# Patient Record
Sex: Female | Born: 1960 | Race: Black or African American | Hispanic: No | Marital: Married | Smoking: Current some day smoker
Health system: Southern US, Community
[De-identification: ages and names within clinical notes are randomized; demographics above are authoritative.]

## PROBLEM LIST (undated history)

## (undated) DIAGNOSIS — K921 Melena: Secondary | ICD-10-CM

## (undated) DIAGNOSIS — N259 Disorder resulting from impaired renal tubular function, unspecified: Secondary | ICD-10-CM

## (undated) DIAGNOSIS — E785 Hyperlipidemia, unspecified: Secondary | ICD-10-CM

## (undated) DIAGNOSIS — M5030 Other cervical disc degeneration, unspecified cervical region: Secondary | ICD-10-CM

## (undated) DIAGNOSIS — I10 Essential (primary) hypertension: Secondary | ICD-10-CM

## (undated) DIAGNOSIS — G4730 Sleep apnea, unspecified: Secondary | ICD-10-CM

## (undated) DIAGNOSIS — J019 Acute sinusitis, unspecified: Secondary | ICD-10-CM

## (undated) DIAGNOSIS — R740 Nonspecific elevation of levels of transaminase and lactic acid dehydrogenase [LDH]: Secondary | ICD-10-CM

## (undated) DIAGNOSIS — R609 Edema, unspecified: Secondary | ICD-10-CM

## (undated) DIAGNOSIS — N926 Irregular menstruation, unspecified: Secondary | ICD-10-CM

## (undated) DIAGNOSIS — M542 Cervicalgia: Secondary | ICD-10-CM

## (undated) DIAGNOSIS — N318 Other neuromuscular dysfunction of bladder: Secondary | ICD-10-CM

## (undated) DIAGNOSIS — F419 Anxiety disorder, unspecified: Secondary | ICD-10-CM

## (undated) HISTORY — DX: Nonspecific elevation of levels of transaminase and lactic acid dehydrogenase (ldh): R74.0

## (undated) HISTORY — DX: Acute sinusitis, unspecified: J01.90

## (undated) HISTORY — DX: Edema, unspecified: R60.9

## (undated) HISTORY — DX: Anxiety disorder, unspecified: F41.9

## (undated) HISTORY — DX: Hyperlipidemia, unspecified: E78.5

## (undated) HISTORY — DX: Melena: K92.1

## (undated) HISTORY — DX: Other neuromuscular dysfunction of bladder: N31.8

## (undated) HISTORY — DX: Irregular menstruation, unspecified: N92.6

## (undated) HISTORY — DX: Essential (primary) hypertension: I10

## (undated) HISTORY — DX: Disorder resulting from impaired renal tubular function, unspecified: N25.9

## (undated) HISTORY — DX: Other cervical disc degeneration, unspecified cervical region: M50.30

## (undated) HISTORY — DX: Cervicalgia: M54.2

---
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
## 1988-06-12 HISTORY — PX: TUBAL LIGATION: SHX77

## 2002-12-25 ENCOUNTER — Encounter: Payer: Self-pay | Admitting: Obstetrics

## 2002-12-25 ENCOUNTER — Ambulatory Visit (HOSPITAL_COMMUNITY): Admission: RE | Admit: 2002-12-25 | Discharge: 2002-12-25 | Payer: Self-pay | Admitting: Obstetrics

## 2005-08-24 ENCOUNTER — Ambulatory Visit: Payer: Self-pay | Admitting: Internal Medicine

## 2009-07-12 ENCOUNTER — Ambulatory Visit: Payer: Self-pay | Admitting: Internal Medicine

## 2009-07-12 ENCOUNTER — Ambulatory Visit (HOSPITAL_COMMUNITY): Admission: RE | Admit: 2009-07-12 | Discharge: 2009-07-12 | Payer: Self-pay | Admitting: Internal Medicine

## 2009-07-12 ENCOUNTER — Telehealth: Payer: Self-pay | Admitting: Internal Medicine

## 2009-07-12 ENCOUNTER — Encounter (INDEPENDENT_AMBULATORY_CARE_PROVIDER_SITE_OTHER): Payer: Self-pay | Admitting: *Deleted

## 2009-07-12 DIAGNOSIS — R740 Nonspecific elevation of levels of transaminase and lactic acid dehydrogenase [LDH]: Secondary | ICD-10-CM

## 2009-07-12 DIAGNOSIS — J019 Acute sinusitis, unspecified: Secondary | ICD-10-CM

## 2009-07-12 DIAGNOSIS — R7402 Elevation of levels of lactic acid dehydrogenase (LDH): Secondary | ICD-10-CM

## 2009-07-12 DIAGNOSIS — M542 Cervicalgia: Secondary | ICD-10-CM

## 2009-07-12 DIAGNOSIS — R7401 Elevation of levels of liver transaminase levels: Secondary | ICD-10-CM | POA: Insufficient documentation

## 2009-07-12 DIAGNOSIS — N318 Other neuromuscular dysfunction of bladder: Secondary | ICD-10-CM

## 2009-07-12 DIAGNOSIS — N259 Disorder resulting from impaired renal tubular function, unspecified: Secondary | ICD-10-CM

## 2009-07-12 DIAGNOSIS — I10 Essential (primary) hypertension: Secondary | ICD-10-CM | POA: Insufficient documentation

## 2009-07-12 HISTORY — DX: Elevation of levels of lactic acid dehydrogenase (LDH): R74.02

## 2009-07-12 HISTORY — DX: Other neuromuscular dysfunction of bladder: N31.8

## 2009-07-12 HISTORY — DX: Cervicalgia: M54.2

## 2009-07-12 HISTORY — DX: Essential (primary) hypertension: I10

## 2009-07-12 HISTORY — DX: Disorder resulting from impaired renal tubular function, unspecified: N25.9

## 2009-07-12 HISTORY — DX: Elevation of levels of liver transaminase levels: R74.01

## 2009-07-12 HISTORY — DX: Acute sinusitis, unspecified: J01.90

## 2009-07-13 ENCOUNTER — Encounter: Payer: Self-pay | Admitting: Internal Medicine

## 2009-07-13 DIAGNOSIS — M5030 Other cervical disc degeneration, unspecified cervical region: Secondary | ICD-10-CM

## 2009-07-13 HISTORY — DX: Other cervical disc degeneration, unspecified cervical region: M50.30

## 2009-07-14 ENCOUNTER — Encounter: Admission: RE | Admit: 2009-07-14 | Discharge: 2009-07-14 | Payer: Self-pay | Admitting: Internal Medicine

## 2009-07-14 ENCOUNTER — Telehealth: Payer: Self-pay | Admitting: Internal Medicine

## 2009-07-14 LAB — CONVERTED CEMR LAB
HCV Ab: NEGATIVE
Hep A IgM: NEGATIVE
Hep B C IgM: NEGATIVE
Hepatitis B Surface Ag: NEGATIVE

## 2009-07-15 ENCOUNTER — Ambulatory Visit: Payer: Self-pay | Admitting: Internal Medicine

## 2009-07-18 DIAGNOSIS — E785 Hyperlipidemia, unspecified: Secondary | ICD-10-CM

## 2009-07-18 HISTORY — DX: Hyperlipidemia, unspecified: E78.5

## 2009-08-24 ENCOUNTER — Ambulatory Visit: Payer: Self-pay | Admitting: Internal Medicine

## 2009-10-14 ENCOUNTER — Telehealth: Payer: Self-pay | Admitting: Internal Medicine

## 2009-10-26 ENCOUNTER — Telehealth (INDEPENDENT_AMBULATORY_CARE_PROVIDER_SITE_OTHER): Payer: Self-pay | Admitting: *Deleted

## 2009-10-29 ENCOUNTER — Telehealth (INDEPENDENT_AMBULATORY_CARE_PROVIDER_SITE_OTHER): Payer: Self-pay | Admitting: *Deleted

## 2009-11-04 ENCOUNTER — Telehealth (INDEPENDENT_AMBULATORY_CARE_PROVIDER_SITE_OTHER): Payer: Self-pay | Admitting: *Deleted

## 2009-11-30 ENCOUNTER — Ambulatory Visit: Payer: Self-pay | Admitting: Internal Medicine

## 2009-11-30 DIAGNOSIS — K921 Melena: Secondary | ICD-10-CM | POA: Insufficient documentation

## 2009-11-30 HISTORY — DX: Melena: K92.1

## 2010-01-28 ENCOUNTER — Encounter: Payer: Self-pay | Admitting: Internal Medicine

## 2010-07-10 LAB — CONVERTED CEMR LAB
ALT: 37 units/L — ABNORMAL HIGH (ref 0–35)
Albumin: 4.4 g/dL (ref 3.5–5.2)
Alkaline Phosphatase: 66 units/L (ref 39–117)
BUN: 6 mg/dL (ref 6–23)
Basophils Absolute: 0 10*3/uL (ref 0.0–0.1)
Bilirubin Urine: NEGATIVE
CO2: 29 meq/L (ref 19–32)
Calcium: 9.4 mg/dL (ref 8.4–10.5)
Eosinophils Absolute: 0.3 10*3/uL (ref 0.0–0.7)
Folate: 5.2 ng/mL
GFR calc non Af Amer: 34.38 mL/min (ref >60)
Glucose, Bld: 75 mg/dL (ref 70–99)
HCT: 32.4 % — ABNORMAL LOW (ref 36.0–46.0)
Iron: 52 ug/dL (ref 42–145)
Ketones, ur: NEGATIVE mg/dL
MCHC: 34.8 g/dL (ref 30.0–36.0)
Monocytes Absolute: 0.4 10*3/uL (ref 0.1–1.0)
Monocytes Relative: 9.7 % (ref 3.0–12.0)
Neutrophils Relative %: 49.2 % (ref 43.0–77.0)
RBC: 3.42 M/uL — ABNORMAL LOW (ref 3.87–5.11)
RDW: 13.6 % (ref 11.5–14.6)
Saturation Ratios: 10.6 % — ABNORMAL LOW (ref 20.0–50.0)
Total Bilirubin: 0.8 mg/dL (ref 0.3–1.2)
Total Protein, Urine: NEGATIVE mg/dL
Transferrin: 349.9 mg/dL (ref 212.0–360.0)
Urobilinogen, UA: 0.2 (ref 0.0–1.0)
WBC: 4.6 10*3/uL (ref 4.5–10.5)
pH: 5 (ref 5.0–8.0)

## 2010-07-14 NOTE — Assessment & Plan Note (Signed)
Summary: blood in stool/cd   Vital Signs:  Patient profile:   50 year old female Height:      64 inches Weight:      183 pounds BMI:     31.53 O2 Sat:      98 % on Room air Temp:     100.5 degrees F oral Pulse rate:   110 / minute BP sitting:   192 / 104  (left arm) Cuff size:   regular  Vitals Entered By: Margaret Pyle, CMA (November 30, 2009 3:01 PM)  O2 Flow:  Room air CC: Blood in stool x 9 days, no abd pain. Pain Assessment Patient in pain? no        CC:  Blood in stool x 9 days and no abd pain.Marland Kitchen  History of Present Illness: here with intermittent  BRBR , started with small spots to start, then somewhat larger amounts with BM's ; one BM "kind of loose" with more blood than stool;  no pain except last wk before onset BRB she had constipatoin relieved with laxativel did have to strain with BM at that time;  has been outside today with heat index of 105 ;  BP has been well controlled until this episode; Pt denies CP, sob, doe, wheezing, orthopnea, pnd, worsening LE edema, palps, dizziness or syncope   Pt denies new neuro symptoms such as headache, facial or extremity weakness Overall good compliance with meds, good med tolerance.    Problems Prior to Update: 1)  Hematochezia  (ICD-578.1) 2)  Hyperlipidemia  (ICD-272.4) 3)  Disc Disease, Cervical  (ICD-722.4) 4)  Transaminases, Serum, Elevated  (ICD-790.4) 5)  Renal Insufficiency  (ICD-588.9) 6)  Neck Pain  (ICD-723.1) 7)  Sinusitis- Acute-nos  (ICD-461.9) 8)  Preventive Health Care  (ICD-V70.0) 9)  Overactive Bladder  (ICD-596.51) 10)  Hypertension  (ICD-401.9)  Medications Prior to Update: 1)  Lisinopril 40 Mg Tabs (Lisinopril) .Marland Kitchen.. 1po Once Daily 2)  Amlodipine Besylate 5 Mg Tabs (Amlodipine Besylate) .Marland Kitchen.. 1 By Mouth Once Daily 3)  Tramadol Hcl 50 Mg Tabs (Tramadol Hcl) .Marland Kitchen.. 1 By Mouth Four Times Per Day As Needed 4)  Aspir-Low 81 Mg Tbec (Aspirin) .Marland Kitchen.. 1po Once Daily  Current Medications (verified): 1)   Lisinopril 40 Mg Tabs (Lisinopril) .Marland Kitchen.. 1po Once Daily 2)  Amlodipine Besylate 5 Mg Tabs (Amlodipine Besylate) .Marland Kitchen.. 1 By Mouth Once Daily 3)  Tramadol Hcl 50 Mg Tabs (Tramadol Hcl) .Marland Kitchen.. 1 By Mouth Four Times Per Day As Needed 4)  Aspir-Low 81 Mg Tbec (Aspirin) .Marland Kitchen.. 1po Once Daily 5)  Anusol-Hc 25 Mg Supp (Hydrocortisone Acetate) .Marland Kitchen.. 1 Pr Two Times A Day For 10 Days  Allergies (verified): No Known Drug Allergies  Past History:  Past Medical History: Last updated: 07/15/2009 Hypertension OAB Hyperlipidemia cervical disc disease Renal insufficiency  Past Surgical History: Last updated: 07/12/2009 s/p ganglion cyst mid 20's Tubal ligation  Social History: Last updated: 07/12/2009 work - CNA Former Smoker - none since 2002 Alcohol use-yes - social  Drug use-no Married 2 children  Risk Factors: Smoking Status: quit (07/12/2009)  Review of Systems       all otherwise negative per pt -    Physical Exam  General:  alert and overweight-appearing.   Head:  normocephalic and atraumatic.   Eyes:  vision grossly intact, pupils equal, and pupils round.   Ears:  R ear normal and L ear normal.   Nose:  no external deformity and no nasal discharge.   Mouth:  no gingival abnormalities and pharynx pink and moist.   Neck:  supple and no masses.   Lungs:  normal respiratory effort and normal breath sounds.   Heart:  normal rate and regular rhythm.   Rectal:  several hemorrhoidal tags, small fissure noted, no active bleeding, heme neg Extremities:  no edema, no erythema    Impression & Recommendations:  Problem # 1:  HEMATOCHEZIA (ICD-578.1)  painless but likely related to hemorroid vs fissure;  treat as above, f/u any worsening signs or symptoms , also for colonoscopy as malignancy cannot be ruled out  Orders: Gastroenterology Referral (GI)  Problem # 2:  HYPERTENSION (ICD-401.9)  Her updated medication list for this problem includes:    Lisinopril 40 Mg Tabs  (Lisinopril) .Marland Kitchen... 1po once daily    Amlodipine Besylate 5 Mg Tabs (Amlodipine besylate) .Marland Kitchen... 1 by mouth once daily overall  stable overall by hx and exam, ok to continue meds/tx as is   BP today: 192/104 Prior BP: 180/126 (08/24/2009)  Labs Reviewed: K+: 3.8 (07/12/2009) Creat: : 2.0 (07/12/2009)   Chol: 330 (07/12/2009)   HDL: 91.30 (07/12/2009)   TG: 205.0 (07/12/2009)  Complete Medication List: 1)  Lisinopril 40 Mg Tabs (Lisinopril) .Marland Kitchen.. 1po once daily 2)  Amlodipine Besylate 5 Mg Tabs (Amlodipine besylate) .Marland Kitchen.. 1 by mouth once daily 3)  Tramadol Hcl 50 Mg Tabs (Tramadol hcl) .Marland Kitchen.. 1 by mouth four times per day as needed 4)  Aspir-low 81 Mg Tbec (Aspirin) .Marland Kitchen.. 1po once daily 5)  Anusol-hc 25 Mg Supp (Hydrocortisone acetate) .Marland Kitchen.. 1 pr two times a day for 10 days  Patient Instructions: 1)  Please take all new medications as prescribed  2)  Continue all previous medications as before this visit  3)  You will be contacted about the referral(s) to: colonoscopy 4)  Please schedule a follow-up appointment in Jan 2012 with CPX labs 5)  Check your Blood Pressure regularly. If it is above 140/90: you should make an appointment. Prescriptions: ANUSOL-HC 25 MG SUPP (HYDROCORTISONE ACETATE) 1 pr two times a day for 10 days  #20 x 0   Entered and Authorized by:   Corwin Levins MD   Signed by:   Corwin Levins MD on 11/30/2009   Method used:   Print then Give to Patient   RxID:   [id number]

## 2010-07-14 NOTE — Assessment & Plan Note (Signed)
Summary: NECK IS SWOLLEN/ RE-EST/ [employer]/ LOV 2007 /NWS   Vital Signs:  Patient profile:   50 year old female Height:      66 inches Weight:      186 pounds BMI:     30.13 O2 Sat:      98 % on Room air Temp:     97.7 degrees F oral Pulse rate:   113 / minute BP sitting:   172 / 98  (left arm) Cuff size:   regular  Vitals Entered ByZella Ball Ewing (July 12, 2009 2:32 PM)  O2 Flow:  Room air  Preventive Care Screening     OK for flou shot  CC: new pt, swollen neck/RE   CC:  new pt and swollen neck/RE.  History of Present Illness: here with ongoing am dizziness and lightheaded for a 2 wks, c/o  sinus pain and has some discomfort and headache - has been ongoing for several months low grade and OTC helps only a day or two, now no longer working ; has fever, but no St, cough, Pt denies CP, sob, doe, wheezing, orthopnea, pnd, worsening LE edema, palps, dizziness or syncope  Pt denies new neuro symptoms such as facial or extremity weakness but has had RUE intermittent paresthesias  without weakness or pain.  May have some polyuria and notruai about 2 to 3 or more times at night.  No dysuria, and occasional slight wetting when cant get to the BR quickly enough.  Used to take med for OAB.  BP check last friday aproox 150 sbp.  Was on diovan 160 mg which did not control pressure in 2007.  Lost to f/u since then.     Preventive Screening-Counseling & Management  Alcohol-Tobacco     Smoking Status: quit      Drug Use:  no.    Problems Prior to Update: 1)  Transaminases, Serum, Elevated  (ICD-790.4) 2)  Renal Insufficiency  (ICD-588.9) 3)  Neck Pain  (ICD-723.1) 4)  Sinusitis- Acute-nos  (ICD-461.9) 5)  Preventive Health Care  (ICD-V70.0) 6)  Overactive Bladder  (ICD-596.51) 7)  Hypertension  (ICD-401.9)  Medications Prior to Update: 1)  None  Current Medications (verified): 1)  Doxycycline Hyclate 100 Mg Caps (Doxycycline Hyclate) .Marland Kitchen.. 1 Po Two Times A Day 2)  Benazepril Hcl 40  Mg Tabs (Benazepril Hcl) .Marland Kitchen.. 1 By Mouth Once Daily 3)  Amlodipine Besylate 5 Mg Tabs (Amlodipine Besylate) .Marland Kitchen.. 1 By Mouth Once Daily 4)  Vesicare 5 Mg Tabs (Solifenacin Succinate) .Marland Kitchen.. 1po Once Daily  Allergies (verified): No Known Drug Allergies  Past History:  Family History: Last updated: 07/12/2009 mother with HTN aunt with DM grandmother died with MI at 56yo  Social History: Last updated: 07/12/2009 work - CNA Former Smoker - none since 2002 Alcohol use-yes - social  Drug use-no Married 2 children  Risk Factors: Smoking Status: quit (07/12/2009)  Past Medical History: Hypertension OAB  Past Surgical History: s/p ganglion cyst mid 20's Tubal ligation  Family History: Reviewed history and no changes required. mother with HTN aunt with DM grandmother died with MI at 85yo  Social History: Reviewed history and no changes required. work - Lawyer Former Smoker - none since 2002 Alcohol use-yes - social  Drug use-no Married 2 children Smoking Status:  quit Drug Use:  no  Review of Systems  The patient denies anorexia, weight loss, weight gain, vision loss, decreased hearing, hoarseness, chest pain, syncope, dyspnea on exertion, peripheral edema, prolonged cough, hemoptysis, abdominal pain,  melena, hematochezia, severe indigestion/heartburn, hematuria, incontinence, muscle weakness, suspicious skin lesions, transient blindness, difficulty walking, unusual weight change, abnormal bleeding, enlarged lymph nodes, and angioedema.         all otherwise negative per pt -  Physical Exam  General:  alert and overweight-appearing.  , mild ill  Head:  normocephalic and atraumatic.   Eyes:  vision grossly intact, pupils equal, and pupils round.   Ears:  bilat tm's red, sinus tender bilat Nose:  nasal dischargemucosal pallor and mucosal erythema.   Mouth:  pharyngeal erythema and fair dentition.   Neck:  supple and cervical lymphadenopathy.   Lungs:  normal  respiratory effort and normal breath sounds.   Heart:  normal rate and regular rhythm.   Abdomen:  soft, non-tender, and normal bowel sounds.   Msk:  no joint tenderness and no joint swelling.   Extremities:  no edema, no erythema  Neurologic:  cranial nerves II-XII intact, strength normal in all extremities, and sensation intact to light touch.     Impression & Recommendations:  Problem # 1:  Preventive Health Care (ICD-V70.0)  Overall doing well, age appropriate education and counseling updated and referral for appropriate preventive services done unless declined, immunizations up to date or declined, diet counseling done if overweight, urged to quit smoking if smokes , most recent labs reviewed and current ordered if appropriate, ecg reviewed or declined (interpretation per ECG scanned in the EMR if done); information regarding Medicare Prevention requirements given if appropriate   Orders: TLB-BMP (Basic Metabolic Panel-BMET) (80048-METABOL) TLB-CBC Platelet - w/Differential (85025-CBCD) TLB-Hepatic/Liver Function Pnl (80076-HEPATIC) TLB-Lipid Panel (80061-LIPID) TLB-TSH (Thyroid Stimulating Hormone) (84443-TSH)  Problem # 2:  HYPERTENSION (ICD-401.9)  Her updated medication list for this problem includes:    Benazepril Hcl 40 Mg Tabs (Benazepril hcl) .Marland Kitchen... 1 by mouth once daily    Amlodipine Besylate 5 Mg Tabs (Amlodipine besylate) .Marland Kitchen... 1 by mouth once daily treat as above, f/u any worsening signs or symptoms , f/u 3 wks  Problem # 3:  OVERACTIVE BLADDER (ICD-596.51)  to  check urine studies, but to try vesicare 5 mg per day  Orders: T-Culture, Urine (33295-18841) TLB-Udip w/ Micro (81001-URINE)  Problem # 4:  SINUSITIS- ACUTE-NOS (ICD-461.9)  Orders: T-Sinuses Complete (70220TC)  Her updated medication list for this problem includes:    Doxycycline Hyclate 100 Mg Caps (Doxycycline hyclate) .Marland Kitchen... 1 po two times a day treat as above, f/u any worsening signs or  symptoms  Problem # 5:  NECK PAIN (ICD-723.1)  Has  rather large acute onset low post cervical spine swollen tender area about 4 cm, nondiscrete and no drainage, and few but significant symptoms of RUE paresthesias (? related to work); I suspect by exam more likelya  benign type of rather large cystic subq mass or lymph node swelling related to the acute infection above  but with tenderness, fever , obvious  site of obvious confirmed infection (sinus) on exam  as well as the intermittent RUE parethesias (although exam benign) she could potentially have early abscess paravertebral;  will ask for c-spine MRI asap to further assess;  pt to return immediately with any worsening s/s, or go to ER  Orders: Radiology Referral (Radiology)  Complete Medication List: 1)  Doxycycline Hyclate 100 Mg Caps (Doxycycline hyclate) .Marland Kitchen.. 1 po two times a day 2)  Benazepril Hcl 40 Mg Tabs (Benazepril hcl) .Marland Kitchen.. 1 by mouth once daily 3)  Amlodipine Besylate 5 Mg Tabs (Amlodipine besylate) .Marland Kitchen.. 1 by mouth once daily  4)  Vesicare 5 Mg Tabs (Solifenacin succinate) .Marland Kitchen.. 1po once daily  Other Orders: Admin 1st Vaccine (11914) Flu Vaccine 28yrs + [phone]) Tdap => 36yrs IM (62130) Admin of Any Addtl Vaccine (86578)  Patient Instructions: 1)  you had the flu shot today, and tetanus shots 2)  please call for your yearly pap smear and mammogram 3)  your EKG was good today 4)  Please go to the Lab in the basement for your blood and/or urine tests today 5)  Please go to Radiology in the basement level for your X-Ray today  6)  You will be contacted about the referral(s) to: MRI for the c-spine  - to see PCC's today 7)  Please take all new medications as prescribed 8)  Continue all previous medications as before this visit  9)  Followup immediately with any worsening signs such as increased pain, swelling, fever, chills, or extremity numb, weak, or pain, change in bowel or bladder or trouble walking 10)  otherwise - f/u in  3 wks for BP check Prescriptions: VESICARE 5 MG TABS (SOLIFENACIN SUCCINATE) 1po once daily  #90 x 3   Entered and Authorized by:   Corwin Levins MD   Signed by:   Corwin Levins MD on 07/12/2009   Method used:   Print then Give to Patient   RxID:   [id number] VESICARE 5 MG TABS (SOLIFENACIN SUCCINATE) 1po once daily  #30 x 11   Entered and Authorized by:   Corwin Levins MD   Signed by:   Corwin Levins MD on 07/12/2009   Method used:   Print then Give to Patient   RxID:   [id number] AMLODIPINE BESYLATE 5 MG TABS (AMLODIPINE BESYLATE) 1 by mouth once daily  #90 x 3   Entered and Authorized by:   Corwin Levins MD   Signed by:   Corwin Levins MD on 07/12/2009   Method used:   Print then Give to Patient   RxID:   [id number] BENAZEPRIL HCL 40 MG TABS (BENAZEPRIL HCL) 1 by mouth once daily  #90 x 3   Entered and Authorized by:   Corwin Levins MD   Signed by:   Corwin Levins MD on 07/12/2009   Method used:   Print then Give to Patient   RxID:   [id number] DOXYCYCLINE HYCLATE 100 MG CAPS (DOXYCYCLINE HYCLATE) 1 po two times a day  #20 x 0   Entered and Authorized by:   Corwin Levins MD   Signed by:   Corwin Levins MD on 07/12/2009   Method used:   Print then Give to Patient   RxID:   [id number]     Flu Vaccine Consent Questions     Do you have a history of severe allergic reactions to this vaccine? no    Any prior history of allergic reactions to egg and/or gelatin? no    Do you have a sensitivity to the preservative Thimersol? no    Do you have a past history of Guillan-Barre Syndrome? no    Do you currently have an acute febrile illness? no    Have you ever had a severe reaction to latex? no    Vaccine information given and explained to patient? yes    Are you currently pregnant? no    Lot Number:[id number]   Exp Date:12/09/2009   Site Given  Left Deltoid IMflu  Immunizations Administered:  Tetanus Vaccine:    Vaccine Type: Tdap  Site: left deltoid     Mfr: GlaxoSmithKline    Dose: 0.5 ml    Route: IM    Given by: Robin Ewing    Exp. Date: 08/07/2011    Lot #: [id number]    VIS given: 04/30/07 version given July 12, 2009.

## 2010-07-14 NOTE — Progress Notes (Signed)
  Phone Note Other Incoming   Request: Send information Summary of Call: Request for records received from [facility] [employer]. Request forwarded to [employer].

## 2010-07-14 NOTE — Progress Notes (Signed)
  Phone Note Other Incoming   Caller: Marcell Anger, with [facility] Request: Send information Action Taken: Information Sent Summary of Call: The requested and received records on the patient; however they felt they were  missing office notes.  The hearing is set for 11/03/09 and info. is needed right away for review.    Shawnice verified each copy they had received as I checked it against the chart. I  determined that they were missing the 07/12/09 office note.  I  got her fax & phone number and asked her to call me back to verify when she received the faxed copies. I called the number on the letter since the phone & fax number [facility] gave me didn't match. The receptionist verified that Ma Hillock worked there and gave me the same direct numbers that [facility] had given me.  I printed the letter & authorization form and the office note from 07/12/09 which ended up being 8 pages total with the fax cover and faxed these.   Shawnice called at 4:05 and confirmed receipt.

## 2010-07-14 NOTE — Progress Notes (Signed)
----   Converted from flag ---- ---- 10/14/2009 11:48 AM, Corwin Levins MD wrote: noted  ---- 10/14/2009 11:48 AM, Shelbie Proctor wrote:  [facility]  states pt canceled appt twice  for new pt consult- march 14,2011 pt canceld appt . rescheduled her again for may 4,2011 which she canceled this appt .[phone] crystal just want to infom dr [name] of this it was for renal insufficiency back in 07-12-2009 Pt states she was going threw hard times ------------------------------

## 2010-07-14 NOTE — Progress Notes (Signed)
  Phone Note Other Incoming   Request: Send information Summary of Call: Request received from [facility] of [facility] Services forwarded to [employer].

## 2010-07-14 NOTE — Letter (Signed)
Summary: Generic Letter  [facility]  [address] [place], [place] [address]   Phone: [phone]  Fax: [phone]    07/15/2009  KIENNA MONCADA [address] [place], [place]  [address]  Dear Ms. Vandenbosch,       Please allow no pushing, pulling or lifting/carrying  more than 20 lbs, for at least 3 weeks.         Sincerely,   Oliver Barre MD

## 2010-07-14 NOTE — Progress Notes (Signed)
Summary: LABS  Phone Note From Other Clinic   Summary of Call: STAT LAB FOR MRI w/Contrast: BUN 6 Creat 2.0 (high) Dr [name] informed. Radiology informed. Radiology will not do w/contrast b/c GFR is 33. Scan w/o contrast will show edema and fluid collection per radiologist and abcess will be able to be r/o. Dr [name] aware.  Initial call taken by: Lamar Sprinkles, CMA,  July 12, 2009 5:31 PM  Follow-up for Phone Call        ok for non contrast MRI Follow-up by: Corwin Levins MD,  July 12, 2009 5:44 PM

## 2010-07-14 NOTE — Progress Notes (Signed)
  Phone Note Call from Patient   Caller: Patient Summary of Call: Patient called and requested copy of her labs, in which I did send her a copy. Also she would like to get started on her medications and said they could be sent to [facility]. Initial call taken by: Scharlene Gloss,  July 14, 2009 3:25 PM  Follow-up for Phone Call        she can use the rx given at her OV as all refills were just given Follow-up by: Corwin Levins MD,  July 14, 2009 4:12 PM      called pt to inform of above information
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
## 2010-07-14 NOTE — Letter (Signed)
Summary: Out of Work  [facility]  [address] [place], [place] [address]   Phone: [phone]  Fax: [phone]    July 12, 2009   Employee:  DREW HERMAN    To Whom It May Concern:   For Medical reasons, please excuse the above named employee from work for the following dates:  Start:   07/12/2009  End:   07/14/2009  If you need additional information, please feel free to contact our office.         Sincerely,    Dr Oliver Barre

## 2010-07-14 NOTE — Letter (Signed)
Summary: Referral - not able to see patient  [facility]  [address] [place], [place] [address]   Phone: [phone]  Fax: [phone]    January 28, 2010    Corwin Levins, M.D. [address] [place], [place] [address]   Re:   Lori Oconnell DOB:  [birth year] MRN:   [id number]    Dear Dr. [name]:  Thank you for your kind referral of the above patient.  We have attempted to schedule the recommended procedure Screening Colonoscopy but have not been able to schedule because:   X  The patient was not available by phone and/or has not returned our calls.  ___ The patient declined to schedule the procedure at this time.  We appreciate the referral and hope that we will have the opportunity to treat this patient in the future.    Sincerely,    [facility] [phone]

## 2010-07-14 NOTE — Progress Notes (Signed)
Summary: Work note  Phone Note Call from Patient Call back at [employer] [phone]   Caller: Patient Summary of Call: pt is requesting a work note for January 31 thru February 2nd. pt would also like to know what she should do as far as returning to work with her neck injury. I advised pt that she was referred to NS for neck and she should discuss with them but I will ask MD since she does not have an appt yet. please advise Initial call taken by: Margaret Pyle, CMA,  July 14, 2009 2:06 PM  Follow-up for Phone Call        I printed the work note already given at the OV again;  there are no new recommendations;  and activity at work should be as tolerated, Follow-up by: Corwin Levins MD,  July 14, 2009 4:03 PM  Additional Follow-up for Phone Call Additional follow up Details #1::        pt informed of work note. told to call back for the rest of MD's recommendations Additional Follow-up by: Margaret Pyle, CMA,  July 14, 2009 4:13 PM    Additional Follow-up for Phone Call Additional follow up Details #2::    pt informed by Robin Follow-up by: Margaret Pyle, CMA,  July 14, 2009 4:22 PM

## 2010-07-14 NOTE — Miscellaneous (Signed)
Summary: Orders Update   Clinical Lists Changes  Problems: Added new problem of DISC DISEASE, CERVICAL (ICD-722.4) Orders: Added new Referral order of Neurosurgeon Referral (Neurosurgeon) - Signed

## 2010-07-14 NOTE — Assessment & Plan Note (Signed)
Summary: DISCUSS GOING TO WORK/NWS   Vital Signs:  Patient profile:   50 year old female Height:      64 inches Weight:      185 pounds BMI:     31.87 O2 Sat:      98 % on Room air Temp:     98.3 degrees F oral Pulse rate:   110 / minute BP sitting:   150 / 90  (left arm) Cuff size:   regular  Vitals Entered ByZella Ball Ewing (July 15, 2009 1:49 PM)  O2 Flow:  Room air CC: Discuss going back to work/RE   CC:  Discuss going back to work/RE.  History of Present Illness: has appt with dr Dutch Quint thur feb 10;  confused about meds and thought with her liver test elev she was not to take any meds (when I had mentioned only not taking as statin due to liver prob); so has held on taking the BP meds as well.  Pt denies CP, sob, doe, wheezing, orthopnea, pnd, worsening LE edema, palps, dizziness or syncope  Pt denies new neuro symptoms such as headache, facial or extremity weakness   No GI symptoms such as abd pain, n/v, or diarrhea.  Neck pain persists and is very afraid to work in that she feels she cannot lift or carry much wt as might aggrevate the pain.  Overall pain level no change, no new fever, wt loss, night sweats, bowel or bladder change, or extremity weakn, numb, or pain, or gait problem.    Problems Prior to Update: 1)  Disc Disease, Cervical  (ICD-722.4) 2)  Transaminases, Serum, Elevated  (ICD-790.4) 3)  Renal Insufficiency  (ICD-588.9) 4)  Neck Pain  (ICD-723.1) 5)  Sinusitis- Acute-nos  (ICD-461.9) 6)  Preventive Health Care  (ICD-V70.0) 7)  Overactive Bladder  (ICD-596.51) 8)  Hypertension  (ICD-401.9)  Medications Prior to Update: 1)  Doxycycline Hyclate 100 Mg Caps (Doxycycline Hyclate) .Marland Kitchen.. 1 Po Two Times A Day 2)  Benazepril Hcl 40 Mg Tabs (Benazepril Hcl) .Marland Kitchen.. 1 By Mouth Once Daily 3)  Amlodipine Besylate 5 Mg Tabs (Amlodipine Besylate) .Marland Kitchen.. 1 By Mouth Once Daily 4)  Vesicare 5 Mg Tabs (Solifenacin Succinate) .Marland Kitchen.. 1po Once Daily  Current Medications  (verified): 1)  Doxycycline Hyclate 100 Mg Caps (Doxycycline Hyclate) .Marland Kitchen.. 1 Po Two Times A Day 2)  Labetalol Hcl 200 Mg Tabs (Labetalol Hcl) .Marland Kitchen.. 1 By Mouth Two Times A Day 3)  Amlodipine Besylate 5 Mg Tabs (Amlodipine Besylate) .Marland Kitchen.. 1 By Mouth Once Daily 4)  Vesicare 5 Mg Tabs (Solifenacin Succinate) .Marland Kitchen.. 1po Once Daily 5)  Tramadol Hcl 50 Mg Tabs (Tramadol Hcl) .Marland Kitchen.. 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 Smoker - none since 2002 Alcohol use-yes - social  Drug use-no Married 2 children  Risk Factors: Smoking Status: quit (07/12/2009)  Past Medical History: Hypertension OAB Hyperlipidemia cervical disc disease Renal insufficiency  Review of Systems  The patient denies anorexia, fever, vision loss, decreased hearing, hoarseness, chest pain, syncope, dyspnea on exertion, peripheral edema, prolonged cough, headaches, hemoptysis, abdominal pain, melena, hematochezia, severe indigestion/heartburn, hematuria, incontinence, muscle weakness, suspicious skin lesions, difficulty walking, unusual weight change, abnormal bleeding,  enlarged lymph nodes, and angioedema.         all otherwise negative per pt -   Physical Exam  General:  alert and overweight-appearing.   Head:  normocephalic and atraumatic.   Eyes:  vision grossly intact, pupils equal, and pupils round.   Ears:  R ear normal and L ear normal.   Nose:  no external deformity and no nasal discharge.   Mouth:  no gingival abnormalities and pharynx pink and moist.   Neck:  supple and no masses.   Lungs:  normal respiratory effort and normal breath sounds.   Heart:  normal rate and regular rhythm.    Abdomen:  soft, non-tender, and normal bowel sounds.   Msk:  no joint tenderness and no joint swelling.  , no spine tender Extremities:  no edema, no erythema  Neurologic:  cranial nerves II-XII intact, strength normal in all extremities, sensation intact to light touch, and gait normal.     Impression & Recommendations:  Problem # 1:  NECK PAIN (ICD-723.1)  Her updated medication list for this problem includes:    Tramadol Hcl 50 Mg Tabs (Tramadol hcl) .Marland Kitchen... 1 by mouth four times per day as needed treat as above, f/u any worsening signs or symptoms , no change, f/u with NS as planned, gave note for light duty  it should be noted that > 45 min spent on this exam with hx, PE, reivew of EMR results with pt, and discussion/education regarding options for furhter eval and treatment; work note also given  Problem # 2:  TRANSAMINASES, SERUM, ELEVATED (ICD-790.4) recent u/s neg,   consider GI eval , currently asympt, has fatty liver by u/s  Problem # 3:  HYPERLIPIDEMIA (ICD-272.4)  Labs Reviewed: SGOT: 84 (07/12/2009)   SGPT: 37 (07/12/2009)   HDL:91.30 (07/12/2009)  Chol:330 (07/12/2009)  Trig:205.0 (07/12/2009) severe, she hesitated to tx so to hold on statin for now given other new meds and LFT's as above; Pt to continue diet efforts, consider statin next visit - goal LDL less than 70 due to renal insufficiency  Problem # 4:  RENAL INSUFFICIENCY (ICD-588.9) suspecct may be related to HTN - refer renal  Problem # 5:  HYPERTENSION (ICD-401.9)  Her updated medication list for this problem includes:    Labetalol Hcl 200 Mg Tabs (Labetalol hcl) .Marland Kitchen... 1 by mouth two times a day    Amlodipine Besylate 5 Mg Tabs (Amlodipine besylate) .Marland Kitchen... 1 by mouth once daily treat as above, f/u any worsening signs or symptoms , change the ace to labetolol to not worsen the Renal insuff and better BP  Complete Medication List: 1)  Doxycycline Hyclate 100 Mg Caps (Doxycycline hyclate) .Marland Kitchen.. 1 po two times  a day 2)  Labetalol Hcl 200 Mg Tabs (Labetalol hcl) .Marland Kitchen.. 1 by mouth two times a day 3)  Amlodipine Besylate 5 Mg Tabs (Amlodipine besylate) .Marland Kitchen.. 1 by mouth once daily 4)  Vesicare 5 Mg Tabs (Solifenacin succinate) .Marland Kitchen.. 1po once daily 5)  Tramadol Hcl 50 Mg Tabs (Tramadol hcl) .Marland Kitchen.. 1 by mouth four times per day as needed  Patient Instructions: 1)  please take all meds as prescribed (including the pain medication), except the benazepril as this can make your kidneys more slowed down 2)  instead of the benazepril, take the labetolol twice per day for blood pressure 3)  we will need to hold off on the Cholesterol medication ONLY at this point due to the liver numbers mild elevated 4)  please follow  a strict low cholesterol diet 5)  we can refer you to dietary clinic if you like;  please let us know if you would like this 6)  Please keep your appt with Dr Dutch Quint as planned 7)  You are given the note for light duty 8)  You will be contacted about the referral(s) to: Kidney doctor 9)  Please schedule a follow-up appointment in 3 weeks with: 10)  BMP prior to visit, ICD-9: 585.2 11)  Hepatic Panel prior to visit, ICD-9: 58.69 12)  CBC w/ Diff prior to visit, ICD-9: 285.9 Prescriptions: LABETALOL HCL 200 MG TABS (LABETALOL HCL) 1 by mouth two times a day  #60 x 11   Entered and Authorized by:   Corwin Levins MD   Signed by:   Corwin Levins MD on 07/15/2009   Method used:   Print then Give to Patient   RxID:   [id number] TRAMADOL HCL 50 MG TABS (TRAMADOL HCL) 1 by mouth four times per day as needed  #120 x 2   Entered and Authorized by:   Corwin Levins MD   Signed by:   Corwin Levins MD on 07/15/2009   Method used:   Print then Give to Patient   RxID:   [id number]

## 2011-03-10 ENCOUNTER — Ambulatory Visit: Payer: Self-pay | Admitting: Internal Medicine

## 2011-03-10 ENCOUNTER — Telehealth: Payer: Self-pay

## 2011-03-10 DIAGNOSIS — Z0289 Encounter for other administrative examinations: Secondary | ICD-10-CM

## 2011-03-10 NOTE — Telephone Encounter (Signed)
Patient left form to be completed by MD. Lori Oconnell informed patient would need OV to complete form. I called the numbers in the patients chart to try and schedule OV, both numbers are not working.

## 2011-03-19 ENCOUNTER — Encounter: Payer: Self-pay | Admitting: Internal Medicine

## 2011-03-19 DIAGNOSIS — Z0000 Encounter for general adult medical examination without abnormal findings: Secondary | ICD-10-CM | POA: Insufficient documentation

## 2011-03-22 ENCOUNTER — Encounter: Payer: Self-pay | Admitting: Internal Medicine

## 2011-03-22 ENCOUNTER — Ambulatory Visit (INDEPENDENT_AMBULATORY_CARE_PROVIDER_SITE_OTHER): Payer: Self-pay | Admitting: Internal Medicine

## 2011-03-22 VITALS — BP 152/90 | HR 117 | Temp 98.2°F | Ht 65.0 in | Wt 173.0 lb

## 2011-03-22 DIAGNOSIS — Z23 Encounter for immunization: Secondary | ICD-10-CM

## 2011-03-22 DIAGNOSIS — N926 Irregular menstruation, unspecified: Secondary | ICD-10-CM

## 2011-03-22 DIAGNOSIS — M542 Cervicalgia: Secondary | ICD-10-CM

## 2011-03-22 DIAGNOSIS — R609 Edema, unspecified: Secondary | ICD-10-CM

## 2011-03-22 DIAGNOSIS — I10 Essential (primary) hypertension: Secondary | ICD-10-CM

## 2011-03-22 HISTORY — DX: Irregular menstruation, unspecified: N92.6

## 2011-03-22 HISTORY — DX: Edema, unspecified: R60.9

## 2011-03-22 MED ORDER — PREDNISONE 10 MG PO TABS: ORAL_TABLET | ORAL | Status: DC

## 2011-03-22 MED ORDER — TRAMADOL HCL 50 MG PO TABS: 50.0000 mg | ORAL_TABLET | Freq: Four times a day (QID) | ORAL | Status: DC | PRN

## 2011-03-22 MED ORDER — AMLODIPINE BESYLATE 5 MG PO TABS: 5.0000 mg | ORAL_TABLET | Freq: Every day | ORAL | Status: DC

## 2011-03-22 NOTE — Assessment & Plan Note (Addendum)
uncontrollked - to re-start the amlodipine 5 qd, hold on ACE or other at this time

## 2011-03-22 NOTE — Progress Notes (Signed)
Subjective:    Patient ID: Lori Oconnell, female    DOB: 12/24/1960, 50 y.o.   MRN: [id number]  HPI  Here to f/u;  No insurance today and does not want signficant evaluation involving more cost;  C/o pain to the post neck with some radiation to the right upper back/trapezoid area, but without change in severity for 4 wks it has been present, no bowel or bladder change, fever, wt loss,  worsening LE pain/numbness/weakness, gait change or falls.  Pain actually stable to improved and mild at best in the past wk, but also with rather dramatic diffuse nontender nonerythematous swelling to the facies, neck, and supraclavic areas, without tongue swelling, rash and Pt denies chest pain, increased sob or doe, wheezing, orthopnea, PND, increased LE swelling, palpitations, dizziness or syncope, except for mild DOE more than usual with 1 flight of stairs.  Pt denies new neurological symptoms such as new headache, or facial or extremity weakness or numbness   Pt denies polydipsia, polyuria.   Pt denies fever, wt loss, night sweats, loss of appetite, or other constitutional symptoms Has not been taking the ACE recently and no known food allergy or OTC meds recently.  Also with several months irreg menses, and needs recommendation for GYN Past Medical History  Diagnosis Date  . DISC DISEASE, CERVICAL 07/13/2009  . HEMATOCHEZIA 11/30/2009  . HYPERLIPIDEMIA 07/18/2009  . HYPERTENSION 07/12/2009  . NECK PAIN 07/12/2009  . OVERACTIVE BLADDER 07/12/2009  . RENAL INSUFFICIENCY 07/12/2009  . SINUSITIS- ACUTE-NOS 07/12/2009  . TRANSAMINASES, SERUM, ELEVATED 07/12/2009  . Edema 03/22/2011  . Irregular menses 03/22/2011   Past Surgical History  Procedure Date  . S/p ganglion cyst   . Tubal ligation     reports that she has quit smoking. She does not have any smokeless tobacco history on file. She reports that she drinks alcohol. She reports that she does not use illicit drugs. family history includes Diabetes in her maternal  aunt and Hypertension in her mother. No Known Allergies Current Outpatient Prescriptions on File Prior to Visit  Medication Sig Dispense Refill  . aspirin 81 MG tablet Take 81 mg by mouth daily.         Review of Systems Review of Systems  Constitutional: Negative for diaphoresis and unexpected weight change.  HENT: Negative for drooling and tinnitus.   Eyes: Negative for photophobia and visual disturbance.  Respiratory: Negative for choking and stridor.   Gastrointestinal: Negative for vomiting and blood in stool.  Genitourinary: Negative for hematuria and decreased urine volume.  Musculoskeletal: Negative for gait problem.  Skin: Negative for color change and wound.  Neurological: Negative for tremors and numbness.  Psychiatric/Behavioral: Negative for decreased concentration. The patient is not hyperactive.       Objective:   Physical Exam BP 152/90  Pulse 117  Temp(Src) 98.2 F (36.8 C) (Oral)  Ht 5\' 5"  (1.651 m)  Wt 173 lb (78.472 kg)  BMI 28.79 kg/m2  SpO2 99%  LMP 03/19/2011 Physical Exam  VS noted Constitutional: Pt appears well-developed and well-nourished.  HENT: Head: Normocephalic.  Right Ear: External ear normal.  Left Ear: External ear normal.  Eyes: Conjunctivae and EOM are normal. Pupils are equal, round, and reactive to light.  Neck: Normal range of motion. Neck supple.  Cardiovascular: Normal rate and regular rhythm.   Pulmonary/Chest: Effort normal and breath sounds normal.  Abd:  Soft, NT, non-distended, + BS Neurological: Pt is alert. No cranial nerve deficit.  Skin: Skin is warm. No erythema.  Diffuse nontender nonerythem swelling to the facies, neck and upper supraclavic areas Psychiatric: Pt behavior is normal. Thought content normal.     Assessment & Plan:

## 2011-03-22 NOTE — Patient Instructions (Addendum)
Take all new medications as prescribed Please call or return Monday Oct 15 if the swelling not improved, as you will need CXR and blood work You had the flu shot today If the prednisone and lab evaluation is not helpful, the next step would be MRI and to see neurosurgury You will be contacted regarding the referral for: GYN

## 2011-03-23 ENCOUNTER — Encounter: Payer: Self-pay | Admitting: Internal Medicine

## 2011-03-23 NOTE — Assessment & Plan Note (Addendum)
For predpack asd but I am not confident the edema is related to allergy type reaction;  I strongly encouraged pt to return or call by at least Mon oct 15 for further evaluation , and even consider MRI cspine/ortho or NS eval

## 2011-03-23 NOTE — Assessment & Plan Note (Signed)
Has known underlying c-spine disc dz, likely the cuase of recent increased pain, but exam o/w stable except for the edema, will try predpack trial, pt adaamant she does not want other eval today though I would like to pursue CXR to eval for chest mass/? SVC syndrome (pt nonsmoker), as well as routine labs

## 2011-03-23 NOTE — Assessment & Plan Note (Signed)
Declines cbc, to refer GYN

## 2011-03-24 ENCOUNTER — Encounter: Payer: Self-pay | Admitting: Obstetrics & Gynecology

## 2011-04-04 ENCOUNTER — Telehealth: Payer: Self-pay

## 2011-04-04 MED ORDER — MEDROXYPROGESTERONE ACETATE 10 MG PO TABS: 10.0000 mg | ORAL_TABLET | Freq: Every day | ORAL | Status: DC

## 2011-04-04 NOTE — Telephone Encounter (Signed)
I am not sure what happened with form, I believe I sent everything I could to [facility], though it may have been the wk she was not here  -   ? In loose papers with med records?  Ok for progestin for 10 days, but this is temporary at best, still needs to see GYN

## 2011-04-04 NOTE — Telephone Encounter (Signed)
Patient called left message that at last OV left minor disability form to be completed and has not yet received. Also requested something sent in to help her menstural cycle from coming so often.Marland Kitchen Please advise.

## 2011-04-04 NOTE — Telephone Encounter (Signed)
Called the patient left message to call back 

## 2011-04-05 NOTE — Telephone Encounter (Signed)
Patient informed of prescription sent in. Also informed of information regarding her forms, patient will bring new set of forms to the office to be completed.

## 2011-05-01 ENCOUNTER — Encounter: Payer: Self-pay | Admitting: Obstetrics and Gynecology

## 2011-09-29 ENCOUNTER — Ambulatory Visit: Payer: Self-pay | Admitting: Internal Medicine

## 2011-09-29 DIAGNOSIS — Z0289 Encounter for other administrative examinations: Secondary | ICD-10-CM

## 2011-10-06 ENCOUNTER — Ambulatory Visit (INDEPENDENT_AMBULATORY_CARE_PROVIDER_SITE_OTHER): Payer: Self-pay | Admitting: Internal Medicine

## 2011-10-06 ENCOUNTER — Encounter: Payer: Self-pay | Admitting: Internal Medicine

## 2011-10-06 VITALS — BP 142/100 | HR 103 | Temp 97.8°F | Ht 67.0 in | Wt 174.4 lb

## 2011-10-06 DIAGNOSIS — M5030 Other cervical disc degeneration, unspecified cervical region: Secondary | ICD-10-CM

## 2011-10-06 DIAGNOSIS — I10 Essential (primary) hypertension: Secondary | ICD-10-CM

## 2011-10-06 DIAGNOSIS — M5416 Radiculopathy, lumbar region: Secondary | ICD-10-CM

## 2011-10-06 DIAGNOSIS — D179 Benign lipomatous neoplasm, unspecified: Secondary | ICD-10-CM

## 2011-10-06 DIAGNOSIS — M5000 Cervical disc disorder with myelopathy, unspecified cervical region: Secondary | ICD-10-CM

## 2011-10-06 DIAGNOSIS — IMO0002 Reserved for concepts with insufficient information to code with codable children: Secondary | ICD-10-CM

## 2011-10-06 MED ORDER — LISINOPRIL 20 MG PO TABS: 20.0000 mg | ORAL_TABLET | Freq: Every day | ORAL | Status: DC

## 2011-10-06 MED ORDER — AMLODIPINE BESYLATE 5 MG PO TABS: 5.0000 mg | ORAL_TABLET | Freq: Every day | ORAL | Status: DC

## 2011-10-06 MED ORDER — TRAMADOL HCL 50 MG PO TABS: 50.0000 mg | ORAL_TABLET | Freq: Four times a day (QID) | ORAL | Status: DC | PRN

## 2011-10-06 NOTE — Patient Instructions (Addendum)
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

## 2011-10-08 ENCOUNTER — Encounter: Payer: Self-pay | Admitting: Internal Medicine

## 2011-10-08 DIAGNOSIS — M5416 Radiculopathy, lumbar region: Secondary | ICD-10-CM | POA: Insufficient documentation

## 2011-10-08 DIAGNOSIS — D170 Benign lipomatous neoplasm of skin and subcutaneous tissue of head, face and neck: Secondary | ICD-10-CM | POA: Insufficient documentation

## 2011-10-08 NOTE — Assessment & Plan Note (Signed)
Uncontrolled, to add lisinopril 20 qd, .follwoj  BP Readings from Last 3 Encounters:  10/06/11 142/100  03/22/11 152/90  11/30/09 192/104

## 2011-10-08 NOTE — Assessment & Plan Note (Signed)
With worsening s/s, d/w pt- could represent worsening myelopathy; needs MRI c-spine asap, and due to insurance situation will refer to [facility] NS

## 2011-10-08 NOTE — Assessment & Plan Note (Signed)
Incidental, pt educated and reassured, could have surgury for this sometime in the future but not urgent,  to f/u any worsening symptoms or concerns

## 2011-10-08 NOTE — Assessment & Plan Note (Signed)
A new problem, also now an issue but less so than cervical, ideally for MRI LS spine but declines at this time, for pain control,  to f/u any worsening symptoms or concerns

## 2011-10-08 NOTE — Progress Notes (Signed)
Subjective:    Patient ID: Lori Oconnell, female    DOB: [birth year], 51 y.o.   MRN: [id number]  HPI  Here to f/u after asked to return with respect to a form for disabilty; was last seen oct 2012 essentially stable neurologically, and has known large left paracentral disc herniation at c4-5 with a disc fragment and cord flattening centrally and to the left, as well as smaller disc herniations at c5-6, and c6-7 per MRI Jan 2011.  Pt did not f/u with surgical eval or tx due to lack of insurance.  Since last visit she unfortunately has developed Bilat UE distal weakness, tends to drop even small things, as well as bilat feet burning, balance and walking more difficult (though no falls or weakness of LE) and incidental new right LBP mild to mod recurrent with radiation of pain usually to the knee but sometimes to the foot, without bowel or bladder change, fever, wt loss, or falls.  She also mentions BP has been elev at drug store recently similar to today, and also a mass to the lower neck/upper mid back is slightly increased in size, though does not hurt.  Overall good compliance with treatment, and good medicine tolerability.  Today also with severe financial contraints, does not have health insurance and is extremely reluctant for any specific change in eval or tx today. Past Medical History  Diagnosis Date  . DISC DISEASE, CERVICAL 07/13/2009  . HEMATOCHEZIA 11/30/2009  . HYPERLIPIDEMIA 07/18/2009  . HYPERTENSION 07/12/2009  . NECK PAIN 07/12/2009  . OVERACTIVE BLADDER 07/12/2009  . RENAL INSUFFICIENCY 07/12/2009  . SINUSITIS- ACUTE-NOS 07/12/2009  . TRANSAMINASES, SERUM, ELEVATED 07/12/2009  . Edema 03/22/2011  . Irregular menses 03/22/2011   Past Surgical History  Procedure Date  . S/p ganglion cyst   . Tubal ligation     reports that she has quit smoking. She does not have any smokeless tobacco history on file. She reports that she drinks alcohol. She reports that she does not use illicit  drugs. family history includes Diabetes in her maternal aunt and Hypertension in her mother. No Known Allergies Current Outpatient Prescriptions on File Prior to Visit  Medication Sig Dispense Refill  . amLODipine (NORVASC) 5 MG tablet Take 1 tablet (5 mg total) by mouth daily.  90 tablet  3  . aspirin 81 MG tablet Take 81 mg by mouth daily.        . medroxyPROGESTERone (PROVERA) 10 MG tablet Take 1 tablet (10 mg total) by mouth daily.  10 tablet  0  . lisinopril (PRINIVIL,ZESTRIL) 20 MG tablet Take 1 tablet (20 mg total) by mouth daily.  90 tablet  3   Review of Systems Review of Systems  Constitutional: Negative for diaphoresis and unexpected weight change.  HENT: Negative for drooling and tinnitus.   Eyes: Negative for photophobia and visual disturbance.  Respiratory: Negative for choking and stridor.   Gastrointestinal: Negative for vomiting and blood in stool.  Genitourinary: Negative for hematuria and decreased urine volume.  Skin: Negative for color change and wound.  Neurological: Negative for tremors and numbness.    Objective:   Physical Exam BP 142/100  Pulse 103  Temp(Src) 97.8 F (36.6 C) (Oral)  Ht 5\' 7"  (1.702 m)  Wt 174 lb 6 oz (79.096 kg)  BMI 27.31 kg/m2  SpO2 96% Physical Exam  VS noted, not acutely ill appearing Constitutional: Pt appears well-developed and well-nourished.  HENT: Head: Normocephalic.  Right Ear: External ear normal.  Left Ear: External  ear normal.  Eyes: Conjunctivae and EOM are normal. Pupils are equal, round, and reactive to light.  Neck: Normal range of motion. Neck supple.  Cardiovascular: Normal rate and regular rhythm.   Pulmonary/Chest: Effort normal and breath sounds normal.  Abd:  Soft, NT, non-distended, + BS Neurological: Pt is alert. No cranial nerve deficit. motor 4+/5 bilat UE's o/w motor intact, gait somewhat unsteady Skin: Skin is warm. No erythema. Large approx 4 cm lipoma like mass to upper thoracic/low cervical  noted Psychiatric: Pt behavior is normal. Thought content normal. 1+ nervous, not depressed affect    Assessment & Plan:

## 2011-10-09 ENCOUNTER — Inpatient Hospital Stay: Admission: RE | Admit: 2011-10-09 | Payer: Self-pay | Source: Ambulatory Visit

## 2011-10-11 ENCOUNTER — Ambulatory Visit
Admission: RE | Admit: 2011-10-11 | Discharge: 2011-10-11 | Disposition: A | Discharge status: Home / Self Care | Payer: No Typology Code available for payment source | Source: Ambulatory Visit | Attending: Internal Medicine | Admitting: Internal Medicine

## 2011-10-11 DIAGNOSIS — M5000 Cervical disc disorder with myelopathy, unspecified cervical region: Secondary | ICD-10-CM

## 2011-10-12 ENCOUNTER — Encounter: Payer: Self-pay | Admitting: Internal Medicine

## 2011-10-18 ENCOUNTER — Telehealth: Payer: Self-pay | Admitting: Internal Medicine

## 2011-10-18 DIAGNOSIS — M542 Cervicalgia: Secondary | ICD-10-CM

## 2011-10-18 DIAGNOSIS — M5090 Cervical disc disorder, unspecified, unspecified cervical region: Secondary | ICD-10-CM

## 2011-10-18 NOTE — Telephone Encounter (Signed)
Dr [name] received fax back from [facility] that they are not accepting new patients at this time. Please advise

## 2011-10-18 NOTE — Telephone Encounter (Signed)
Mary, Do you know of any other option for pt with no insurance who has had an MRI to see NS?  If not, robin to ask pt if she would see orthopedic locally, but will need approx $200 "up front" which I think is very important and she should do this, if not able to see NS

## 2011-10-19 NOTE — Telephone Encounter (Signed)
No Dr [name] I do not know of any other options for this patient.

## 2011-10-19 NOTE — Telephone Encounter (Signed)
Called the patient and she does want a referral to ortho.

## 2011-10-19 NOTE — Telephone Encounter (Signed)
The best I can do is refer to orthopedic - will do  Robin to notify pt

## 2011-10-25 ENCOUNTER — Telehealth: Payer: Self-pay

## 2011-10-25 NOTE — Telephone Encounter (Signed)
To forward to [facility] for help with this

## 2011-10-25 NOTE — Telephone Encounter (Signed)
The neurologist referral that was made for this patient they would not see the patient.  The patient would like a referral to another neurologist as still having the same symptoms

## 2012-06-06 ENCOUNTER — Ambulatory Visit (INDEPENDENT_AMBULATORY_CARE_PROVIDER_SITE_OTHER): Payer: Self-pay | Admitting: Sports Medicine

## 2012-06-06 VITALS — BP 160/80 | Ht 66.0 in | Wt 160.0 lb

## 2012-06-06 DIAGNOSIS — M4800 Spinal stenosis, site unspecified: Secondary | ICD-10-CM

## 2012-06-06 NOTE — Progress Notes (Signed)
  Subjective:    Patient ID: Lori Oconnell, female    DOB: 02/15/1961, 51 y.o.   MRN: [id number]  HPI chief complaint: Neck pain  51 year old female comes in today complaining of 2 years of neck pain. Symptoms began after a work-related accident in which she was picking up an obese patient. She was working at the time as a Lawyer. Workup including an MRI scan at that time showed a large left-sided disc protrusion at C4-C5. She ultimately lost her job and subsequently her insurance. She has been dealing with the pain best that she can. She underwent a repeat MRI scan in May of 2013. The distal protrusion at C4-C5 had progressed resulting in spinal stenosis. There is mass effect on the spinal cord but no definitive cord signal abnormality. She has not really had any treatment to date in regards to this neck injury. No prior neck surgery. No physical therapy. Her finances are limited and she is without insurance. Majority of her pain is in her neck where she has also noticed increased swelling. She denies radiating pain into her arms. Occasional numbness into the left arm but nothing long-standing. No weakness.  Medications include lisinopril and tramadol for pain control Medical history includes hypertension and hyperlipidemia No known drug allergies Socially she does not smoke, drinks 2 alcoholic drinks per day, and is unemployed    Review of Systems     Objective:   Physical Exam Well-developed, well-nourished. No acute distress. Awake alert and oriented x3  Gen. appearance shows her to have a buffalo hump over the upper thoracic spine. There is also some hypertrophy of the right [place] joint. Neck: Cervical range of motion shows full flexion and extension. Cervical rotation is limited by 20 both the left and right. Negative Spurling's bilaterally. Neurological exam: Upper extremities appear to be equal in appearance. Strength is 5/5 both upper extremities. Reflexes are 2/4 at the biceps, triceps, and  brachial radialis tendons. Sensation is intact to light touch grossly. Negative [place] bilaterally.  MRI scans are as above       Assessment & Plan:  1. Neck pain with MRI evidence of spinal stenosis 2. 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 Her main complaint is neck pain and not radiculopathy. She is also concerned about the swelling in her neck which is either a lipoma or possibly a buffalo hump from hypercortisolism. I have provided her with the name of Rudell Cobb in hopes that we are able to get her some limited insurance in the form of the orange card. I've asked the patient to schedule a followup appointment once this is done. I would start with checking a cortisol level and getting x-rays of her right [place] joint. She may also benefit from physical therapy. I briefly discussed the possibility of cervical ESIs, but her lack of radiculopathy makes me suspect that she will not benefit much from this.

## 2012-06-24 ENCOUNTER — Ambulatory Visit (INDEPENDENT_AMBULATORY_CARE_PROVIDER_SITE_OTHER): Payer: No Typology Code available for payment source | Admitting: Sports Medicine

## 2012-06-24 ENCOUNTER — Other Ambulatory Visit: Payer: Self-pay | Admitting: Sports Medicine

## 2012-06-24 ENCOUNTER — Ambulatory Visit (HOSPITAL_COMMUNITY)
Admission: RE | Admit: 2012-06-24 | Discharge: 2012-06-24 | Disposition: A | Discharge status: Home / Self Care | Payer: No Typology Code available for payment source | Source: Ambulatory Visit | Attending: Sports Medicine | Admitting: Sports Medicine

## 2012-06-24 VITALS — BP 131/88 | Ht 66.0 in | Wt 150.0 lb

## 2012-06-24 DIAGNOSIS — M5030 Other cervical disc degeneration, unspecified cervical region: Secondary | ICD-10-CM

## 2012-06-24 DIAGNOSIS — M5416 Radiculopathy, lumbar region: Secondary | ICD-10-CM

## 2012-06-24 DIAGNOSIS — R609 Edema, unspecified: Secondary | ICD-10-CM

## 2012-06-24 DIAGNOSIS — M542 Cervicalgia: Secondary | ICD-10-CM | POA: Insufficient documentation

## 2012-06-24 DIAGNOSIS — IMO0002 Reserved for concepts with insufficient information to code with codable children: Secondary | ICD-10-CM | POA: Insufficient documentation

## 2012-06-25 ENCOUNTER — Telehealth: Payer: Self-pay | Admitting: Sports Medicine

## 2012-06-25 ENCOUNTER — Ambulatory Visit
Admission: RE | Admit: 2012-06-25 | Discharge: 2012-06-25 | Disposition: A | Discharge status: Home / Self Care | Payer: No Typology Code available for payment source | Source: Ambulatory Visit | Attending: Sports Medicine | Admitting: Sports Medicine

## 2012-06-25 DIAGNOSIS — M542 Cervicalgia: Secondary | ICD-10-CM

## 2012-06-25 MED ORDER — IOHEXOL 300 MG/ML  SOLN
1.0000 mL | Freq: Once | INTRAMUSCULAR | Status: AC | PRN
  Administered 2012-06-25: 1 mL via EPIDURAL

## 2012-06-25 MED ORDER — TRIAMCINOLONE ACETONIDE 40 MG/ML IJ SUSP (RADIOLOGY)
60.0000 mg | Freq: Once | INTRAMUSCULAR | Status: AC
  Administered 2012-06-25: 60 mg via EPIDURAL

## 2012-06-25 NOTE — Progress Notes (Signed)
  Subjective:    Patient ID: Lori Oconnell, female    DOB: 1961-04-25, 52 y.o.   MRN: [id number]  HPI Patient returns for followup. Since her last office visit she has acquired the [employer]. Still struggling with neck pain. No radiculopathy.    Review of Systems     Objective:   Physical Exam Limited cervical motion in all planes secondary to pain. Swelling in the posterior neck consistent with either a lipoma or a possible buffalo hump. No focal neurological deficits of either upper extremity.  Right sternoclavicular joint shows moderate swelling and mild tenderness to palpation when compared to the left sternoclavicular joint. Tenderness along the clavicle. Full shoulder range of motion.       Assessment & Plan:  1. Neck pain secondary to spinal stenosis 2. 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 Right sternoclavicular joint swelling likely due to DJD  Now that the patient has Rudell Cobb financial assistance I will go ahead and proceed with an x-ray of her right [place] joint. I will also check a serum cortisol level to rule out hypercortisolism. I will call her with those results once available. Although she is not experiencing any radiculopathy I do think it is reasonable to try a single cervical ESI to see if it will alleviate her pain. I explained to her that if she continues to struggle despite conservative treatment the neurosurgical referral may be necessary. She will followup after her ESI for check on her progress.

## 2012-06-25 NOTE — Telephone Encounter (Signed)
I spoke with Lori Oconnell today on the phone. Fortunately, she was able to get her cervical ESI earlier this morning. She is feeling much better with the numbing medicine in place. I reassured her that the x-ray of her right sternoclavicular joint showed no obvious abnormality. I think the swelling in his joint is degenerative. At this point in time I do not think it necessary to pursue further diagnostic imaging of this. Her cortisol level is also normal. Therefore, she likely has a large lipoma. She would like to have this surgically excised. She just recently got [employer] financial assistance and is awaiting an appointment with her new PCP. I'll defer referral to surgery to her PCP. In regards to her spinal stenosis, she will followup with me in 2-3 weeks. Hopefully she will get long-lasting results from the injection.

## 2012-06-26 ENCOUNTER — Other Ambulatory Visit: Payer: No Typology Code available for payment source

## 2012-07-15 ENCOUNTER — Other Ambulatory Visit: Payer: Self-pay | Admitting: Sports Medicine

## 2012-07-15 ENCOUNTER — Encounter: Payer: Self-pay | Admitting: Sports Medicine

## 2012-07-15 ENCOUNTER — Ambulatory Visit (INDEPENDENT_AMBULATORY_CARE_PROVIDER_SITE_OTHER): Payer: No Typology Code available for payment source | Admitting: Sports Medicine

## 2012-07-15 VITALS — BP 163/93 | HR 125 | Ht 66.0 in

## 2012-07-15 DIAGNOSIS — I871 Compression of vein: Secondary | ICD-10-CM

## 2012-07-15 DIAGNOSIS — N289 Disorder of kidney and ureter, unspecified: Secondary | ICD-10-CM

## 2012-07-15 LAB — BASIC METABOLIC PANEL
CO2: 26 mEq/L (ref 19–32)
Chloride: 107 mEq/L (ref 96–112)
Sodium: 139 mEq/L (ref 135–145)

## 2012-07-15 NOTE — Patient Instructions (Addendum)
You have been scheduled for CT scan of the neck and chest tomorrow (07/16/12) at [facility] please arrive at 12:45pm.   1st floor radiology  [phone]  Please go to the [facility] this afternoon to have some lab work needed before you CT scans  Please do not have any solid food 4 hours before test- liquids are fine   Follow up with Dr. Margaretha Sheffield Wednesday  Thank you for seeing [place] today!

## 2012-07-16 ENCOUNTER — Other Ambulatory Visit: Payer: Self-pay | Admitting: *Deleted

## 2012-07-16 ENCOUNTER — Ambulatory Visit (HOSPITAL_COMMUNITY)
Admission: RE | Admit: 2012-07-16 | Discharge: 2012-07-16 | Disposition: A | Discharge status: Home / Self Care | Payer: No Typology Code available for payment source | Source: Ambulatory Visit | Attending: Sports Medicine | Admitting: Sports Medicine

## 2012-07-16 ENCOUNTER — Encounter (HOSPITAL_COMMUNITY): Payer: Self-pay

## 2012-07-16 DIAGNOSIS — I871 Compression of vein: Secondary | ICD-10-CM

## 2012-07-16 DIAGNOSIS — R079 Chest pain, unspecified: Secondary | ICD-10-CM | POA: Insufficient documentation

## 2012-07-16 DIAGNOSIS — R220 Localized swelling, mass and lump, head: Secondary | ICD-10-CM | POA: Insufficient documentation

## 2012-07-16 DIAGNOSIS — R609 Edema, unspecified: Secondary | ICD-10-CM

## 2012-07-16 DIAGNOSIS — M542 Cervicalgia: Secondary | ICD-10-CM

## 2012-07-16 DIAGNOSIS — E049 Nontoxic goiter, unspecified: Secondary | ICD-10-CM | POA: Insufficient documentation

## 2012-07-16 MED ORDER — IOHEXOL 300 MG/ML  SOLN
125.0000 mL | Freq: Once | INTRAMUSCULAR | Status: AC | PRN
  Administered 2012-07-16: 100 mL via INTRAVENOUS

## 2012-07-16 NOTE — Progress Notes (Signed)
  Subjective:    Patient ID: Lori Oconnell, female    DOB: [birth year], 52 y.o.   MRN: [id number]  HPI Patient comes in today for followup. Neck pain is much improved after a single cervical ESI but she is concerned about worsening swelling around her neck. It is gotten worse over the past 2 weeks. He feels like the swelling is now beginning to go up into her neck as well. She denies shortness of breath. She has not noticed swelling in her arms. He does note that the left leg wants to give way on her on occasion.    Review of Systems     Objective:   Physical Exam Well-developed, no acute distress  Patient has significant soft tissue swelling around her neck and her shoulders. The previous buffalo hump has increased in size and there is significant swelling along the distal neck and proximal shoulder region. Swelling extends down into the axilla as well. It is mildly tender to palpation. Non-erythematous. No crepitus. Lungs are clear to auscultation bilaterally. There is no swelling noted in either upper or lower extremities      Assessment & Plan:  1. 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 Improved neck pain secondary to cervical degenerative disc disease  CT scan of the neck and chest with and without contrast. The studies will be done tomorrow and the patient will followup with me the following day. My main concern is for a possible superior vena cava syndrome. A previous serum cortisol level was within normal limits. We will delineate further workup and treatment on the CT findings.

## 2012-07-17 ENCOUNTER — Telehealth: Payer: Self-pay | Admitting: *Deleted

## 2012-07-17 ENCOUNTER — Ambulatory Visit: Payer: No Typology Code available for payment source | Admitting: Sports Medicine

## 2012-07-17 ENCOUNTER — Telehealth: Payer: Self-pay | Admitting: Sports Medicine

## 2012-07-17 ENCOUNTER — Ambulatory Visit (HOSPITAL_COMMUNITY): Admission: RE | Admit: 2012-07-17 | Payer: No Typology Code available for payment source | Source: Ambulatory Visit

## 2012-07-17 ENCOUNTER — Other Ambulatory Visit: Payer: Self-pay | Admitting: *Deleted

## 2012-07-17 DIAGNOSIS — R221 Localized swelling, mass and lump, neck: Secondary | ICD-10-CM

## 2012-07-17 DIAGNOSIS — M542 Cervicalgia: Secondary | ICD-10-CM

## 2012-07-17 LAB — TSH: TSH: 0.848 u[IU]/mL (ref 0.350–4.500)

## 2012-07-17 MED ORDER — DIAZEPAM 5 MG PO TABS: ORAL_TABLET | ORAL | Status: DC

## 2012-07-17 NOTE — Progress Notes (Signed)
Sent in to be taken as needed for MRI per Dr. Margaretha Sheffield.

## 2012-07-17 NOTE — Telephone Encounter (Signed)
Per Dr. Margaretha Sheffield- advised pt that CT scan was not abnormal.  Scheduled her for MRI of cervical spine for this afternoon at 1:45pm and TSH and Free T4.  She is coming tomorrow morning to f/u with Dr. Margaretha Sheffield.

## 2012-07-17 NOTE — Telephone Encounter (Signed)
I spoke with Lori Oconnell today on the phone regarding CT findings of both her neck and her chest. CT does not explain the soft tissue swelling in her neck or her chest. Therefore, I want to order an MRI scan with and without contrast of her neck. I will also order a TSH and free T4. Phone followup after reviewing that study to delineate further treatment.

## 2012-07-18 ENCOUNTER — Encounter (HOSPITAL_COMMUNITY): Payer: Self-pay

## 2012-07-18 ENCOUNTER — Telehealth: Payer: Self-pay | Admitting: *Deleted

## 2012-07-18 ENCOUNTER — Ambulatory Visit (HOSPITAL_COMMUNITY)
Admission: RE | Admit: 2012-07-18 | Discharge: 2012-07-18 | Disposition: A | Discharge status: Home / Self Care | Payer: No Typology Code available for payment source | Source: Ambulatory Visit | Attending: Sports Medicine | Admitting: Sports Medicine

## 2012-07-18 ENCOUNTER — Ambulatory Visit: Payer: No Typology Code available for payment source | Admitting: Sports Medicine

## 2012-07-18 DIAGNOSIS — M5020 Other cervical disc displacement, unspecified cervical region: Secondary | ICD-10-CM | POA: Insufficient documentation

## 2012-07-18 DIAGNOSIS — M47812 Spondylosis without myelopathy or radiculopathy, cervical region: Secondary | ICD-10-CM | POA: Insufficient documentation

## 2012-07-18 DIAGNOSIS — R220 Localized swelling, mass and lump, head: Secondary | ICD-10-CM | POA: Insufficient documentation

## 2012-07-18 DIAGNOSIS — R221 Localized swelling, mass and lump, neck: Secondary | ICD-10-CM

## 2012-07-18 DIAGNOSIS — M542 Cervicalgia: Secondary | ICD-10-CM

## 2012-07-18 DIAGNOSIS — M5030 Other cervical disc degeneration, unspecified cervical region: Secondary | ICD-10-CM | POA: Insufficient documentation

## 2012-07-18 MED ORDER — GADOBENATE DIMEGLUMINE 529 MG/ML IV SOLN
14.0000 mL | Freq: Once | INTRAVENOUS | Status: AC
  Administered 2012-07-18: 14 mL via INTRAVENOUS

## 2012-07-19 ENCOUNTER — Encounter: Payer: Self-pay | Admitting: *Deleted

## 2012-07-19 ENCOUNTER — Telehealth: Payer: Self-pay | Admitting: Sports Medicine

## 2012-07-19 NOTE — Telephone Encounter (Signed)
I spoke with Lori Oconnell today on the phone after reviewing the MRI scan of her cervical spine. I personally reviewed this with one of the musculoskeletal radiologists. We compared the findings to previous MRI scans. There is a significant amount of fat deposition in the posterior neck. There may be a small underlying lipoma but the majority of the fat is not well encapsulated. No evidence for liposarcoma. No obvious lymphadenopathy. 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 This was also confirmed on CT scan. At this point I am at a loss to explain this patient's symptoms. I do think her swelling represents some sort of abnormal fat deposition possibly from Cushing's syndrome. I would like for the patient to be evaluated at the family practice center. Although a recent serum cortisol level was normal I think she needs a more thorough medical evaluation. Even though she has evidence for cervical degenerative disc disease, her symptoms do not appear to be musculoskeletal in origin. We will try to get this patient seen sometime next week.

## 2012-07-19 NOTE — Telephone Encounter (Signed)
Per Dr. Margaretha Sheffield, pt will be scheduled at [facility] for further evaluation of neck mass.

## 2012-07-19 NOTE — Progress Notes (Signed)
Per request of Dr. Jennette Kettle have reassigned patient to [facility].  PCP changed to [employer].

## 2012-08-09 ENCOUNTER — Encounter: Payer: Self-pay | Admitting: Family Medicine

## 2012-08-09 ENCOUNTER — Ambulatory Visit (INDEPENDENT_AMBULATORY_CARE_PROVIDER_SITE_OTHER): Payer: No Typology Code available for payment source | Admitting: Family Medicine

## 2012-08-09 VITALS — BP 117/82 | HR 84 | Ht 64.5 in | Wt 182.0 lb

## 2012-08-09 DIAGNOSIS — D170 Benign lipomatous neoplasm of skin and subcutaneous tissue of head, face and neck: Secondary | ICD-10-CM

## 2012-08-09 DIAGNOSIS — D1739 Benign lipomatous neoplasm of skin and subcutaneous tissue of other sites: Secondary | ICD-10-CM

## 2012-08-09 NOTE — Progress Notes (Signed)
Patient ID: Lamont Glasscock, female   DOB: [birth year], 52 y.o.   MRN: [id number]  [facility] Amber M. Hairford, MD Phone: [phone]   Subjective: HPI: Patient is a 52 y.o. female presenting to clinic today as a referral from [facility] for neck mass. Neck pain started in 2010 with some swelling. The size of the area has been getting larger since then, and really more noticeable in the last 2-2.5 years. 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 She has had some ESI which helped with the neck pain, but not with the size of the mass or the pain related to it. MRI done a few weeks ago which showed arthritis as well as subq tissue with possible lipoma. Pain with lying down, can't get comfortable. Pain radiates down neck. Swelling in chest, under arms and now in face as well. She has had cortisol level, TSH and CBC/BMP which were normal. No other family members with this.   History Reviewed: F[place] smoker. Health Maintenance: Needs pap  ROS: Please see HPI above.  Objective: Office vital signs reviewed. BP 117/82  Pulse 84  Ht 5' 4.5" (1.638 m)  Wt 182 lb (82.555 kg)  BMI 30.77 kg/m2  LMP 03/19/2011  Physical Examination:  General: Awake, alert. NAD. Husband at bedside HEENT: Atraumatic, normocephalic. Slight swelling of her cheeks Neck: Large 10x10 cm fatty mass on cervical spine with swelling noted on shoulders and down to front of chest and under arms. Pulm: CTAB, no wheezes Cardio: RRR, no murmurs appreciated Abdomen:+BS, soft, nontender, nondistended Extremities: No edema Neuro: Grossly intact  Assessment: 52 y.o. female with lipoma  Plan: See Problem List and After Visit Summary

## 2012-08-09 NOTE — Assessment & Plan Note (Signed)
Patient evaluated with Dr. Leveda Anna. This is an extensively large fatty deposit. Given the size and the fact that it is growing so large, would be concerned about leiomyosarcoma or a type of cancer even though lipomas are typically benign. Patient is ready to have something done about this, and I agree. Will refer to General Surgery for evaluation including a biopsy and plan for resection of the tumor, if possible. Thew swelling of her chest may be an extension of the lipoma, or could be edema from the pressure the tumor is putting on the area. Pt agrees with surgical evaluation. She should continue all current medications. I will see her back after her surgical consult in the next 1-2 months.

## 2012-08-09 NOTE — Patient Instructions (Signed)
It was nice to meet you today!  We have placed a referral to general surgery for biopsy (if they feel it is appropriate) and resection of the tumor.   I will see you back after your surgery visit, or if you need anything else before then.  Take care! Nima Kemppainen M. Arline Ketter, M.D.

## 2012-08-13 ENCOUNTER — Encounter (INDEPENDENT_AMBULATORY_CARE_PROVIDER_SITE_OTHER): Payer: Self-pay | Admitting: Surgery

## 2012-08-13 ENCOUNTER — Ambulatory Visit (INDEPENDENT_AMBULATORY_CARE_PROVIDER_SITE_OTHER): Payer: PRIVATE HEALTH INSURANCE | Admitting: Surgery

## 2012-08-13 VITALS — BP 160/92 | HR 84 | Temp 97.2°F | Resp 16 | Ht 64.0 in | Wt 184.6 lb

## 2012-08-13 DIAGNOSIS — R222 Localized swelling, mass and lump, trunk: Secondary | ICD-10-CM | POA: Insufficient documentation

## 2012-08-13 DIAGNOSIS — R229 Localized swelling, mass and lump, unspecified: Secondary | ICD-10-CM

## 2012-08-13 MED ORDER — HYDROCODONE-ACETAMINOPHEN 5-325 MG PO TABS: 1.0000 | ORAL_TABLET | Freq: Four times a day (QID) | ORAL | Status: DC | PRN

## 2012-08-13 NOTE — Progress Notes (Signed)
Patient ID: Lori Oconnell, female   DOB: [birth year], 52 y.o.   MRN: [id number]  No chief complaint on file.   HPI Lori Oconnell is a 52 y.o. female.  Patient sent at the request of Dr. Tana Coast for a large mass in the upper midline of her back just below her neck. Isn't present for over 2-1/2 years. He has been increasing in size and has increased considerably over the last 4 months. She also has some swelling noted involving both shoulder regions. She has no arm swelling.  She's having more pain due to compression and having a hard time extending her neck due to mass effect. HPI  Past Medical History  Diagnosis Date  . DISC DISEASE, CERVICAL 07/13/2009  . HEMATOCHEZIA 11/30/2009  . HYPERLIPIDEMIA 07/18/2009  . HYPERTENSION 07/12/2009  . NECK PAIN 07/12/2009  . OVERACTIVE BLADDER 07/12/2009  . RENAL INSUFFICIENCY 07/12/2009  . SINUSITIS- ACUTE-NOS 07/12/2009  . TRANSAMINASES, SERUM, ELEVATED 07/12/2009  . Edema 03/22/2011  . Irregular menses 03/22/2011    Past Surgical History  Procedure Laterality Date  . S/p ganglion cyst    . Tubal ligation      Family History  Problem Relation Age of Onset  . Hypertension Mother   . Diabetes Maternal Aunt     Social History History  Substance Use Topics  . Smoking status: Former Games developer  . Smokeless tobacco: Not on file     Comment: none since 2002  . Alcohol Use: Yes     Comment: social    No Known Allergies  Current Outpatient Prescriptions  Medication Sig Dispense Refill  . amLODipine (NORVASC) 5 MG tablet Take 1 tablet (5 mg total) by mouth daily.  90 tablet  3  . aspirin 81 MG tablet Take 81 mg by mouth daily.        . diazepam (VALIUM) 5 MG tablet Take 1 tablet by mouth 1 hour before procedure, may repeat x1 if needed.  2 tablet  0  . lisinopril (PRINIVIL,ZESTRIL) 20 MG tablet Take 1 tablet (20 mg total) by mouth daily.  90 tablet  3  . traMADol (ULTRAM) 50 MG tablet Take 1 tablet (50 mg total) by mouth 4 (four) times daily as needed.  60  tablet  2  . HYDROcodone-acetaminophen (NORCO) 5-325 MG per tablet Take 1 tablet by mouth every 6 (six) hours as needed for pain.  30 tablet  0  . medroxyPROGESTERone (PROVERA) 10 MG tablet Take 1 tablet (10 mg total) by mouth daily.  10 tablet  0   No current facility-administered medications for this visit.    Review of Systems Review of Systems  Constitutional: Negative.   HENT: Positive for neck pain.   Eyes: Negative.   Respiratory: Negative.   Cardiovascular: Negative.   Gastrointestinal: Negative.   Endocrine: Negative.   Genitourinary: Negative.   Allergic/Immunologic: Negative.   Neurological: Negative.   Hematological: Negative.   Psychiatric/Behavioral: Negative.     Blood pressure 160/92, pulse 84, temperature 97.2 F (36.2 C), resp. rate 16, height 5\' 4"  (1.626 m), weight 184 lb 9.6 oz (83.734 kg), last menstrual period 03/19/2011.  Physical Exam Physical Exam  Constitutional: She is oriented to person, place, and time. She appears well-developed and well-nourished.  HENT:  Head: Normocephalic and atraumatic.  Eyes: EOM are normal. Pupils are equal, round, and reactive to light.  Neck: Neck supple. No tracheal deviation present.  Musculoskeletal: Normal range of motion.  Lymphadenopathy:    She has no cervical adenopathy.  Neurological: She is alert and oriented to person, place, and time.  Skin:     Psychiatric: She has a normal mood and affect. Her behavior is normal. Thought content normal.    Data Reviewed Clinical Data: Swelling in the neck, shoulders, face, chest, and  under the arms. Neck mass.  MRI CERVICAL SPINE WITHOUT AND WITH CONTRAST  Technique: Multiplanar and multiecho pulse sequences of the  cervical spine, to include the craniocervical junction and  cervicothoracic junction, were obtained according to standard  protocol without and with intravenous contrast.  Contrast: 14mL MULTIHANCE GADOBENATE DIMEGLUMINE 529 MG/ML IV SOLN    Comparison: 07/16/2012; 10/11/2011  Findings: Please note that today's exam was requested or performed  as a cervical spine assessment, and accordingly we used the  cervical spine protocol rather than the neck protocol.  Posteriorly along the neck, there is some prominence of the  subcutaneous adipose tissues, which measure about 5 cm in  thickness. The possibility of an underlying lipoma in this  vicinity cannot be excluded - no nodularity or significant abnormal  enhancement in the subcutaneous tissues is identified, nor is there  mass in the posterior paraspinal musculature  The craniocervical junction appears unremarkable.  No significant vertebral subluxation.  Inversion recovery weighted images demonstrate no significant  abnormal vertebral or periligamentous edema.  No significant abnormal cord signal is identified.  Additional findings at individual levels are as follows:  C2-3: Unremarkable.  C3-4: Reduced conspicuity of the small central disc protrusion.  Borderline central stenosis.  C4-5: Moderate to prominent central stenosis due to left  paracentral disc protrusion, AP diameter of the thecal sac 6 mm.  Mild right and borderline left foraminal stenosis due to uncinate  spurring.  C5-6: Moderate central stenosis due to central disc protrusion,  slightly worsened in the interim. Borderline left foraminal  stenosis due to uncinate spurring.  C6-7: Moderate left eccentric central stenosis due to left  eccentric disc protrusion.  C7-T1: Unremarkable.  IMPRESSION:  1. Cervical spondylosis and degenerative disc disease causing  moderate to prominent impingement at C4-5 and moderate impingement  at C5-6 and C6-7 as detailed above.  2. Although no discrete posterior neck mass is identified, there  is prominence of the subcutaneous tissues starting just below the  C2-3 level, and the possibility of underlying subcutaneous lipoma  obscured by surrounding subcutaneous adipose  tissue is not  excluded.  Original Report Authenticated By: Gaylyn Rong, M.D.   Assessment    15 cm mass upper back  Favor  Lipoma causing pain in lower neck and upper back    Plan    Recommend excision for pain relief.  The procedure has been discussed with the patient.  Alternative therapies have been discussed with the patient.  Operative risks include bleeding,  Infection,  Organ injury,  Nerve injury,  Blood vessel injury,  DVT,  Pulmonary embolism,  Death,  And possible reoperation.  Medical management risks include worsening of present situation.  The success of the procedure is 50 -90 % at treating patients symptoms.  The patient understands and agrees to proceed.       Courtny Bennison A. 08/13/2012, 2:54 PM

## 2012-08-13 NOTE — Patient Instructions (Signed)
Lipoma  A lipoma is a noncancerous (benign) tumor composed of fat cells. They are usually found under the skin (subcutaneous). A lipoma may occur in any tissue of the body that contains fat. Common areas for lipomas to appear include the back, shoulders, buttocks, and thighs. Lipomas are a very common soft tissue growth. They are soft and grow slowly. Most problems caused by a lipoma depend on where it is growing.  DIAGNOSIS   A lipoma can be diagnosed with a physical exam. These tumors rarely become cancerous, but radiographic studies can help determine this for certain. Studies used may include:   Computerized X-ray scans (CT or CAT scan).   Computerized magnetic scans (MRI).  TREATMENT   Small lipomas that are not causing problems may be watched. If a lipoma continues to enlarge or causes problems, removal is often the best treatment. Lipomas can also be removed to improve appearance. Surgery is done to remove the fatty cells and the surrounding capsule. Most often, this is done with medicine that numbs the area (local anesthetic). The removed tissue is examined under a microscope to make sure it is not cancerous. Keep all follow-up appointments with your caregiver.  SEEK MEDICAL CARE IF:    The lipoma becomes larger or hard.   The lipoma becomes painful, red, or increasingly swollen. These could be signs of infection or a more serious condition.  Document Released: 05/19/2002 Document Revised: 08/21/2011 Document Reviewed: 10/29/2009  [employer] Patient Information 2013 [employer].

## 2012-08-16 ENCOUNTER — Encounter (HOSPITAL_COMMUNITY): Payer: Self-pay | Admitting: Pharmacy Technician

## 2012-08-21 ENCOUNTER — Encounter (HOSPITAL_COMMUNITY)
Admission: RE | Admit: 2012-08-21 | Discharge: 2012-08-21 | Disposition: A | Discharge status: Home / Self Care | Payer: No Typology Code available for payment source | Source: Ambulatory Visit | Attending: Anesthesiology | Admitting: Anesthesiology

## 2012-08-21 ENCOUNTER — Encounter (HOSPITAL_COMMUNITY): Payer: Self-pay

## 2012-08-21 ENCOUNTER — Encounter (HOSPITAL_COMMUNITY)
Admission: RE | Admit: 2012-08-21 | Discharge: 2012-08-21 | Disposition: A | Discharge status: Home / Self Care | Payer: No Typology Code available for payment source | Source: Ambulatory Visit | Attending: Surgery | Admitting: Surgery

## 2012-08-21 HISTORY — DX: Sleep apnea, unspecified: G47.30

## 2012-08-21 LAB — CBC WITH DIFFERENTIAL/PLATELET
Basophils Relative: 1 % (ref 0–1)
Eosinophils Absolute: 0.2 10*3/uL (ref 0.0–0.7)
Eosinophils Relative: 2 % (ref 0–5)
MCH: 27.8 pg (ref 26.0–34.0)
MCHC: 32 g/dL (ref 30.0–36.0)
MCV: 86.9 fL (ref 78.0–100.0)
Neutrophils Relative %: 72 % (ref 43–77)
Platelets: 243 10*3/uL (ref 150–400)
RDW: 19 % — ABNORMAL HIGH (ref 11.5–15.5)

## 2012-08-21 LAB — COMPREHENSIVE METABOLIC PANEL
ALT: 54 U/L — ABNORMAL HIGH (ref 0–35)
Albumin: 3.7 g/dL (ref 3.5–5.2)
Alkaline Phosphatase: 107 U/L (ref 39–117)
BUN: 7 mg/dL (ref 6–23)
Calcium: 9.8 mg/dL (ref 8.4–10.5)
GFR calc Af Amer: >90 mL/min (ref >90)
Glucose, Bld: 101 mg/dL — ABNORMAL HIGH (ref 70–99)
Potassium: 3.5 mEq/L (ref 3.5–5.1)
Sodium: 141 mEq/L (ref 135–145)
Total Protein: 8.4 g/dL — ABNORMAL HIGH (ref 6.0–8.3)

## 2012-08-21 LAB — SURGICAL PCR SCREEN: MRSA, PCR: NEGATIVE

## 2012-08-21 NOTE — Progress Notes (Addendum)
Patient explained that while pregnant, she has external hemorrhoids, that bled.  No problem now.. She is not aware of any kidney problems  .DA  11:41  Called [facility] and spoke with [place] regarding results of HFP.  She will let Dr. Luisa Hart know.  DA Hepatic Function Panel     Component Value Date/Time   PROT 8.4* 08/21/2012 0958   ALBUMIN 3.7 08/21/2012 0958   AST 134* 08/21/2012 0958   ALT 54* 08/21/2012 0958   ALKPHOS 107 08/21/2012 0958   BILITOT 0.6 08/21/2012 0958   BILIDIR 0.1 07/12/2009 0000

## 2012-08-21 NOTE — Pre-Procedure Instructions (Signed)
Sammye Staff  08/21/2012   Your procedure is scheduled on: Tuesday, March 18th   Report to [facility] at 5:30 AM.  Call this number if you have problems the morning of surgery: [phone]   Remember:   Do not eat food or drink liquids after midnight Monday.   Take these medicines the morning of surgery with A SIP OF WATER: Norvasc, Norco (if needed)   Do not wear jewelry, make-up or nail polish.  Do not wear lotions, powders, or perfumes. You may NOT wear deodorant.   Do not shave underarms & legs 48 hours prior to surgery.   Do not bring valuables to the hospital.  Contacts, dentures or bridgework may not be worn into surgery.   Leave suitcase in the car. After surgery it may be brought to your room.  For patients admitted to the hospital, checkout time is 11:00 AM the day of discharge.   Patients discharged the day of surgery will not be allowed to drive home.             You will need a responsible adult stay with you for the first 24 yrs after surgery.   Name and phone number of your driver:    Special Instructions: Shower using CHG 2 nights before surgery and the night before surgery.  If you shower the day of surgery use CHG.  Use special wash - you have one bottle of CHG for all showers.  You should use approximately 1/3 of the bottle for each shower.   Please read over the following fact sheets that you were given: Pain Booklet, MRSA Information and Surgical Site Infection Prevention

## 2012-08-26 MED ORDER — DEXTROSE 5 % IV SOLN
3.0000 g | INTRAVENOUS | Status: AC
  Administered 2012-08-27: 3 g via INTRAVENOUS
  Filled 2012-08-26: qty 3000

## 2012-08-26 MED ORDER — CHLORHEXIDINE GLUCONATE 4 % EX LIQD: 1.0000 "application " | Freq: Once | CUTANEOUS | Status: DC

## 2012-08-27 ENCOUNTER — Encounter (HOSPITAL_COMMUNITY): Payer: Self-pay | Admitting: Certified Registered"

## 2012-08-27 ENCOUNTER — Ambulatory Visit (HOSPITAL_COMMUNITY)
Admission: RE | Admit: 2012-08-27 | Discharge: 2012-08-27 | Disposition: A | Discharge status: Home / Self Care | Payer: No Typology Code available for payment source | Source: Ambulatory Visit | Attending: Surgery | Admitting: Surgery

## 2012-08-27 ENCOUNTER — Encounter (HOSPITAL_COMMUNITY): Payer: Self-pay | Admitting: Anesthesiology

## 2012-08-27 ENCOUNTER — Ambulatory Visit (HOSPITAL_COMMUNITY): Payer: No Typology Code available for payment source | Admitting: Certified Registered"

## 2012-08-27 ENCOUNTER — Encounter (HOSPITAL_COMMUNITY)
Admission: RE | Disposition: A | Discharge status: Home / Self Care | Payer: Self-pay | Source: Ambulatory Visit | Attending: Surgery

## 2012-08-27 DIAGNOSIS — D1739 Benign lipomatous neoplasm of skin and subcutaneous tissue of other sites: Secondary | ICD-10-CM

## 2012-08-27 DIAGNOSIS — N259 Disorder resulting from impaired renal tubular function, unspecified: Secondary | ICD-10-CM

## 2012-08-27 DIAGNOSIS — M5030 Other cervical disc degeneration, unspecified cervical region: Secondary | ICD-10-CM | POA: Insufficient documentation

## 2012-08-27 DIAGNOSIS — I10 Essential (primary) hypertension: Secondary | ICD-10-CM | POA: Insufficient documentation

## 2012-08-27 DIAGNOSIS — Z87891 Personal history of nicotine dependence: Secondary | ICD-10-CM | POA: Insufficient documentation

## 2012-08-27 DIAGNOSIS — R229 Localized swelling, mass and lump, unspecified: Secondary | ICD-10-CM | POA: Insufficient documentation

## 2012-08-27 DIAGNOSIS — Z79899 Other long term (current) drug therapy: Secondary | ICD-10-CM | POA: Insufficient documentation

## 2012-08-27 DIAGNOSIS — R609 Edema, unspecified: Secondary | ICD-10-CM

## 2012-08-27 DIAGNOSIS — M542 Cervicalgia: Secondary | ICD-10-CM

## 2012-08-27 DIAGNOSIS — Z0000 Encounter for general adult medical examination without abnormal findings: Secondary | ICD-10-CM

## 2012-08-27 DIAGNOSIS — N289 Disorder of kidney and ureter, unspecified: Secondary | ICD-10-CM | POA: Insufficient documentation

## 2012-08-27 DIAGNOSIS — E785 Hyperlipidemia, unspecified: Secondary | ICD-10-CM | POA: Insufficient documentation

## 2012-08-27 DIAGNOSIS — G4730 Sleep apnea, unspecified: Secondary | ICD-10-CM | POA: Insufficient documentation

## 2012-08-27 DIAGNOSIS — M5416 Radiculopathy, lumbar region: Secondary | ICD-10-CM

## 2012-08-27 DIAGNOSIS — G709 Myoneural disorder, unspecified: Secondary | ICD-10-CM | POA: Insufficient documentation

## 2012-08-27 DIAGNOSIS — D170 Benign lipomatous neoplasm of skin and subcutaneous tissue of head, face and neck: Secondary | ICD-10-CM

## 2012-08-27 DIAGNOSIS — N318 Other neuromuscular dysfunction of bladder: Secondary | ICD-10-CM

## 2012-08-27 DIAGNOSIS — Z7982 Long term (current) use of aspirin: Secondary | ICD-10-CM | POA: Insufficient documentation

## 2012-08-27 DIAGNOSIS — K921 Melena: Secondary | ICD-10-CM

## 2012-08-27 HISTORY — PX: MASS EXCISION: SHX2000

## 2012-08-27 SURGERY — EXCISION MASS
Anesthesia: General | Site: Back | Wound class: Clean

## 2012-08-27 MED ORDER — LACTATED RINGERS IV SOLN
INTRAVENOUS | Status: DC | PRN
  Administered 2012-08-27 (×2): via INTRAVENOUS

## 2012-08-27 MED ORDER — LIDOCAINE HCL (CARDIAC) 20 MG/ML IV SOLN
INTRAVENOUS | Status: DC | PRN
  Administered 2012-08-27: 60 mg via INTRAVENOUS

## 2012-08-27 MED ORDER — ROCURONIUM BROMIDE 100 MG/10ML IV SOLN
INTRAVENOUS | Status: DC | PRN
  Administered 2012-08-27: 30 mg via INTRAVENOUS

## 2012-08-27 MED ORDER — BUPIVACAINE HCL (PF) 0.25 % IJ SOLN
INTRAMUSCULAR | Status: DC | PRN
  Administered 2012-08-27: 30 mL

## 2012-08-27 MED ORDER — BUPIVACAINE HCL (PF) 0.25 % IJ SOLN
INTRAMUSCULAR | Status: AC
  Filled 2012-08-27: qty 30

## 2012-08-27 MED ORDER — PROPRANOLOL HCL 1 MG/ML IV SOLN
INTRAVENOUS | Status: DC | PRN
  Administered 2012-08-27: .5 mg via INTRAVENOUS

## 2012-08-27 MED ORDER — PROPOFOL 10 MG/ML IV BOLUS
INTRAVENOUS | Status: DC | PRN
  Administered 2012-08-27: 200 mg via INTRAVENOUS

## 2012-08-27 MED ORDER — LIDOCAINE HCL 4 % MT SOLN
OROMUCOSAL | Status: DC | PRN
  Administered 2012-08-27: 4 mL via TOPICAL

## 2012-08-27 MED ORDER — NEOSTIGMINE METHYLSULFATE 1 MG/ML IJ SOLN
INTRAMUSCULAR | Status: DC | PRN
  Administered 2012-08-27: 3 mg via INTRAVENOUS

## 2012-08-27 MED ORDER — OXYCODONE-ACETAMINOPHEN 5-325 MG PO TABS: 1.0000 | ORAL_TABLET | ORAL | Status: DC | PRN

## 2012-08-27 MED ORDER — 0.9 % SODIUM CHLORIDE (POUR BTL) OPTIME
TOPICAL | Status: DC | PRN
  Administered 2012-08-27: 1000 mL

## 2012-08-27 MED ORDER — ONDANSETRON HCL 4 MG/2ML IJ SOLN: 4.0000 mg | Freq: Once | INTRAMUSCULAR | Status: DC | PRN

## 2012-08-27 MED ORDER — ONDANSETRON HCL 4 MG/2ML IJ SOLN
INTRAMUSCULAR | Status: DC | PRN
  Administered 2012-08-27: 4 mg via INTRAVENOUS

## 2012-08-27 MED ORDER — HYDROMORPHONE HCL PF 1 MG/ML IJ SOLN
0.2500 mg | INTRAMUSCULAR | Status: DC | PRN
Start: 2012-08-27 — End: 2012-08-27
  Administered 2012-08-27: 0.5 mg via INTRAVENOUS

## 2012-08-27 MED ORDER — MIDAZOLAM HCL 5 MG/5ML IJ SOLN
INTRAMUSCULAR | Status: DC | PRN
  Administered 2012-08-27: 1 mg via INTRAVENOUS

## 2012-08-27 MED ORDER — HYDROMORPHONE HCL PF 1 MG/ML IJ SOLN
INTRAMUSCULAR | Status: AC
  Filled 2012-08-27: qty 1

## 2012-08-27 MED ORDER — FENTANYL CITRATE 0.05 MG/ML IJ SOLN
INTRAMUSCULAR | Status: DC | PRN
  Administered 2012-08-27: 50 ug via INTRAVENOUS
  Administered 2012-08-27: 100 ug via INTRAVENOUS
  Administered 2012-08-27 (×2): 50 ug via INTRAVENOUS

## 2012-08-27 MED ORDER — GLYCOPYRROLATE 0.2 MG/ML IJ SOLN
INTRAMUSCULAR | Status: DC | PRN
  Administered 2012-08-27: 0.4 mg via INTRAVENOUS

## 2012-08-27 SURGICAL SUPPLY — 45 items
BIOPATCH BLUE 3/4IN DISK W/1.5 (GAUZE/BANDAGES/DRESSINGS) ×2 IMPLANT
BIOPATCH RED 1 DISK 7.0 (GAUZE/BANDAGES/DRESSINGS) ×2 IMPLANT
BLADE SURG 10 STRL SS (BLADE) IMPLANT
BLADE SURG 15 STRL LF DISP TIS (BLADE) IMPLANT
BLADE SURG 15 STRL SS (BLADE)
BLADE SURG ROTATE 9660 (MISCELLANEOUS) IMPLANT
CHLORAPREP W/TINT 26ML (MISCELLANEOUS) ×2 IMPLANT
CLOTH BEACON ORANGE TIMEOUT ST (SAFETY) ×2 IMPLANT
COVER SURGICAL LIGHT HANDLE (MISCELLANEOUS) ×2 IMPLANT
DECANTER SPIKE VIAL GLASS SM (MISCELLANEOUS) ×2 IMPLANT
DERMABOND ADVANCED (GAUZE/BANDAGES/DRESSINGS) ×1
DERMABOND ADVANCED .7 DNX12 (GAUZE/BANDAGES/DRESSINGS) ×1 IMPLANT
DRAIN CHANNEL 19F RND (DRAIN) ×2 IMPLANT
DRAPE LAPAROSCOPIC ABDOMINAL (DRAPES) IMPLANT
DRAPE PED LAPAROTOMY (DRAPES) ×2 IMPLANT
DRAPE UTILITY 15X26 W/TAPE STR (DRAPE) ×4 IMPLANT
ELECT CAUTERY BLADE 6.4 (BLADE) ×2 IMPLANT
ELECT REM PT RETURN 9FT ADLT (ELECTROSURGICAL) ×2
ELECTRODE REM PT RTRN 9FT ADLT (ELECTROSURGICAL) ×1 IMPLANT
EVACUATOR SILICONE 100CC (DRAIN) ×2 IMPLANT
GLOVE BIO SURGEON STRL SZ8 (GLOVE) ×2 IMPLANT
GLOVE BIOGEL PI IND STRL 8 (GLOVE) ×1 IMPLANT
GLOVE BIOGEL PI INDICATOR 8 (GLOVE) ×1
GOWN STRL NON-REIN LRG LVL3 (GOWN DISPOSABLE) ×4 IMPLANT
GOWN STRL REIN XL XLG (GOWN DISPOSABLE) ×2 IMPLANT
KIT BASIN OR (CUSTOM PROCEDURE TRAY) ×2 IMPLANT
KIT ROOM TURNOVER OR (KITS) ×2 IMPLANT
NEEDLE HYPO 25GX1X1/2 BEV (NEEDLE) ×2 IMPLANT
NS IRRIG 1000ML POUR BTL (IV SOLUTION) ×2 IMPLANT
PACK GENERAL/GYN (CUSTOM PROCEDURE TRAY) ×2 IMPLANT
PACK SURGICAL SETUP 50X90 (CUSTOM PROCEDURE TRAY) IMPLANT
PAD ARMBOARD 7.5X6 YLW CONV (MISCELLANEOUS) ×2 IMPLANT
PENCIL BUTTON HOLSTER BLD 10FT (ELECTRODE) ×2 IMPLANT
SPECIMEN JAR MEDIUM (MISCELLANEOUS) IMPLANT
SPONGE LAP 18X18 X RAY DECT (DISPOSABLE) ×2 IMPLANT
SUT ETHILON 2 0 FS 18 (SUTURE) ×2 IMPLANT
SUT MNCRL AB 3-0 PS2 18 (SUTURE) ×2 IMPLANT
SUT MNCRL AB 4-0 PS2 18 (SUTURE) ×2 IMPLANT
SUT VIC AB 2-0 SH 27 (SUTURE) ×3
SUT VIC AB 2-0 SH 27X BRD (SUTURE) ×3 IMPLANT
SUT VIC AB 3-0 SH 27 (SUTURE) ×1
SUT VIC AB 3-0 SH 27XBRD (SUTURE) ×1 IMPLANT
SYR CONTROL 10ML LL (SYRINGE) ×2 IMPLANT
TOWEL OR 17X24 6PK STRL BLUE (TOWEL DISPOSABLE) ×2 IMPLANT
TOWEL OR 17X26 10 PK STRL BLUE (TOWEL DISPOSABLE) ×2 IMPLANT

## 2012-08-27 NOTE — Interval H&P Note (Signed)
History and Physical Interval Note:  08/27/2012 6:59 AM  Lori Oconnell  has presented today for surgery, with the diagnosis of mass upper back  The various methods of treatment have been discussed with the patient and family. After consideration of risks, benefits and other options for treatment, the patient has consented to  Procedure(s): EXCISION MASS UPPER BACK (N/A) as a surgical intervention .  The patient's history has been reviewed, patient examined, no change in status, stable for surgery.  I have reviewed the patient's chart and labs.  Questions were answered to the patient's satisfaction.     [name] A.

## 2012-08-27 NOTE — Op Note (Signed)
Lori Oconnell                 ACCOUNT NO.:  [id number]  MEDICAL RECORD NO.:  [id number]  LOCATION:  [facility]                         FACILITY:  [facility]  PHYSICIAN:  Lori Oconnell, M.D.DATE OF BIRTH:  [birth year]  DATE OF PROCEDURE:  08/27/2012 DATE OF DISCHARGE:  08/27/2012                              OPERATIVE REPORT   PREOPERATIVE DIAGNOSIS:  A 12- x 16-cm subcutaneous tissue, upper back.  POSTOPERATIVE DIAGNOSIS:  A 12- x 16-cm subcutaneous tissue, upper back with probable lipoma.  PROCEDURE: 1. Excision of 12 cm x 16 cm mass in subcutaneous tissues upper back. 2. A 12-cm intermediate closure of complex upper back wound with     placement of 19-French drain.  SURGEON:  Lori Schwalbe, MD.  ANESTHESIA:  General endotracheal anesthesia.  ESTIMATED BLOOD LOSS:  Minimal.  SPECIMEN:  Upper subcutaneous mass resembling lipoma to pathology.  DRAINS:  A 19-round Blake drain to wound.  IV FLUIDS:  A liter of crystalloid .  INDICATIONS FOR PROCEDURE:  The patient is a 52 year old female with an enlarging mass on her upper back at the base of her neck.  It has increased in size over the last year or so.  It started out the cause pain, especially with her trying to extend her neck since it gets in the way.  Lying on it is quite painful as well.  She was examined in the office and felt to be a lipoma.  She had a preop MRI which showed minimally significant disk disease in her neck but no explanation for pain.  The areas tender was beginning to resume her range of motion. She also developed some fatty accumulation over both shoulders which I thought was not related to this.  I recommend excision of this what appeared to be a large lipoma.  Risks, benefits, and alternative therapies were discussed.  Risk of bleeding, infection, nerve injury, blood vessel injury, injury to underlying structures, the need for further operations discussed depending on pathology.  She  voiced understanding of all of the above, wished to proceed.  DESCRIPTION OF PROCEDURE:  The patient was met in the holding area. Questions were answered.  She was then taken back to the operating room and initially placed supine were general endotracheal anesthesia was initiated by anesthesia.  She was then placed prone and appropriately padded.  The mass was in the upper back at the base of her neck, it was prepped and draped in sterile fashion and a time-out was done.  She received 3 g of Ancef.  Local anesthesia consisting of 0.25% Sensorcaine with epinephrine injected around the area.  Transverse incision was made.  Dissection carried down the subcutaneous fat.  A very large lobulated mass resembling a lipoma was then excised from the subcutaneous tissue all the way down to the muscle fascia.  This was preserved.  The entire mass measured 12 x 16 cm.  The cavity was found to be hemostatic and then irrigated.  Through a separate stab incision, a 19-round Blake drain was placed and secured with 2-0 nylon.  We then mobilized the skin to create skin flaps.  This was done for a total length of  12 cm along the length of the wound.  Once this was done, we used a deep layer of 3-0 Vicryl to reapproximate the deep tissues down to the muscle and I tried to close down the cavity as best as we can. We used a second layer of 3-0 Vicryl to reapproximate the subcutaneous fat.  We trimmed off some excess skin and then closed the skin with 3-0 Monocryl in a subcuticular fashion.  Dermabond was applied.  Drain was placed to bulb suction.  All final counts of sponge, needle, and instruments found to be correct.  At this point in time.  She was then placed supine extubated taken recovery in satisfactory condition.     Lori Oconnell, M.D.     TAC/MEDQ  D:  08/27/2012  T:  08/27/2012  Job:  [id number]

## 2012-08-27 NOTE — Transfer of Care (Signed)
Immediate Anesthesia Transfer of Care Note  Patient: Lori Oconnell  Procedure(s) Performed: Procedure(s): EXCISION MASS UPPER BACK (N/A)  Patient Location: PACU  Anesthesia Type:General  Level of Consciousness: awake, alert  and oriented  Airway & Oxygen Therapy: Patient connected to face mask  Post-op Assessment: Report given to PACU RN  Post vital signs: stable  Complications: No apparent anesthesia complications

## 2012-08-27 NOTE — H&P (View-Only) (Signed)
Patient ID: Lori Oconnell, female   DOB: 13-May-1961, 52 y.o.   MRN: [id number]  No chief complaint on file.   HPI Lori Oconnell is a 52 y.o. female.  Patient sent at the request of Dr. Tana Coast for a large mass in the upper midline of her back just below her neck. Isn't present for over 2-1/2 years. He has been increasing in size and has increased considerably over the last 4 months. She also has some swelling noted involving both shoulder regions. She has no arm swelling.  She's having more pain due to compression and having a hard time extending her neck due to mass effect. HPI  Past Medical History  Diagnosis Date  . DISC DISEASE, CERVICAL 07/13/2009  . HEMATOCHEZIA 11/30/2009  . HYPERLIPIDEMIA 07/18/2009  . HYPERTENSION 07/12/2009  . NECK PAIN 07/12/2009  . OVERACTIVE BLADDER 07/12/2009  . RENAL INSUFFICIENCY 07/12/2009  . SINUSITIS- ACUTE-NOS 07/12/2009  . TRANSAMINASES, SERUM, ELEVATED 07/12/2009  . Edema 03/22/2011  . Irregular menses 03/22/2011    Past Surgical History  Procedure Laterality Date  . S/p ganglion cyst    . Tubal ligation      Family History  Problem Relation Age of Onset  . Hypertension Mother   . Diabetes Maternal Aunt     Social History History  Substance Use Topics  . Smoking status: Former Games developer  . Smokeless tobacco: Not on file     Comment: none since 2002  . Alcohol Use: Yes     Comment: social    No Known Allergies  Current Outpatient Prescriptions  Medication Sig Dispense Refill  . amLODipine (NORVASC) 5 MG tablet Take 1 tablet (5 mg total) by mouth daily.  90 tablet  3  . aspirin 81 MG tablet Take 81 mg by mouth daily.        . diazepam (VALIUM) 5 MG tablet Take 1 tablet by mouth 1 hour before procedure, may repeat x1 if needed.  2 tablet  0  . lisinopril (PRINIVIL,ZESTRIL) 20 MG tablet Take 1 tablet (20 mg total) by mouth daily.  90 tablet  3  . traMADol (ULTRAM) 50 MG tablet Take 1 tablet (50 mg total) by mouth 4 (four) times daily as needed.  60  tablet  2  . HYDROcodone-acetaminophen (NORCO) 5-325 MG per tablet Take 1 tablet by mouth every 6 (six) hours as needed for pain.  30 tablet  0  . medroxyPROGESTERone (PROVERA) 10 MG tablet Take 1 tablet (10 mg total) by mouth daily.  10 tablet  0   No current facility-administered medications for this visit.    Review of Systems Review of Systems  Constitutional: Negative.   HENT: Positive for neck pain.   Eyes: Negative.   Respiratory: Negative.   Cardiovascular: Negative.   Gastrointestinal: Negative.   Endocrine: Negative.   Genitourinary: Negative.   Allergic/Immunologic: Negative.   Neurological: Negative.   Hematological: Negative.   Psychiatric/Behavioral: Negative.     Blood pressure 160/92, pulse 84, temperature 97.2 F (36.2 C), resp. rate 16, height 5\' 4"  (1.626 m), weight 184 lb 9.6 oz (83.734 kg), last menstrual period 03/19/2011.  Physical Exam Physical Exam  Constitutional: She is oriented to person, place, and time. She appears well-developed and well-nourished.  HENT:  Head: Normocephalic and atraumatic.  Eyes: EOM are normal. Pupils are equal, round, and reactive to light.  Neck: Neck supple. No tracheal deviation present.  Musculoskeletal: Normal range of motion.  Lymphadenopathy:    She has no cervical adenopathy.  Neurological: She is alert and oriented to person, place, and time.  Skin:     Psychiatric: She has a normal mood and affect. Her behavior is normal. Thought content normal.    Data Reviewed Clinical Data: Swelling in the neck, shoulders, face, chest, and  under the arms. Neck mass.  MRI CERVICAL SPINE WITHOUT AND WITH CONTRAST  Technique: Multiplanar and multiecho pulse sequences of the  cervical spine, to include the craniocervical junction and  cervicothoracic junction, were obtained according to standard  protocol without and with intravenous contrast.  Contrast: 14mL MULTIHANCE GADOBENATE DIMEGLUMINE 529 MG/ML IV SOLN    Comparison: 07/16/2012; 10/11/2011  Findings: Please note that today's exam was requested or performed  as a cervical spine assessment, and accordingly we used the  cervical spine protocol rather than the neck protocol.  Posteriorly along the neck, there is some prominence of the  subcutaneous adipose tissues, which measure about 5 cm in  thickness. The possibility of an underlying lipoma in this  vicinity cannot be excluded - no nodularity or significant abnormal  enhancement in the subcutaneous tissues is identified, nor is there  mass in the posterior paraspinal musculature  The craniocervical junction appears unremarkable.  No significant vertebral subluxation.  Inversion recovery weighted images demonstrate no significant  abnormal vertebral or periligamentous edema.  No significant abnormal cord signal is identified.  Additional findings at individual levels are as follows:  C2-3: Unremarkable.  C3-4: Reduced conspicuity of the small central disc protrusion.  Borderline central stenosis.  C4-5: Moderate to prominent central stenosis due to left  paracentral disc protrusion, AP diameter of the thecal sac 6 mm.  Mild right and borderline left foraminal stenosis due to uncinate  spurring.  C5-6: Moderate central stenosis due to central disc protrusion,  slightly worsened in the interim. Borderline left foraminal  stenosis due to uncinate spurring.  C6-7: Moderate left eccentric central stenosis due to left  eccentric disc protrusion.  C7-T1: Unremarkable.  IMPRESSION:  1. Cervical spondylosis and degenerative disc disease causing  moderate to prominent impingement at C4-5 and moderate impingement  at C5-6 and C6-7 as detailed above.  2. Although no discrete posterior neck mass is identified, there  is prominence of the subcutaneous tissues starting just below the  C2-3 level, and the possibility of underlying subcutaneous lipoma  obscured by surrounding subcutaneous adipose  tissue is not  excluded.  Original Report Authenticated By: Gaylyn Rong, M.D.   Assessment    15 cm mass upper back  Favor  Lipoma causing pain in lower neck and upper back    Plan    Recommend excision for pain relief.  The procedure has been discussed with the patient.  Alternative therapies have been discussed with the patient.  Operative risks include bleeding,  Infection,  Organ injury,  Nerve injury,  Blood vessel injury,  DVT,  Pulmonary embolism,  Death,  And possible reoperation.  Medical management risks include worsening of present situation.  The success of the procedure is 50 -90 % at treating patients symptoms.  The patient understands and agrees to proceed.       Saphia Vanderford A. 08/13/2012, 2:54 PM

## 2012-08-27 NOTE — Anesthesia Procedure Notes (Addendum)
Procedure Name: Intubation Date/Time: 08/20/2012 7:35 AM Performed by: Ellin Goodie Pre-anesthesia Checklist: Patient identified, Emergency Drugs available, Suction available, Patient being monitored and Timeout performed Patient Re-evaluated:Patient Re-evaluated prior to inductionOxygen Delivery Method: Circle system utilized Preoxygenation: Pre-oxygenation with 100% oxygen Intubation Type: IV induction Ventilation: Mask ventilation without difficulty Laryngoscope Size: Mac and 3 Grade View: Grade I Tube type: Oral Tube size: 7.5 mm Number of attempts: 1 Airway Equipment and Method: Stylet Placement Confirmation: ETT inserted through vocal cords under direct vision,  positive ETCO2 and breath sounds checked- equal and bilateral Secured at: 21 cm Dental Injury: Teeth and Oropharynx as per pre-operative assessment    Performed by: Ellin Goodie Airway Equipment and Method: LTA kit utilized

## 2012-08-27 NOTE — Brief Op Note (Signed)
08/27/2012  9:00 AM  PATIENT:  Lori Oconnell  52 y.o. female  PRE-OPERATIVE DIAGNOSIS:  mass upper back  POST-OPERATIVE DIAGNOSIS:  mass upper back  PROCEDURE:  Procedure(s): EXCISION MASS UPPER BACK (N/A)  SURGEON:  Surgeon(s) and Role:    * Sloan Galentine A. Jacquelynn Friend, MD - Primary    ASSISTANTS: none   ANESTHESIA:   local and general  EBL:  Total I/O In: 1000 [I.V.:1000] Out: -   BLOOD ADMINISTERED:none  DRAINS: (19 F) Jackson-Pratt drain(s) with closed bulb suction in the UPPER BACK WOUND   LOCAL MEDICATIONS USED:  BUPIVICAINE   SPECIMEN:  Source of Specimen:  UPPER BACK   DISPOSITION OF SPECIMEN:  PATHOLOGY  COUNTS:  YES  TOURNIQUET:  * No tourniquets in log *  DICTATION: .Other Dictation: Dictation Number [id number]  PLAN OF CARE: Discharge to home after PACU  PATIENT DISPOSITION:  PACU - hemodynamically stable.   Delay start of Pharmacological VTE agent (>24hrs) due to surgical blood loss or risk of bleeding: not applicable

## 2012-08-27 NOTE — Anesthesia Preprocedure Evaluation (Addendum)
Anesthesia Evaluation  Patient identified by MRN, date of birth, ID band Patient awake    Reviewed: Allergy & Precautions, H&P , NPO status , Patient's Chart, lab work & pertinent test results, reviewed documented beta blocker date and time   Airway Mallampati: I TM Distance: >3 FB Neck ROM: full    Dental  (+) Teeth Intact, Poor Dentition, Dental Advisory Given and Missing,    Pulmonary sleep apnea ,  breath sounds clear to auscultation        Cardiovascular hypertension, Pt. on medications Rhythm:regular Rate:Normal     Neuro/Psych  Neuromuscular disease    GI/Hepatic   Endo/Other    Renal/GU Renal InsufficiencyRenal disease     Musculoskeletal   Abdominal (+)  Abdomen: soft. Bowel sounds: normal.  Peds  Hematology   Anesthesia Other Findings   Reproductive/Obstetrics                         Anesthesia Physical Anesthesia Plan  ASA: II  Anesthesia Plan: General   Post-op Pain Management:    Induction:   Airway Management Planned: Oral ETT  Additional Equipment:   Intra-op Plan:   Post-operative Plan: Extubation in OR  Informed Consent: I have reviewed the patients History and Physical, chart, labs and discussed the procedure including the risks, benefits and alternatives for the proposed anesthesia with the patient or authorized representative who has indicated his/her understanding and acceptance.     Plan Discussed with: CRNA, Anesthesiologist and Surgeon  Anesthesia Plan Comments:         Anesthesia Quick Evaluation

## 2012-08-27 NOTE — Anesthesia Postprocedure Evaluation (Signed)
  Anesthesia Post-op Note  Patient: Lori Oconnell  Procedure(s) Performed: Procedure(s): EXCISION MASS UPPER BACK (N/A)  Patient Location: PACU  Anesthesia Type:General  Level of Consciousness: awake, alert , oriented and patient cooperative  Airway and Oxygen Therapy: Patient Spontanous Breathing  Post-op Pain: mild  Post-op Assessment: Post-op Vital signs reviewed, Patient's Cardiovascular Status Stable, Respiratory Function Stable, Patent Airway, No signs of Nausea or vomiting and Pain level controlled  Post-op Vital Signs: stable  Complications: No apparent anesthesia complications

## 2012-08-29 ENCOUNTER — Telehealth (INDEPENDENT_AMBULATORY_CARE_PROVIDER_SITE_OTHER): Payer: Self-pay

## 2012-08-29 ENCOUNTER — Telehealth (INDEPENDENT_AMBULATORY_CARE_PROVIDER_SITE_OTHER): Payer: Self-pay | Admitting: General Surgery

## 2012-08-29 ENCOUNTER — Encounter (HOSPITAL_COMMUNITY): Payer: Self-pay | Admitting: Surgery

## 2012-08-29 NOTE — Telephone Encounter (Signed)
Pt called to ask "what can [you] do to help me get my prescription?"  She has an orange card, and it won't pay for her Rx.  Explained that [employer] does not have a scholarship program to help her and suggested she petition her family to chip in what they can to help her out.  Pt then disconnected the call.

## 2012-08-29 NOTE — Telephone Encounter (Signed)
LMOM to call back. I made her po appt for 3/25 @ 2:50.

## 2012-09-03 ENCOUNTER — Encounter (INDEPENDENT_AMBULATORY_CARE_PROVIDER_SITE_OTHER): Payer: Self-pay | Admitting: Surgery

## 2012-09-03 ENCOUNTER — Ambulatory Visit (INDEPENDENT_AMBULATORY_CARE_PROVIDER_SITE_OTHER): Payer: PRIVATE HEALTH INSURANCE | Admitting: Surgery

## 2012-09-03 VITALS — BP 144/84 | HR 108 | Temp 97.2°F | Resp 16 | Ht 64.0 in | Wt 189.6 lb

## 2012-09-03 DIAGNOSIS — Z9889 Other specified postprocedural states: Secondary | ICD-10-CM

## 2012-09-03 NOTE — Patient Instructions (Signed)
KEEP DRAIN IN PLACE.  KEEP NEXT APPT.

## 2012-09-03 NOTE — Progress Notes (Signed)
Patient returns after excision of lipoma upper back. Final sizes 15 cm and is is benign. JP drain in place and functioning well. Still draining a fair amount of fluid daily.  Exam: Incision clean dry intact without seroma.  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  Creeping fat over upper extremities etiology unclear may need plastic surgery input

## 2012-09-13 ENCOUNTER — Encounter (INDEPENDENT_AMBULATORY_CARE_PROVIDER_SITE_OTHER): Payer: Self-pay | Admitting: Surgery

## 2012-09-13 ENCOUNTER — Ambulatory Visit (INDEPENDENT_AMBULATORY_CARE_PROVIDER_SITE_OTHER): Payer: PRIVATE HEALTH INSURANCE | Admitting: Surgery

## 2012-09-13 VITALS — BP 190/100 | HR 88 | Temp 97.6°F | Resp 16 | Ht 64.0 in | Wt 186.0 lb

## 2012-09-13 DIAGNOSIS — D179 Benign lipomatous neoplasm, unspecified: Secondary | ICD-10-CM

## 2012-09-13 NOTE — Patient Instructions (Signed)
Will refer to plastics for opinion on fat deposition on torso.

## 2012-09-13 NOTE — Progress Notes (Signed)
Patient returns after excision of lipoma upper back. Final sizes 15 cm and is is benign. JP drain in place and functioning well. Draining much less. Removed today in the office.  Exam: Incision clean dry intact without seroma.  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  Creeping fat over upper extremities etiology unclear Will refer to plastic surgery for opinion about creeping fat of her shoulders and upper extremity.

## 2012-10-15 ENCOUNTER — Telehealth (INDEPENDENT_AMBULATORY_CARE_PROVIDER_SITE_OTHER): Payer: Self-pay | Admitting: General Surgery

## 2012-10-15 MED ORDER — HYDROCODONE-ACETAMINOPHEN 5-325 MG PO TABS: 1.0000 | ORAL_TABLET | Freq: Four times a day (QID) | ORAL | Status: DC | PRN

## 2012-10-15 NOTE — Telephone Encounter (Signed)
Patient calling status post lipoma removal on 08/27/2012. She states she is still having pain and requesting a refill of oxycodone. Please advise. Dr Luisa Hart paged and he advised we could give Norco #20 but no oxycodone refill. Patient made aware and agreed. RX called to [facility].

## 2012-10-18 ENCOUNTER — Encounter (INDEPENDENT_AMBULATORY_CARE_PROVIDER_SITE_OTHER): Payer: PRIVATE HEALTH INSURANCE | Admitting: Surgery

## 2012-11-06 ENCOUNTER — Ambulatory Visit (INDEPENDENT_AMBULATORY_CARE_PROVIDER_SITE_OTHER): Payer: PRIVATE HEALTH INSURANCE | Admitting: Surgery

## 2012-11-06 ENCOUNTER — Encounter (INDEPENDENT_AMBULATORY_CARE_PROVIDER_SITE_OTHER): Payer: Self-pay | Admitting: Surgery

## 2012-11-06 VITALS — BP 140/94 | HR 105 | Temp 98.9°F | Resp 18 | Ht 64.0 in | Wt 187.8 lb

## 2012-11-06 DIAGNOSIS — N6482 Hypoplasia of breast: Secondary | ICD-10-CM
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
## 2012-11-06 NOTE — Patient Instructions (Signed)
Refer to plastics for fat over shoulders and asymetry of breasts.

## 2012-11-07 ENCOUNTER — Ambulatory Visit: Payer: No Typology Code available for payment source | Admitting: Internal Medicine

## 2012-11-08 ENCOUNTER — Ambulatory Visit (INDEPENDENT_AMBULATORY_CARE_PROVIDER_SITE_OTHER): Payer: No Typology Code available for payment source | Admitting: Internal Medicine

## 2012-11-08 ENCOUNTER — Telehealth (INDEPENDENT_AMBULATORY_CARE_PROVIDER_SITE_OTHER): Payer: Self-pay | Admitting: General Surgery

## 2012-11-08 ENCOUNTER — Other Ambulatory Visit (INDEPENDENT_AMBULATORY_CARE_PROVIDER_SITE_OTHER): Payer: No Typology Code available for payment source

## 2012-11-08 ENCOUNTER — Encounter: Payer: Self-pay | Admitting: Internal Medicine

## 2012-11-08 VITALS — BP 110/70 | HR 96 | Temp 97.8°F | Ht 64.0 in | Wt 190.0 lb

## 2012-11-08 DIAGNOSIS — R350 Frequency of micturition: Secondary | ICD-10-CM

## 2012-11-08 DIAGNOSIS — F418 Other specified anxiety disorders: Secondary | ICD-10-CM

## 2012-11-08 DIAGNOSIS — I10 Essential (primary) hypertension: Secondary | ICD-10-CM

## 2012-11-08 DIAGNOSIS — F341 Dysthymic disorder: Secondary | ICD-10-CM

## 2012-11-08 LAB — URINALYSIS, ROUTINE W REFLEX MICROSCOPIC
Bilirubin Urine: NEGATIVE
Hgb urine dipstick: NEGATIVE
Nitrite: NEGATIVE
Total Protein, Urine: NEGATIVE
Urobilinogen, UA: 0.2 (ref 0.0–1.0)

## 2012-11-08 MED ORDER — CEPHALEXIN 500 MG PO CAPS: 500.0000 mg | ORAL_CAPSULE | Freq: Four times a day (QID) | ORAL | Status: DC

## 2012-11-08 MED ORDER — CITALOPRAM HYDROBROMIDE 20 MG PO TABS: 20.0000 mg | ORAL_TABLET | Freq: Every day | ORAL | Status: DC

## 2012-11-08 NOTE — Patient Instructions (Signed)
Please take all new medication as prescribed - the antibiotic, and the celexa 20 mg per day for nerves (remember this takes about 3-4 wks for the full effect) You will be contacted regarding the referral for: counseling (psychology) Your urine will be sent for studies today Please continue all other medications as before, and refills have been done if requested. You will be contacted by phone if any changes need to be made immediately.  Otherwise, you will receive a letter about your results with an explanation, but please check with MyChart first.  Thank you for enrolling in MyChart. Please follow the instructions below to securely access your online medical record. MyChart allows you to send messages to your doctor, view your test results, renew your prescriptions, schedule appointments, and more.

## 2012-11-08 NOTE — Telephone Encounter (Signed)
Spoke with patient she is aware of appt on 11/12/12 Dr Odis Luster at 130pm
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
## 2012-11-09 DIAGNOSIS — F418 Other specified anxiety disorders: Secondary | ICD-10-CM | POA: Insufficient documentation

## 2012-11-09 DIAGNOSIS — R350 Frequency of micturition: Secondary | ICD-10-CM | POA: Insufficient documentation

## 2012-11-09 LAB — URINE CULTURE: Colony Count: NO GROWTH

## 2012-11-09 NOTE — Assessment & Plan Note (Signed)
stable overall by history and exam, recent data reviewed with pt, and pt to continue medical treatment as before,  to f/u any worsening symptoms or concerns BP Readings from Last 3 Encounters:  11/08/12 110/70  11/06/12 140/94  09/13/12 190/100

## 2012-11-09 NOTE — Assessment & Plan Note (Signed)
?   Cystitis, for empiric antibx, check urine studies,  to f/u any worsening symptoms or concerns

## 2012-11-09 NOTE — Assessment & Plan Note (Addendum)
Mild to mod, verified nonsuicidal and no HI, for celexa 20 qd, refer counseling, to f/u any worsening symptoms or concerns

## 2012-11-20 ENCOUNTER — Ambulatory Visit: Payer: No Typology Code available for payment source | Admitting: Licensed Clinical Social Worker

## 2012-11-20 ENCOUNTER — Telehealth (INDEPENDENT_AMBULATORY_CARE_PROVIDER_SITE_OTHER): Payer: Self-pay

## 2012-11-20 NOTE — Telephone Encounter (Signed)
Returning call from patient yesterday. No answer, LMOM. I did not work with Dr Luisa Hart the day she was in our office so i am not aware of what appointment she is referring to. Patient will need to be more specific on what the appointment was for so I can look into it for her.

## 2012-11-20 NOTE — Telephone Encounter (Signed)
Message copied by Brennan Bailey on Wed Nov 20, 2012 10:38 AM ------      Message from: Milas Hock      Created: Tue Nov 19, 2012  3:16 PM      Contact: [phone]                   ----- Message -----         From: Avie Echevaria         Sent: 11/19/2012   2:08 PM           To: Milas Hock, CMA            Please call her about an appt you and her talked about  ------

## 2012-11-21 NOTE — Telephone Encounter (Signed)
Pt calling back.  She was not able to keep her appt with Dr. Odis Luster.  He does not take her insurance.  She would like to discuss options w/ Dr. Luisa Hart or his nurse, Marcelino Duster.

## 2012-11-22 ENCOUNTER — Telehealth (INDEPENDENT_AMBULATORY_CARE_PROVIDER_SITE_OTHER): Payer: Self-pay | Admitting: General Surgery

## 2012-11-22 NOTE — Telephone Encounter (Signed)
Pt returned call and she would like to go ahead with referral to Dr. Kelly Splinter, if possible
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
## 2012-11-22 NOTE — Telephone Encounter (Signed)
Spoke with patient and made her aware of appt with Dr Kelly Splinter scheduled 12/06/2012 @ 4:00 pm. Records faxed to [phone]. Confirmation received.

## 2012-12-06 ENCOUNTER — Other Ambulatory Visit: Payer: Self-pay | Admitting: Internal Medicine

## 2012-12-06 ENCOUNTER — Telehealth: Payer: Self-pay | Admitting: Internal Medicine

## 2012-12-06 DIAGNOSIS — E881 Lipodystrophy, not elsewhere classified: Secondary | ICD-10-CM

## 2012-12-06 NOTE — Telephone Encounter (Signed)
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 Endocrine or other etiology  I will ask pt for HIV, TSH, lipids, BMET (glc) LFT's, cbc, RF, ANA, sed rate  Robin to inform pt, I will do lab orders

## 2012-12-09 ENCOUNTER — Ambulatory Visit: Payer: No Typology Code available for payment source | Admitting: Internal Medicine

## 2012-12-09 ENCOUNTER — Telehealth: Payer: Self-pay

## 2012-12-09 NOTE — Telephone Encounter (Signed)
A user error has taken place: encounter opened in error, closed for administrative reasons.

## 2012-12-09 NOTE — Telephone Encounter (Signed)
Patient informed. 

## 2012-12-10 ENCOUNTER — Other Ambulatory Visit (INDEPENDENT_AMBULATORY_CARE_PROVIDER_SITE_OTHER): Payer: Medicare Other

## 2012-12-10 ENCOUNTER — Encounter: Payer: Self-pay | Admitting: Internal Medicine

## 2012-12-10 ENCOUNTER — Ambulatory Visit (INDEPENDENT_AMBULATORY_CARE_PROVIDER_SITE_OTHER): Payer: Medicare Other | Admitting: Internal Medicine

## 2012-12-10 VITALS — BP 182/120 | HR 116 | Temp 98.0°F | Ht 64.0 in | Wt 184.1 lb

## 2012-12-10 DIAGNOSIS — Z0000 Encounter for general adult medical examination without abnormal findings: Secondary | ICD-10-CM

## 2012-12-10 DIAGNOSIS — D539 Nutritional anemia, unspecified: Secondary | ICD-10-CM | POA: Insufficient documentation

## 2012-12-10 DIAGNOSIS — D649 Anemia, unspecified: Secondary | ICD-10-CM | POA: Diagnosis not present

## 2012-12-10 DIAGNOSIS — E881 Lipodystrophy, not elsewhere classified: Secondary | ICD-10-CM

## 2012-12-10 DIAGNOSIS — I10 Essential (primary) hypertension: Secondary | ICD-10-CM

## 2012-12-10 LAB — CBC WITH DIFFERENTIAL/PLATELET
Basophils Relative: 0.1 % (ref 0.0–3.0)
Hemoglobin: 12 g/dL (ref 12.0–15.0)
Lymphocytes Relative: 12 % (ref 12.0–46.0)
MCHC: 33.3 g/dL (ref 30.0–36.0)
Monocytes Relative: 4.1 % (ref 3.0–12.0)
Neutro Abs: 6.5 10*3/uL (ref 1.4–7.7)
RBC: 3.87 Mil/uL (ref 3.87–5.11)

## 2012-12-10 LAB — BASIC METABOLIC PANEL
BUN: 7 mg/dL (ref 6–23)
CO2: 27 mEq/L (ref 19–32)
Chloride: 103 mEq/L (ref 96–112)
Creatinine, Ser: 0.6 mg/dL (ref 0.4–1.2)
Glucose, Bld: 106 mg/dL — ABNORMAL HIGH (ref 70–99)

## 2012-12-10 LAB — HEPATIC FUNCTION PANEL
Albumin: 4.3 g/dL (ref 3.5–5.2)
Bilirubin, Direct: 0.2 mg/dL (ref 0.0–0.3)
Total Protein: 8.6 g/dL — ABNORMAL HIGH (ref 6.0–8.3)

## 2012-12-10 LAB — RHEUMATOID FACTOR: Rhuematoid fact SerPl-aCnc: 12 IU/mL (ref <=14)

## 2012-12-10 LAB — LDL CHOLESTEROL, DIRECT: Direct LDL: 117 mg/dL

## 2012-12-10 LAB — LIPID PANEL: Cholesterol: 225 mg/dL — ABNORMAL HIGH (ref 0–200)

## 2012-12-10 LAB — FOLATE: Folate: 3.9 ng/mL — ABNORMAL LOW (ref >5.9)

## 2012-12-10 MED ORDER — AMLODIPINE BESYLATE 5 MG PO TABS: ORAL_TABLET | ORAL | Status: DC

## 2012-12-10 MED ORDER — CITALOPRAM HYDROBROMIDE 20 MG PO TABS: 20.0000 mg | ORAL_TABLET | Freq: Every day | ORAL | Status: DC

## 2012-12-10 NOTE — Progress Notes (Signed)
Subjective:    Patient ID: Lori Oconnell, female    DOB: 01/02/1961, 52 y.o.   MRN: [id number]  HPI  Here for wellness and f/u;  Overall doing ok;  Pt denies CP, worsening SOB, DOE, wheezing, orthopnea, PND, worsening LE edema, palpitations, dizziness or syncope.  Pt denies neurological change such as new headache, facial or extremity weakness.  Pt denies polydipsia, polyuria, or low sugar symptoms. Pt states overall good compliance with treatment and medications, good tolerability, and has been trying to follow lower cholesterol diet.  Pt denies worsening depressive symptoms, suicidal ideation or panic. No fever, night sweats, wt loss, loss of appetite, or other constitutional symptoms.  Pt states good ability with ADL's, has low fall risk, home safety reviewed and adequate, no other significant changes in hearing or vision, and only occasionally active with exercise.  No overt bleeding or bruising.  No acute complaints  Has not taken BP med in several days Past Medical History  Diagnosis Date  . DISC DISEASE, CERVICAL 07/13/2009  . HEMATOCHEZIA 11/30/2009  . HYPERLIPIDEMIA 07/18/2009  . HYPERTENSION 07/12/2009  . NECK PAIN 07/12/2009  . OVERACTIVE BLADDER 07/12/2009  . RENAL INSUFFICIENCY 07/12/2009  . SINUSITIS- ACUTE-NOS 07/12/2009  . TRANSAMINASES, SERUM, ELEVATED 07/12/2009  . Edema 03/22/2011  . Irregular menses 03/22/2011  . Sleep apnea     she is waiting to be tested   Past Surgical History  Procedure Laterality Date  . S/p ganglion cyst    . Tubal ligation    . Mass excision N/A 08/27/2012    Procedure: EXCISION MASS UPPER BACK;  Surgeon: Clovis Pu. Cornett, MD;  Location: [facility] OR;  Service: General;  Laterality: N/A;    reports that she has quit smoking. She does not have any smokeless tobacco history on file. She reports that  drinks alcohol. She reports that she does not use illicit drugs. family history includes Diabetes in her maternal aunt and Hypertension in her mother. No Known  Allergies Current Outpatient Prescriptions on File Prior to Visit  Medication Sig Dispense Refill  . aspirin 81 MG tablet Take 81 mg by mouth daily.         No current facility-administered medications on file prior to visit.   Review of Systems Constitutional: Negative for diaphoresis, activity change, appetite change or unexpected weight change.  HENT: Negative for hearing loss, ear pain, facial swelling, mouth sores and neck stiffness.   Eyes: Negative for pain, redness and visual disturbance.  Respiratory: Negative for shortness of breath and wheezing.   Cardiovascular: Negative for chest pain and palpitations.  Gastrointestinal: Negative for diarrhea, blood in stool, abdominal distention or other pain Genitourinary: Negative for hematuria, flank pain or change in urine volume.  Musculoskeletal: Negative for myalgias and joint swelling.  Skin: Negative for color change and wound.  Neurological: Negative for syncope and numbness. other than noted Hematological: Negative for adenopathy.  Psychiatric/Behavioral: Negative for hallucinations, self-injury, decreased concentration and agitation.      Objective:   Physical Exam BP 182/120  Pulse 116  Temp(Src) 98 F (36.7 C) (Oral)  Ht 5\' 4"  (1.626 m)  Wt 184 lb 2 oz (83.519 kg)  BMI 31.59 kg/m2  SpO2 96% VS noted,  Constitutional: Pt is oriented to person, place, and time. Appears well-developed and well-nourished.  Head: Normocephalic and atraumatic.  Right Ear: External ear normal.  Left Ear: External ear normal.  Nose: Nose normal.  Mouth/Throat: Oropharynx is clear and moist.  Eyes: Conjunctivae and EOM are normal. Pupils  are equal, round, and reactive to light.  Neck: Normal range of motion. Neck supple. No JVD present. No tracheal deviation present.  Cardiovascular: Normal rate, regular rhythm, normal heart sounds and intact distal pulses.   Pulmonary/Chest: Effort normal and breath sounds normal.  Abdominal: Soft. Bowel  sounds are normal. There is no tenderness. No HSM  Musculoskeletal: Normal range of motion. Exhibits no edema.  Lymphadenopathy:  Has no cervical adenopathy.  Neurological: Pt is alert and oriented to person, place, and time. Pt has normal reflexes. No cranial nerve deficit.  Skin: Skin is warm and dry. No rash noted.  Psychiatric:  Has  normal mood and affect. Behavior is normal.     Assessment & Plan:

## 2012-12-10 NOTE — Patient Instructions (Signed)
Please continue all other medications as before, and refills have been done if requested - to [facility] Please have the pharmacy call with any other refills you may need. Please continue your efforts at being more active, low cholesterol diet, and weight control. You are otherwise up to date with prevention measures today. Please go to the LAB in the Basement (turn left off the elevator) for the tests to be done today You will be contacted by phone if any changes need to be made immediately.  Otherwise, you will receive a letter about your results with an explanation, but please check with MyChart first.  Please remember to sign up for My Chart if you have not done so, as this will be important to you in the future with finding out test results, communicating by private email, and scheduling acute appointments online when needed.  Please return in 6 months, or sooner if needed

## 2012-12-11 LAB — HEPATITIS PANEL, ACUTE
Hep B C IgM: NEGATIVE
Hepatitis B Surface Ag: NEGATIVE

## 2012-12-11 LAB — ANA: Anti Nuclear Antibody(ANA): NEGATIVE

## 2012-12-13 NOTE — Assessment & Plan Note (Signed)
For f/u b12, iron level,  to f/u any worsening symptoms or concerns

## 2012-12-13 NOTE — Assessment & Plan Note (Signed)

## 2012-12-13 NOTE — Assessment & Plan Note (Signed)
Severe elev, for re-start med,  to f/u any worsening symptoms or concerns

## 2013-01-02 ENCOUNTER — Telehealth: Payer: Self-pay | Admitting: Family Medicine

## 2013-01-02 NOTE — Telephone Encounter (Signed)
Spoke with patient regarding her appts with [facility].  Informed her that she is not to go to them for anything regarding her chronic conditions or for any other medical purposes.  Informed her that going to another primary care provder's office is against [facility] policy and that she could be dismissed.  Patient understood and agreed to continue here.  Have an appt for her renewal of OC on Thursday next week.

## 2013-01-09 ENCOUNTER — Ambulatory Visit: Payer: Self-pay

## 2013-04-17 ENCOUNTER — Other Ambulatory Visit: Payer: Self-pay

## 2013-06-16 ENCOUNTER — Ambulatory Visit: Payer: Self-pay | Admitting: Internal Medicine

## 2013-06-17 ENCOUNTER — Ambulatory Visit: Payer: Self-pay | Admitting: Internal Medicine

## 2013-07-02 ENCOUNTER — Ambulatory Visit (INDEPENDENT_AMBULATORY_CARE_PROVIDER_SITE_OTHER): Payer: Medicare Other | Admitting: Internal Medicine

## 2013-07-02 ENCOUNTER — Encounter: Payer: Self-pay | Admitting: Internal Medicine

## 2013-07-02 ENCOUNTER — Other Ambulatory Visit (INDEPENDENT_AMBULATORY_CARE_PROVIDER_SITE_OTHER): Payer: Medicare Other

## 2013-07-02 VITALS — BP 112/80 | HR 98 | Temp 98.0°F | Ht 64.0 in | Wt 188.1 lb

## 2013-07-02 DIAGNOSIS — R350 Frequency of micturition: Secondary | ICD-10-CM | POA: Diagnosis not present

## 2013-07-02 DIAGNOSIS — F418 Other specified anxiety disorders: Secondary | ICD-10-CM

## 2013-07-02 DIAGNOSIS — I10 Essential (primary) hypertension: Secondary | ICD-10-CM

## 2013-07-02 DIAGNOSIS — R232 Flushing: Secondary | ICD-10-CM | POA: Insufficient documentation

## 2013-07-02 DIAGNOSIS — F341 Dysthymic disorder: Secondary | ICD-10-CM

## 2013-07-02 DIAGNOSIS — Z23 Encounter for immunization: Secondary | ICD-10-CM | POA: Diagnosis not present

## 2013-07-02 DIAGNOSIS — Z1231 Encounter for screening mammogram for malignant neoplasm of breast: Secondary | ICD-10-CM | POA: Diagnosis not present

## 2013-07-02 LAB — URINALYSIS, ROUTINE W REFLEX MICROSCOPIC
Bilirubin Urine: NEGATIVE
HGB URINE DIPSTICK: NEGATIVE
Ketones, ur: NEGATIVE
NITRITE: NEGATIVE
PH: 5.5 (ref 5.0–8.0)
Specific Gravity, Urine: >=1.03 — AB (ref 1.000–1.030)
Urine Glucose: NEGATIVE
Urobilinogen, UA: 0.2 (ref 0.0–1.0)

## 2013-07-02 MED ORDER — CEPHALEXIN 500 MG PO CAPS: 500.0000 mg | ORAL_CAPSULE | Freq: Four times a day (QID) | ORAL | Status: DC

## 2013-07-02 MED ORDER — ESCITALOPRAM OXALATE 10 MG PO TABS: 10.0000 mg | ORAL_TABLET | Freq: Every day | ORAL | Status: DC

## 2013-07-02 NOTE — Progress Notes (Signed)
Pre-visit discussion using our clinic review tool. No additional management support is needed unless otherwise documented below in the visit note.  

## 2013-07-02 NOTE — Assessment & Plan Note (Signed)
prob uti by exam - for empiric antibx, for urine studies

## 2013-07-02 NOTE — Assessment & Plan Note (Signed)
Unclear etiology - ? Stress vs UTI v menopause vs other; declines fsh today,  to f/u any worsening symptoms or concerns

## 2013-07-02 NOTE — Progress Notes (Signed)
Subjective:    Patient ID: Lori Oconnell, female    DOB: 08-27-1960, 53 y.o.   MRN: [id number]  HPI  Here to f/u; overall doing ok,  Pt denies chest pain, increased sob or doe, wheezing, orthopnea, PND, increased LE swelling, palpitations, dizziness or syncope.  Pt denies polydipsia, polyuria,.  Pt denies new neurological symptoms such as new headache, or facial or extremity weakness or numbness.   Pt states overall good compliance with meds, has been trying to follow lower cholesterol, diabetic diet, with wt overall stable,  but little exercise however.. Denies worsening depressive symptoms, suicidal ideation, or panic; has ongoing anxiety, some increased recently, celexa not working as well.  Now disabled per [employer] for c-spine. Looking to move out of her home and divorce soon, not yet legally separated. Due for flu shot.  OK for mammogram, wants to hold off on colonoscopy  Plans to return to [place] to see about lipoma surgury Does have urinary symptoms with dysuria and frequency, but no urgency, flank pain, hematuria or n/v, fever, chills - for 3 days, mild. Past Medical History  Diagnosis Date  . [place] DISEASE, CERVICAL 07/13/2009  . HEMATOCHEZIA 11/30/2009  . HYPERLIPIDEMIA 07/18/2009  . HYPERTENSION 07/12/2009  . NECK PAIN 07/12/2009  . OVERACTIVE BLADDER 07/12/2009  . RENAL INSUFFICIENCY 07/12/2009  . SINUSITIS- ACUTE-NOS 07/12/2009  . TRANSAMINASES, SERUM, ELEVATED 07/12/2009  . Edema 03/22/2011  . Irregular menses 03/22/2011  . Sleep apnea     she is waiting to be tested   Past Surgical History  Procedure Laterality Date  . S/p ganglion cyst    . Tubal ligation    . Mass excision N/A 08/27/2012    Procedure: EXCISION MASS UPPER BACK;  Surgeon: Joyice Faster. Cornett, MD;  Location: [facility];  Service: General;  Laterality: N/A;    reports that she has quit smoking. She does not have any smokeless tobacco history on file. She reports that she drinks alcohol. She reports that she does not use illicit  drugs. family history includes Diabetes in her maternal aunt; Hypertension in her mother. No Known Allergies Current Outpatient Prescriptions on File Prior to Visit  Medication Sig Dispense Refill  . amLODipine (NORVASC) 5 MG tablet TAKE 1 TABLET (5 MG TOTAL) BY MOUTH DAILY.  90 tablet  3  . aspirin 81 MG tablet Take 81 mg by mouth daily.         No current facility-administered medications on file prior to visit.   Review of Systems  Constitutional: Negative for unexpected weight change, or unusual diaphoresis  HENT: Negative for tinnitus.   Eyes: Negative for photophobia and visual disturbance.  Respiratory: Negative for choking and stridor.   Gastrointestinal: Negative for vomiting and blood in stool.  Genitourinary: Negative for hematuria and decreased urine volume.  Musculoskeletal: Negative for acute joint swelling Skin: Negative for color change and wound.  Neurological: Negative for tremors and numbness other than noted  Psychiatric/Behavioral: Negative for decreased concentration or  hyperactivity.       Objective:   Physical Exam BP 112/80  Pulse 98  Temp(Src) 98 F (36.7 C) (Oral)  Ht 5\' 4"  (1.626 m)  Wt 188 lb 2 oz (85.333 kg)  BMI 32.28 kg/m2  SpO2 99% VS noted, mildill Constitutional: Pt appears well-developed and well-nourished.  HENT: Head: NCAT.  Right Ear: External ear normal.  Left Ear: External ear normal.  Eyes: Conjunctivae and EOM are normal. Pupils are equal, round, and reactive to light.  Neck: Normal range  of motion. Neck supple.  Cardiovascular: Normal rate and regular rhythm.   Pulmonary/Chest: Effort normal and breath sounds normal.  Abd:  Soft,  non-distended, + BS with tender low mid abd, no flank tender Neurological: Pt is alert. Not confused  Skin: Skin is warm. No erythema.  Psychiatric: Pt behavior is normal. Thought content normal. depressed affectm 1_ nervous    Assessment & Plan:

## 2013-07-02 NOTE — Patient Instructions (Addendum)
You had the flu shot today Ok to stop the celexa Please take all new medication as prescribed  - the antibiotic, and the lexapro Please go to the LAB in the Basement (turn left off the elevator) for the tests to be done today - just the urine testing today You will be contacted by phone if any changes need to be made immediately.  Otherwise, you will receive a letter about your results with an explanation, but please check with MyChart first.  You will be contacted regarding the referral for: mammogram  Please continue your efforts at being more active, low cholesterol diet, and weight control. You are otherwise up to date with prevention measures today.  Please remember to sign up for My Chart if you have not done so, as this will be important to you in the future with finding out test results, communicating by private email, and scheduling acute appointments online when needed.  Please return in 6 months, or sooner if needed

## 2013-07-02 NOTE — Assessment & Plan Note (Signed)
stable overall by history and exam, recent data reviewed with pt, and pt to continue medical treatment as before,  to f/u any worsening symptoms or concerns BP Readings from Last 3 Encounters:  07/02/13 112/80  12/10/12 182/120  11/08/12 110/70

## 2013-07-03 LAB — URINE CULTURE
COLONY COUNT: NO GROWTH
Organism ID, Bacteria: NO GROWTH

## 2013-07-08 NOTE — Assessment & Plan Note (Signed)
Ok to change celexa to lexapro10 qd, declines counseling

## 2013-07-09 ENCOUNTER — Telehealth: Payer: Self-pay

## 2013-07-15 ENCOUNTER — Ambulatory Visit (HOSPITAL_COMMUNITY): Payer: Medicare Other

## 2013-07-22 ENCOUNTER — Ambulatory Visit (HOSPITAL_COMMUNITY): Payer: Medicare Other

## 2013-07-23 ENCOUNTER — Ambulatory Visit (HOSPITAL_COMMUNITY): Admission: RE | Admit: 2013-07-23 | Payer: Medicare Other | Source: Ambulatory Visit

## 2013-09-18 NOTE — Telephone Encounter (Signed)
Error

## 2013-12-30 ENCOUNTER — Ambulatory Visit: Payer: Medicare Other | Admitting: Internal Medicine

## 2013-12-30 DIAGNOSIS — Z0289 Encounter for other administrative examinations: Secondary | ICD-10-CM

## 2013-12-31 ENCOUNTER — Telehealth: Payer: Self-pay | Admitting: Internal Medicine

## 2013-12-31 NOTE — Telephone Encounter (Signed)
Patient no showed for 6 month follow up on 12/30/2013.  Please advise.

## 2014-08-28 IMAGING — CR DG SC JOINTS 3+V
3 series · 3 of 3 positions shown · non-contrast
Comparison: None.

CLINICAL DATA: Right sternoclavicular joint pain

STENOCLAVICULAR JOINTS - 3+ VIEW

[t sterno-clavicular joints (1 of 3)]
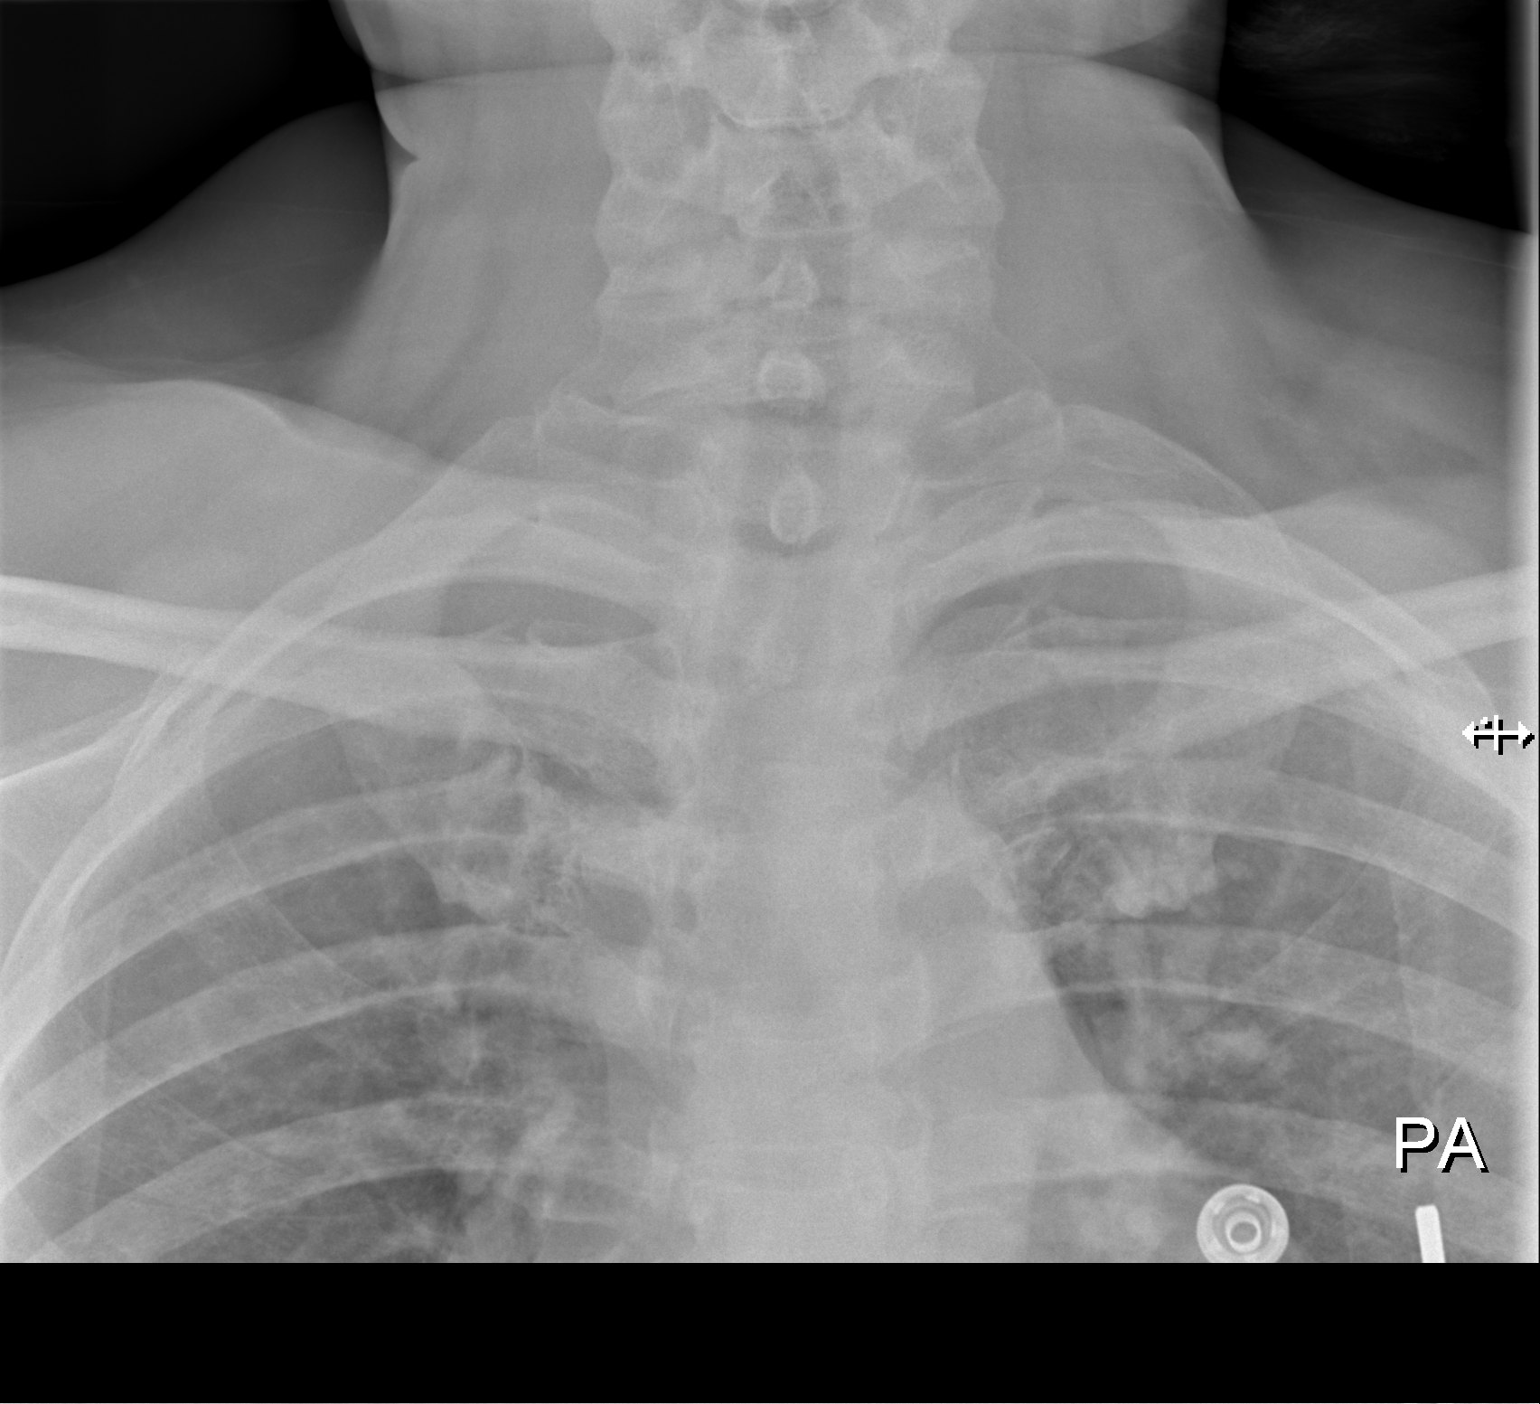

[t sterno-clavicular joints (2 of 3)]
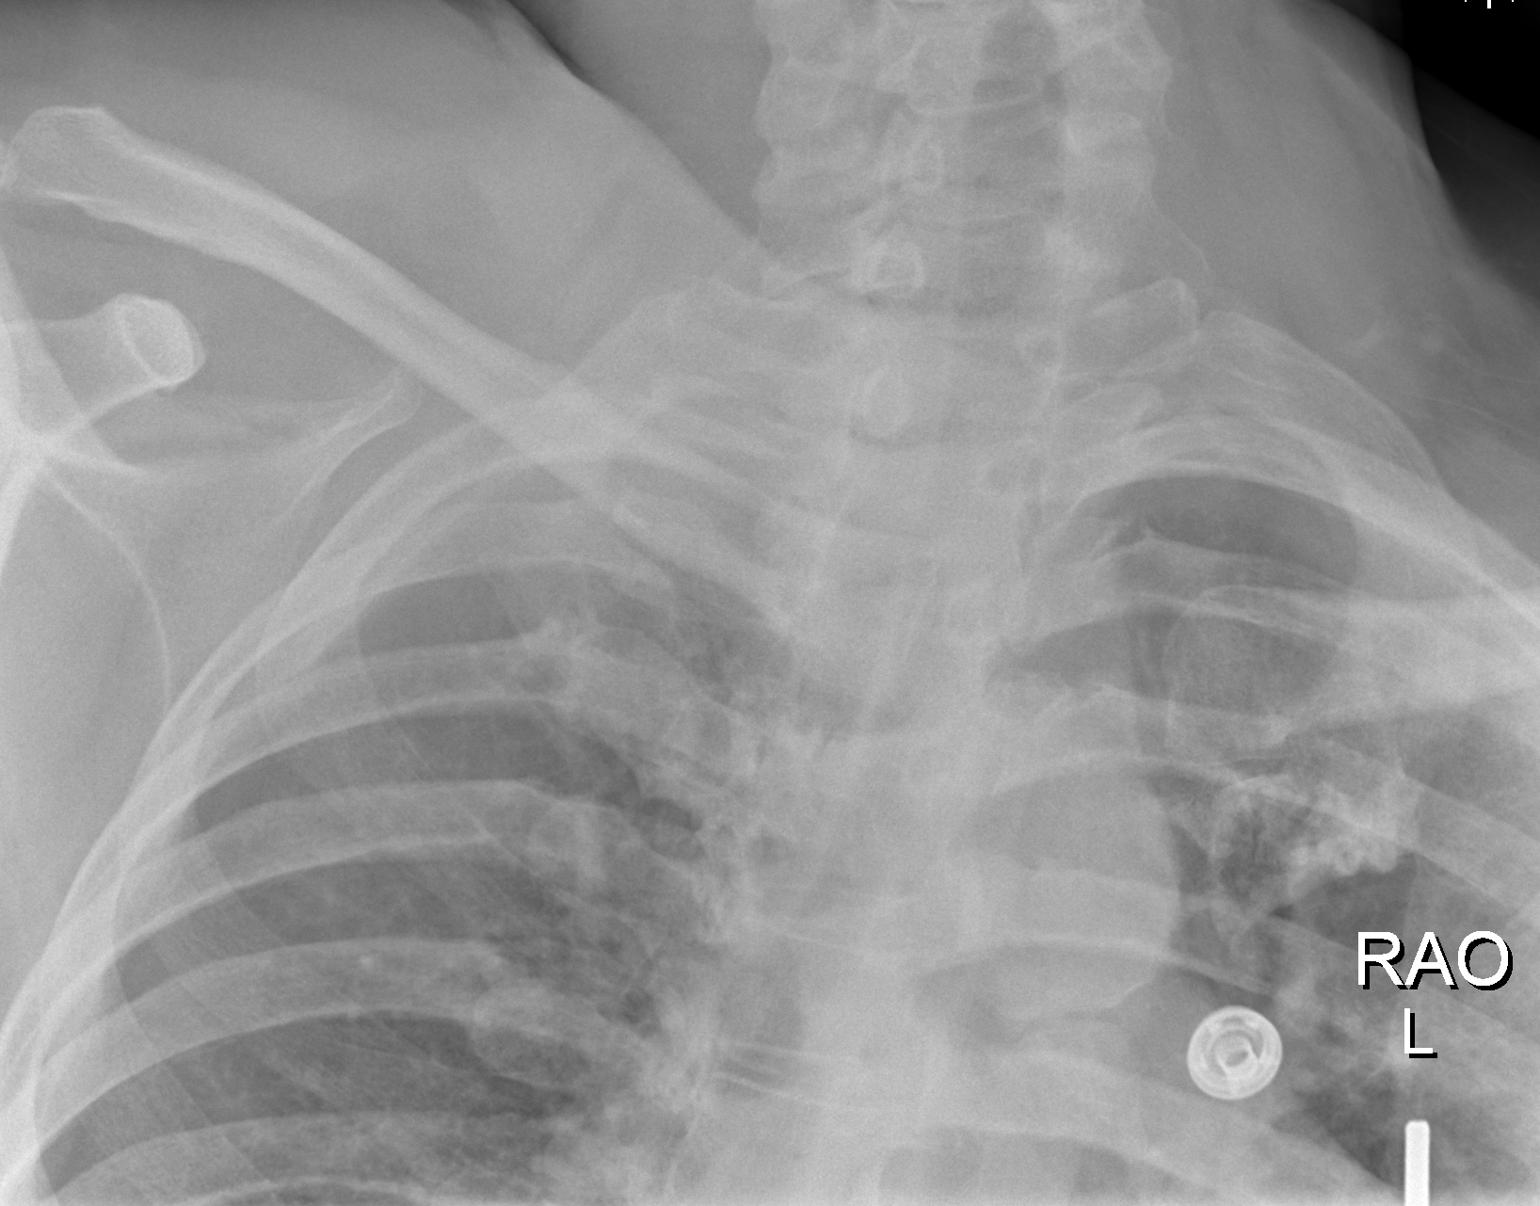

[t sterno-clavicular joints (3 of 3)]
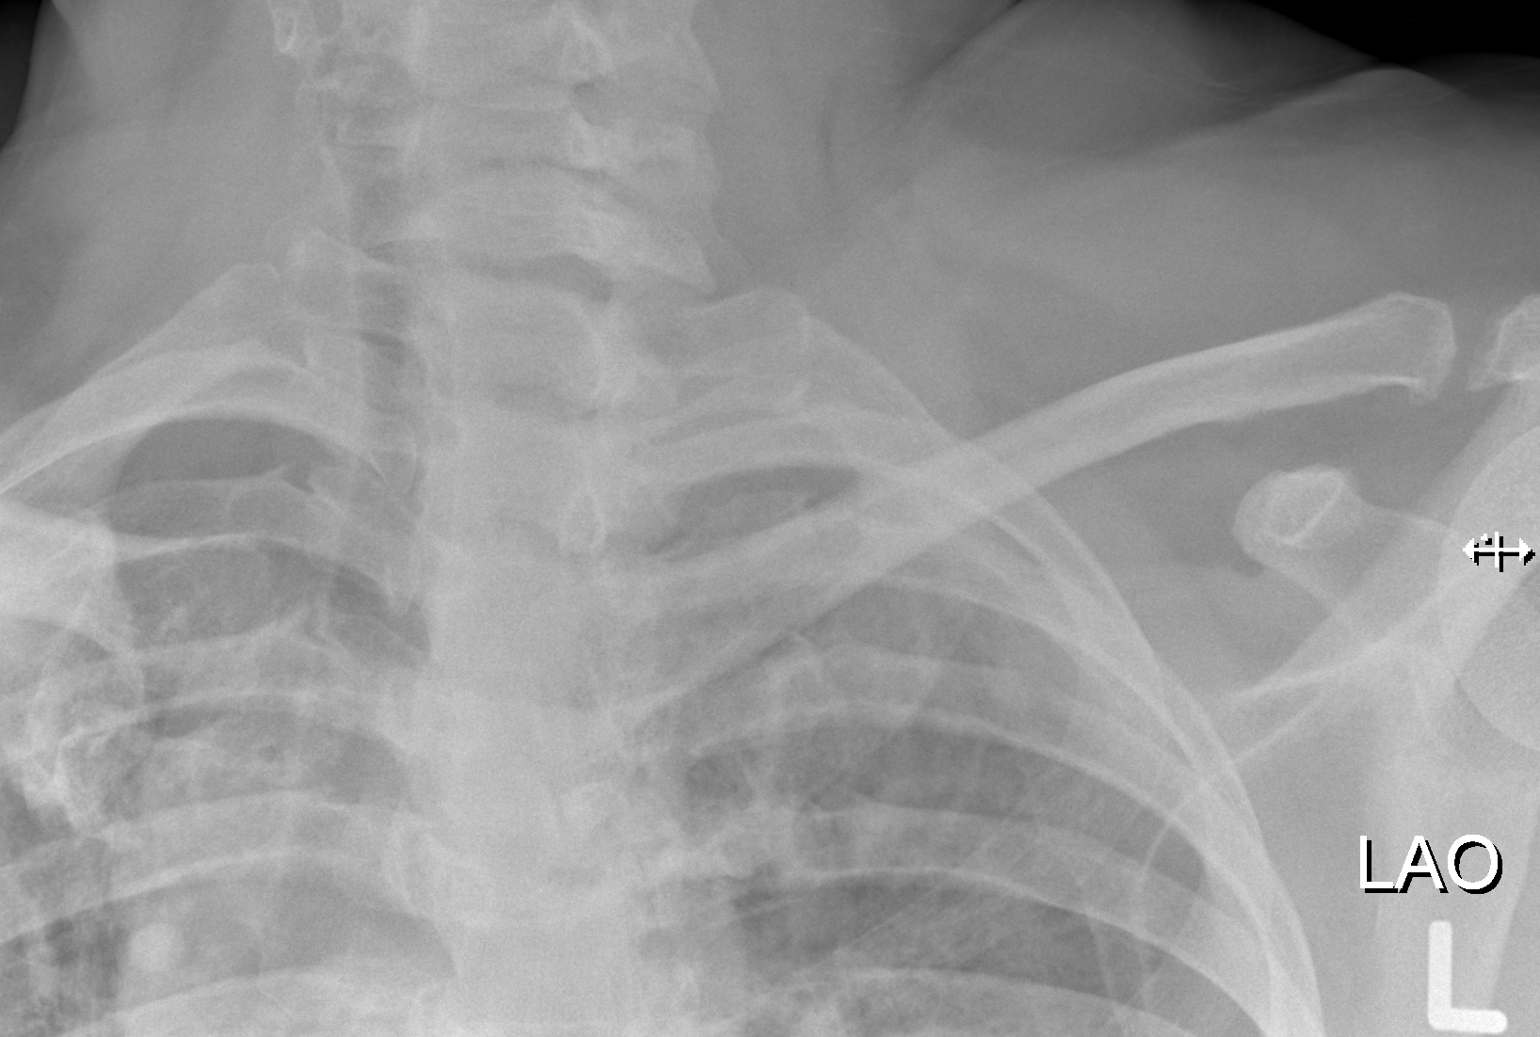

[3 of 3 positions shown; findings below may reference images not displayed]

FINDINGS: There is no evidence of fracture or dislocation.  There
is no evidence of arthropathy or other focal bony abnormality.
Soft tissues are unremarkable.

If there is strong concern for right sternoclavicular joint
pathology, such as septic arthritis or erosive arthropathy, CT
would be recommended as plain films can be relatively insensitive.
IMPRESSION: Negative exam.  See discussion above.

## 2014-11-26 ENCOUNTER — Ambulatory Visit (INDEPENDENT_AMBULATORY_CARE_PROVIDER_SITE_OTHER): Payer: Medicare Other | Admitting: Internal Medicine

## 2014-11-26 ENCOUNTER — Other Ambulatory Visit: Payer: Medicare Other

## 2014-11-26 ENCOUNTER — Encounter: Payer: Self-pay | Admitting: Internal Medicine

## 2014-11-26 VITALS — BP 148/94 | HR 98 | Temp 98.1°F | Ht 64.0 in | Wt 172.0 lb

## 2014-11-26 DIAGNOSIS — R21 Rash and other nonspecific skin eruption: Secondary | ICD-10-CM | POA: Diagnosis not present

## 2014-11-26 DIAGNOSIS — J209 Acute bronchitis, unspecified: Secondary | ICD-10-CM | POA: Diagnosis not present

## 2014-11-26 DIAGNOSIS — R350 Frequency of micturition: Secondary | ICD-10-CM

## 2014-11-26 LAB — POCT URINALYSIS DIPSTICK
Bilirubin, UA: NEGATIVE
GLUCOSE UA: NEGATIVE
Ketones, UA: NEGATIVE
LEUKOCYTES UA: NEGATIVE
NITRITE UA: NEGATIVE
PROTEIN UA: NEGATIVE
RBC UA: NEGATIVE
Spec Grav, UA: 1.01
UROBILINOGEN UA: NEGATIVE
pH, UA: 6

## 2014-11-26 MED ORDER — HYDROCODONE-HOMATROPINE 5-1.5 MG/5ML PO SYRP: 5.0000 mL | ORAL_SOLUTION | Freq: Four times a day (QID) | ORAL | Status: DC | PRN

## 2014-11-26 MED ORDER — TRIAMCINOLONE ACETONIDE 0.1 % EX CREA: 1.0000 "application " | TOPICAL_CREAM | Freq: Two times a day (BID) | CUTANEOUS | Status: DC

## 2014-11-26 MED ORDER — LEVOFLOXACIN 500 MG PO TABS: 500.0000 mg | ORAL_TABLET | Freq: Every day | ORAL | Status: DC

## 2014-11-26 NOTE — Patient Instructions (Signed)
Please take all new medication as prescribed - the antibiotic, cough medicine, and steroid cream  Please continue all other medications as before, and refills have been done if requested.  Please have the pharmacy call with any other refills you may need.  Please keep your appointments with your specialists as you may have planned

## 2014-11-26 NOTE — Assessment & Plan Note (Signed)
C/w dermatitis - for triam cr prn,  to f/u any worsening symptoms or concerns

## 2014-11-26 NOTE — Progress Notes (Signed)
Subjective:    Patient ID: Lori Oconnell, female    DOB: 01/05/1961, 54 y.o.   MRN: [id number]  HPI  Here with acute onset mild to mod 2-3 days ST, HA, general weakness and malaise, with prod cough greenish sputum, but Pt denies chest pain, increased sob or doe, wheezing, orthopnea, PND, increased LE swelling, palpitations, dizziness or syncope.  Also c/o 2-3 days incidental onset urinary symptoms of lower abd discomfort i intermittent, dysuria, frequency, but no urgency, flank pain, hematuria or n/v, fever, chills. Also with itchy oddly symmetrical rash to both legs laterally just above the ankle for several wks, itchy, not better with neosporin, nnothing makes worse Past Medical History  Diagnosis Date  . [place] DISEASE, CERVICAL 07/13/2009  . HEMATOCHEZIA 11/30/2009  . HYPERLIPIDEMIA 07/18/2009  . HYPERTENSION 07/12/2009  . NECK PAIN 07/12/2009  . OVERACTIVE BLADDER 07/12/2009  . RENAL INSUFFICIENCY 07/12/2009  . SINUSITIS- ACUTE-NOS 07/12/2009  . TRANSAMINASES, SERUM, ELEVATED 07/12/2009  . Edema 03/22/2011  . Irregular menses 03/22/2011  . Sleep apnea     she is waiting to be tested   Past Surgical History  Procedure Laterality Date  . S/p ganglion cyst    . Tubal ligation    . Mass excision N/A 08/27/2012    Procedure: EXCISION MASS UPPER BACK;  Surgeon: Joyice Faster. Cornett, MD;  Location: [facility];  Service: General;  Laterality: N/A;    reports that she has quit smoking. She does not have any smokeless tobacco history on file. She reports that she drinks alcohol. She reports that she does not use illicit drugs. family history includes Diabetes in her maternal aunt; Hypertension in her mother. No Known Allergies Current Outpatient Prescriptions on File Prior to Visit  Medication Sig Dispense Refill  . aspirin 81 MG tablet Take 81 mg by mouth daily.      Marland Kitchen amLODipine (NORVASC) 5 MG tablet TAKE 1 TABLET (5 MG TOTAL) BY MOUTH DAILY. (Patient not taking: Reported on 11/26/2014) 90 tablet 3  .  cephALEXin (KEFLEX) 500 MG capsule Take 1 capsule (500 mg total) by mouth 4 (four) times daily. (Patient not taking: Reported on 11/26/2014) 40 capsule 0  . escitalopram (LEXAPRO) 10 MG tablet Take 1 tablet (10 mg total) by mouth daily. 90 tablet 3   No current facility-administered medications on file prior to visit.   Review of Systems  Constitutional: Negative for unusual diaphoresis or night sweats HENT: Negative for ringing in ear or discharge Eyes: Negative for double vision or worsening visual disturbance.  Respiratory: Negative for choking and stridor.   Gastrointestinal: Negative for vomiting or other signifcant bowel change Genitourinary: Negative for hematuria or change in urine volume.  Musculoskeletal: Negative for other MSK pain or swelling Skin: Negative for color change and worsening wound.  Neurological: Negative for tremors and numbness other than noted  Psychiatric/Behavioral: Negative for decreased concentration or agitation other than above       Objective:   Physical Exam BP 148/94 mmHg  Pulse 98  Temp(Src) 98.1 F (36.7 C) (Oral)  Ht 5\' 4"  (1.626 m)  Wt 172 lb (78.019 kg)  BMI 29.51 kg/m2  SpO2 98% VS noted, mild ill Constitutional: Pt appears in no significant distress HENT: Head: NCAT.  Right Ear: External ear normal.  Left Ear: External ear normal.  Bilat tm's with mild erythema.  Max sinus areas mild tender.  Pharynx with mod erythema, no exudate Eyes: . Pupils are equal, round, and reactive to light. Conjunctivae and EOM are  normal Neck: Normal range of motion. Neck supple.  Cardiovascular: Normal rate and regular rhythm.   Pulmonary/Chest: Effort normal and breath sounds without rales or wheezing.  Abd:  Soft, NT, ND, + BS except for low mid abd tender mild without guarding Neurological: Pt is alert. Not confused , motor grossly intact Skin: Skin is warm. No rash, no LE edema, but with bilat lesions to legs just above ankles laterally right > left  of dark itchy lesions  - right approx 2 cm oval dark nonraised nontender, left with 1/2 cm similar lesion, also noted even smaller lesion to right mid post arm Psychiatric: Pt behavior is normal. No agitation.     Assessment & Plan:

## 2014-11-26 NOTE — Progress Notes (Signed)
Pre visit review using our clinic review tool, if applicable. No additional management support is needed unless otherwise documented below in the visit note. 

## 2014-11-26 NOTE — Assessment & Plan Note (Signed)
Mild to mod, for antibx course,  to f/u any worsening symptoms or concerns 

## 2014-11-26 NOTE — Assessment & Plan Note (Signed)
?   uti - also should be Ok with levaquin as above,  to f/u any worsening symptoms or concerns

## 2014-11-27 LAB — URINE CULTURE
COLONY COUNT: NO GROWTH
ORGANISM ID, BACTERIA: NO GROWTH

## 2014-12-17 ENCOUNTER — Telehealth: Payer: Self-pay | Admitting: Internal Medicine

## 2014-12-17 MED ORDER — TRIAMCINOLONE ACETONIDE 0.1 % EX CREA: 1.0000 "application " | TOPICAL_CREAM | Freq: Two times a day (BID) | CUTANEOUS | Status: DC

## 2014-12-17 MED ORDER — LEVOFLOXACIN 500 MG PO TABS: 500.0000 mg | ORAL_TABLET | Freq: Every day | ORAL | Status: DC

## 2014-12-17 NOTE — Telephone Encounter (Signed)
Verified pharmacy. Patient is requesting a fill of levofloxacin (LEVAQUIN) 500 MG tablet [[id number]]  triamcinolone cream (KENALOG) 0.1 % [[id number]]

## 2015-02-17 ENCOUNTER — Ambulatory Visit (HOSPITAL_COMMUNITY)
Admission: RE | Admit: 2015-02-17 | Discharge: 2015-02-17 | Disposition: A | Discharge status: Home / Self Care | Payer: Medicare Other | Source: Ambulatory Visit | Attending: Chiropractic Medicine | Admitting: Chiropractic Medicine

## 2015-02-17 ENCOUNTER — Other Ambulatory Visit (HOSPITAL_COMMUNITY): Payer: Self-pay | Admitting: Chiropractic Medicine

## 2015-02-17 DIAGNOSIS — M542 Cervicalgia: Secondary | ICD-10-CM | POA: Diagnosis not present

## 2015-02-17 DIAGNOSIS — M47892 Other spondylosis, cervical region: Secondary | ICD-10-CM | POA: Insufficient documentation

## 2015-02-22 DIAGNOSIS — L309 Dermatitis, unspecified: Secondary | ICD-10-CM | POA: Diagnosis not present

## 2015-04-19 DIAGNOSIS — M549 Dorsalgia, unspecified: Secondary | ICD-10-CM | POA: Diagnosis not present

## 2015-04-19 DIAGNOSIS — R21 Rash and other nonspecific skin eruption: Secondary | ICD-10-CM | POA: Diagnosis not present

## 2015-04-19 DIAGNOSIS — Z86018 Personal history of other benign neoplasm: Secondary | ICD-10-CM | POA: Diagnosis not present

## 2015-05-12 DIAGNOSIS — M2550 Pain in unspecified joint: Secondary | ICD-10-CM | POA: Diagnosis not present

## 2015-05-12 DIAGNOSIS — L299 Pruritus, unspecified: Secondary | ICD-10-CM | POA: Diagnosis not present

## 2015-05-12 DIAGNOSIS — L83 Acanthosis nigricans: Secondary | ICD-10-CM | POA: Diagnosis not present

## 2015-05-12 DIAGNOSIS — D1730 Benign lipomatous neoplasm of skin and subcutaneous tissue of unspecified sites: Secondary | ICD-10-CM | POA: Diagnosis not present

## 2015-05-12 DIAGNOSIS — F17210 Nicotine dependence, cigarettes, uncomplicated: Secondary | ICD-10-CM | POA: Diagnosis not present

## 2015-05-12 DIAGNOSIS — E7800 Pure hypercholesterolemia, unspecified: Secondary | ICD-10-CM | POA: Diagnosis not present

## 2015-05-12 DIAGNOSIS — F5104 Psychophysiologic insomnia: Secondary | ICD-10-CM | POA: Diagnosis not present

## 2015-05-14 DIAGNOSIS — L239 Allergic contact dermatitis, unspecified cause: Secondary | ICD-10-CM | POA: Diagnosis not present

## 2015-05-14 DIAGNOSIS — M2550 Pain in unspecified joint: Secondary | ICD-10-CM | POA: Diagnosis not present

## 2015-05-14 DIAGNOSIS — F5104 Psychophysiologic insomnia: Secondary | ICD-10-CM | POA: Diagnosis not present

## 2015-05-14 DIAGNOSIS — L83 Acanthosis nigricans: Secondary | ICD-10-CM | POA: Diagnosis not present

## 2015-05-14 DIAGNOSIS — F17210 Nicotine dependence, cigarettes, uncomplicated: Secondary | ICD-10-CM | POA: Diagnosis not present

## 2015-05-14 DIAGNOSIS — D1730 Benign lipomatous neoplasm of skin and subcutaneous tissue of unspecified sites: Secondary | ICD-10-CM | POA: Diagnosis not present

## 2015-05-14 DIAGNOSIS — K759 Inflammatory liver disease, unspecified: Secondary | ICD-10-CM | POA: Diagnosis not present

## 2015-06-25 DIAGNOSIS — E65 Localized adiposity: Secondary | ICD-10-CM | POA: Diagnosis not present

## 2015-07-01 DIAGNOSIS — L818 Other specified disorders of pigmentation: Secondary | ICD-10-CM | POA: Diagnosis not present

## 2015-07-01 DIAGNOSIS — L309 Dermatitis, unspecified: Secondary | ICD-10-CM | POA: Diagnosis not present

## 2015-07-01 DIAGNOSIS — L308 Other specified dermatitis: Secondary | ICD-10-CM | POA: Diagnosis not present

## 2015-07-13 DIAGNOSIS — L83 Acanthosis nigricans: Secondary | ICD-10-CM | POA: Diagnosis not present

## 2015-07-13 DIAGNOSIS — L299 Pruritus, unspecified: Secondary | ICD-10-CM | POA: Diagnosis not present

## 2015-09-01 DIAGNOSIS — L309 Dermatitis, unspecified: Secondary | ICD-10-CM | POA: Diagnosis not present

## 2015-09-01 DIAGNOSIS — L299 Pruritus, unspecified: Secondary | ICD-10-CM | POA: Diagnosis not present

## 2015-09-01 DIAGNOSIS — R7989 Other specified abnormal findings of blood chemistry: Secondary | ICD-10-CM | POA: Diagnosis not present

## 2015-10-22 ENCOUNTER — Ambulatory Visit: Payer: Medicare Other | Admitting: Internal Medicine

## 2015-11-04 ENCOUNTER — Ambulatory Visit (INDEPENDENT_AMBULATORY_CARE_PROVIDER_SITE_OTHER): Payer: Medicare Other | Admitting: Internal Medicine

## 2015-11-04 ENCOUNTER — Other Ambulatory Visit (INDEPENDENT_AMBULATORY_CARE_PROVIDER_SITE_OTHER): Payer: Medicare Other

## 2015-11-04 ENCOUNTER — Encounter: Payer: Self-pay | Admitting: Internal Medicine

## 2015-11-04 VITALS — BP 148/84 | HR 101 | Temp 98.7°F | Ht 65.0 in | Wt 184.0 lb

## 2015-11-04 DIAGNOSIS — R350 Frequency of micturition: Secondary | ICD-10-CM

## 2015-11-04 DIAGNOSIS — Z0000 Encounter for general adult medical examination without abnormal findings: Secondary | ICD-10-CM | POA: Diagnosis not present

## 2015-11-04 DIAGNOSIS — Z1211 Encounter for screening for malignant neoplasm of colon: Secondary | ICD-10-CM | POA: Diagnosis not present

## 2015-11-04 DIAGNOSIS — I10 Essential (primary) hypertension: Secondary | ICD-10-CM | POA: Diagnosis not present

## 2015-11-04 DIAGNOSIS — F418 Other specified anxiety disorders: Secondary | ICD-10-CM | POA: Diagnosis not present

## 2015-11-04 DIAGNOSIS — E785 Hyperlipidemia, unspecified: Secondary | ICD-10-CM | POA: Diagnosis not present

## 2015-11-04 DIAGNOSIS — M25472 Effusion, left ankle: Secondary | ICD-10-CM | POA: Diagnosis not present

## 2015-11-04 DIAGNOSIS — N318 Other neuromuscular dysfunction of bladder: Secondary | ICD-10-CM

## 2015-11-04 LAB — LIPID PANEL
CHOLESTEROL: 212 mg/dL — AB (ref 0–200)
HDL: 50.7 mg/dL (ref >39.00)
NonHDL: 161.41
Total CHOL/HDL Ratio: 4
Triglycerides: 240 mg/dL — ABNORMAL HIGH (ref 0.0–149.0)
VLDL: 48 mg/dL — AB (ref 0.0–40.0)

## 2015-11-04 LAB — CBC WITH DIFFERENTIAL/PLATELET
BASOS PCT: 0.4 % (ref 0.0–3.0)
Basophils Absolute: 0 10*3/uL (ref 0.0–0.1)
EOS PCT: 5.3 % — AB (ref 0.0–5.0)
Eosinophils Absolute: 0.4 10*3/uL (ref 0.0–0.7)
HCT: 37.7 % (ref 36.0–46.0)
HEMOGLOBIN: 12.9 g/dL (ref 12.0–15.0)
LYMPHS ABS: 2.3 10*3/uL (ref 0.7–4.0)
Lymphocytes Relative: 27 % (ref 12.0–46.0)
MCHC: 34.1 g/dL (ref 30.0–36.0)
MCV: 108.2 fl — AB (ref 78.0–100.0)
MONO ABS: 0.6 10*3/uL (ref 0.1–1.0)
MONOS PCT: 6.6 % (ref 3.0–12.0)
NEUTROS PCT: 60.7 % (ref 43.0–77.0)
Neutro Abs: 5.1 10*3/uL (ref 1.4–7.7)
Platelets: 276 10*3/uL (ref 150.0–400.0)
RBC: 3.49 Mil/uL — AB (ref 3.87–5.11)
RDW: 15.1 % (ref 11.5–15.5)
WBC: 8.4 10*3/uL (ref 4.0–10.5)

## 2015-11-04 LAB — BASIC METABOLIC PANEL
BUN: 5 mg/dL — AB (ref 6–23)
CALCIUM: 9.8 mg/dL (ref 8.4–10.5)
CO2: 32 mEq/L (ref 19–32)
Chloride: 104 mEq/L (ref 96–112)
Creatinine, Ser: 0.61 mg/dL (ref 0.40–1.20)
GFR: 131.9 mL/min (ref >60.00)
GLUCOSE: 94 mg/dL (ref 70–99)
POTASSIUM: 3.3 meq/L — AB (ref 3.5–5.1)
Sodium: 141 mEq/L (ref 135–145)

## 2015-11-04 LAB — HEPATIC FUNCTION PANEL
ALBUMIN: 4.3 g/dL (ref 3.5–5.2)
ALK PHOS: 127 U/L — AB (ref 39–117)
ALT: 19 U/L (ref 0–35)
AST: 33 U/L (ref 0–37)
BILIRUBIN TOTAL: 0.8 mg/dL (ref 0.2–1.2)
Bilirubin, Direct: 0.1 mg/dL (ref 0.0–0.3)
Total Protein: 7.7 g/dL (ref 6.0–8.3)

## 2015-11-04 LAB — LDL CHOLESTEROL, DIRECT: Direct LDL: 119 mg/dL

## 2015-11-04 LAB — TSH: TSH: 1.09 u[IU]/mL (ref 0.35–4.50)

## 2015-11-04 MED ORDER — ESCITALOPRAM OXALATE 10 MG PO TABS: 10.0000 mg | ORAL_TABLET | Freq: Every day | ORAL | Status: DC

## 2015-11-04 MED ORDER — AMLODIPINE BESYLATE 5 MG PO TABS: ORAL_TABLET | ORAL | Status: DC

## 2015-11-04 MED ORDER — SOLIFENACIN SUCCINATE 5 MG PO TABS: 5.0000 mg | ORAL_TABLET | Freq: Every day | ORAL | Status: DC

## 2015-11-04 NOTE — Progress Notes (Signed)
Pre visit review using our clinic review tool, if applicable. No additional management support is needed unless otherwise documented below in the visit note. 

## 2015-11-04 NOTE — Assessment & Plan Note (Signed)
Overall stable, but has tried to come off before, asks for refill,  to f/u any worsening symptoms or concerns

## 2015-11-04 NOTE — Assessment & Plan Note (Signed)
Mild persistent symtpoms, for vesicare 5 qd if ok with insurance or other that is covered, for urine studies, consider urology referral,  to f/u any worsening symptoms or concerns \

## 2015-11-04 NOTE — Assessment & Plan Note (Signed)
>   1 mo, persistent lateral, ? Tarsal tunnel - for film today, also f/u Dr Smith/sports med

## 2015-11-04 NOTE — Patient Instructions (Addendum)
Please take all new medication as prescribed - the vesicare (and have the pharmacy call us with an alternative if this is not covered)  Please continue all other medications as before, and refills have been done if requested - the blood pressure pill, and the lexapro  Please have the pharmacy call with any other refills you may need.  Please continue your efforts at being more active, low cholesterol diet, and weight control.  You will be contacted regarding the referral for:  colonoscopy  You are otherwise up to date with prevention measures today.  Please make an appointment with Dr Smith/sports medicine at the scheduling desk as you leave  Please keep your appointments with your specialists as you may have planned  Please go to the XRAY Department in the Basement (go straight as you get off the elevator) for the x-ray testing  Please go to the LAB in the Basement (turn left off the elevator) for the tests to be done today  You will be contacted by phone if any changes need to be made immediately.  Otherwise, you will receive a letter about your results with an explanation, but please check with MyChart first.  Please remember to sign up for MyChart if you have not done so, as this will be important to you in the future with finding out test results, communicating by private email, and scheduling acute appointments online when needed.  Please return in 1 year for your yearly visit, or sooner if needed, with Lab testing done 3-5 days before   [name] , Thank you for taking time to come for your Medicare Wellness Visit. I appreciate your ongoing commitment to your health goals. Please review the following plan we discussed and let me know if I can assist you in the future.   Will fup on colonoscopy  Will schedule eye exam this year  Deferring mammogram due to lipoma;   Pap test recommended q 3 years until 79;   (Will fup by phone for [facility] support from [facility] for financial  assistance  Will consider for CCM as she has multiple risk for heart disease To fup in June )   These are the goals we discussed: Goals    . patient     Patient  Centered goal is to stay on top of her health to the best of her ability        This is a list of the screening recommended for you and due dates:  Health Maintenance  Topic Date Due  . Pap Smear  09/30/1983  . Mammogram  09/29/2012  . Colon Cancer Screening  09/29/2012  . Flu Shot  01/11/2016  . Tetanus Vaccine  07/13/2019  .  Hepatitis C: One time screening is recommended by Center for Disease Control  (CDC) for  adults born from [birth year] through 1963.   Completed  . HIV Screening  Completed    Menopause is a normal process in which your reproductive ability comes to an end. This process happens gradually over a span of months to years, usually between the ages of 68 and 60. Menopause is complete when you have missed 12 consecutive menstrual periods. It is important to talk with your health care provider about some of the most common conditions that affect postmenopausal women, such as heart disease, cancer, and bone loss (osteoporosis). Adopting a healthy lifestyle and getting preventive care can help to promote your health and wellness. Those actions can also lower your chances of developing  some of these common conditions. WHAT SHOULD I KNOW ABOUT MENOPAUSE? During menopause, you may experience a number of symptoms, such as:  Moderate-to-severe hot flashes.  Night sweats.  Decrease in sex drive.  Mood swings.  Headaches.  Tiredness.  Irritability.  Memory problems.  Insomnia. Choosing to treat or not to treat menopausal changes is an individual decision that you make with your health care provider. WHAT SHOULD I KNOW ABOUT HORMONE REPLACEMENT THERAPY AND SUPPLEMENTS? Hormone therapy products are effective for treating symptoms that are associated with menopause, such as hot flashes and night sweats. Hormone  replacement carries certain risks, especially as you become older. If you are thinking about using estrogen or estrogen with progestin treatments, discuss the benefits and risks with your health care provider. WHAT SHOULD I KNOW ABOUT HEART DISEASE AND STROKE? Heart disease, heart attack, and stroke become more likely as you age. This may be due, in part, to the hormonal changes that your body experiences during menopause. These can affect how your body processes dietary fats, triglycerides, and cholesterol. Heart attack and stroke are both medical emergencies. There are many things that you can do to help prevent heart disease and stroke:  Have your blood pressure checked at least every 1-2 years. High blood pressure causes heart disease and increases the risk of stroke.  If you are 41-73 years old, ask your health care provider if you should take aspirin to prevent a heart attack or a stroke.  Do not use any tobacco products, including cigarettes, chewing tobacco, or electronic cigarettes. If you need help quitting, ask your health care provider.  It is important to eat a healthy diet and maintain a healthy weight.  Be sure to include plenty of vegetables, fruits, low-fat dairy products, and lean protein.  Avoid eating foods that are high in solid fats, added sugars, or salt (sodium).  Get regular exercise. This is one of the most important things that you can do for your health.  Try to exercise for at least 150 minutes each week. The type of exercise that you do should increase your heart rate and make you sweat. This is known as moderate-intensity exercise.  Try to do strengthening exercises at least twice each week. Do these in addition to the moderate-intensity exercise.  Know your numbers.Ask your health care provider to check your cholesterol and your blood glucose. Continue to have your blood tested as directed by your health care provider. WHAT SHOULD I KNOW ABOUT CANCER  SCREENING? There are several types of cancer. Take the following steps to reduce your risk and to catch any cancer development as early as possible. Breast Cancer  Practice breast self-awareness.  This means understanding how your breasts normally appear and feel.  It also means doing regular breast self-exams. Let your health care provider know about any changes, no matter how small.  If you are 98 or older, have a clinician do a breast exam (clinical breast exam or CBE) every year. Depending on your age, family history, and medical history, it may be recommended that you also have a yearly breast X-ray (mammogram).  If you have a family history of breast cancer, talk with your health care provider about genetic screening.  If you are at high risk for breast cancer, talk with your health care provider about having an MRI and a mammogram every year.  Breast cancer (BRCA) gene test is recommended for women who have family members with BRCA-related cancers. Results of the assessment will determine  the need for genetic counseling and BRCA1 and for BRCA2 testing. BRCA-related cancers include these types:  Breast. This occurs in males or females.  Ovarian.  Tubal. This may also be called fallopian tube cancer.  Cancer of the abdominal or pelvic lining (peritoneal cancer).  Prostate.  Pancreatic. Cervical, Uterine, and Ovarian Cancer Your health care provider may recommend that you be screened regularly for cancer of the pelvic organs. These include your ovaries, uterus, and vagina. This screening involves a pelvic exam, which includes checking for microscopic changes to the surface of your cervix (Pap test).  For women ages 21-65, health care providers may recommend a pelvic exam and a Pap test every three years. For women ages 24-65, they may recommend the Pap test and pelvic exam, combined with testing for human papilloma virus (HPV), every five years. Some types of HPV increase your  risk of cervical cancer. Testing for HPV may also be done on women of any age who have unclear Pap test results.  Other health care providers may not recommend any screening for nonpregnant women who are considered low risk for pelvic cancer and have no symptoms. Ask your health care provider if a screening pelvic exam is right for you.  If you have had past treatment for cervical cancer or a condition that could lead to cancer, you need Pap tests and screening for cancer for at least 20 years after your treatment. If Pap tests have been discontinued for you, your risk factors (such as having a new sexual partner) need to be reassessed to determine if you should start having screenings again. Some women have medical problems that increase the chance of getting cervical cancer. In these cases, your health care provider may recommend that you have screening and Pap tests more often.  If you have a family history of uterine cancer or ovarian cancer, talk with your health care provider about genetic screening.  If you have vaginal bleeding after reaching menopause, tell your health care provider.  There are currently no reliable tests available to screen for ovarian cancer. Lung Cancer Lung cancer screening is recommended for adults 55-62 years old who are at high risk for lung cancer because of a history of smoking. A yearly low-dose CT scan of the lungs is recommended if you:  Currently smoke.  Have a history of at least 30 pack-years of smoking and you currently smoke or have quit within the past 15 years. A pack-year is smoking an average of one pack of cigarettes per day for one year. Yearly screening should:  Continue until it has been 15 years since you quit.  Stop if you develop a health problem that would prevent you from having lung cancer treatment. Colorectal Cancer  This type of cancer can be detected and can often be prevented.  Routine colorectal cancer screening usually begins  at age 57 and continues through age 78.  If you have risk factors for colon cancer, your health care provider may recommend that you be screened at an earlier age.  If you have a family history of colorectal cancer, talk with your health care provider about genetic screening.  Your health care provider may also recommend using home test kits to check for hidden blood in your stool.  A small camera at the end of a tube can be used to examine your colon directly (sigmoidoscopy or colonoscopy). This is done to check for the earliest forms of colorectal cancer.  Direct examination of the colon should  be repeated every 5-10 years until age 31. However, if early forms of precancerous polyps or small growths are found or if you have a family history or genetic risk for colorectal cancer, you may need to be screened more often. Skin Cancer  Check your skin from head to toe regularly.  Monitor any moles. Be sure to tell your health care provider:  About any new moles or changes in moles, especially if there is a change in a mole's shape or color.  If you have a mole that is larger than the size of a pencil eraser.  If any of your family members has a history of skin cancer, especially at a young age, talk with your health care provider about genetic screening.  Always use sunscreen. Apply sunscreen liberally and repeatedly throughout the day.  Whenever you are outside, protect yourself by wearing long sleeves, pants, a wide-brimmed hat, and sunglasses. WHAT SHOULD I KNOW ABOUT OSTEOPOROSIS? Osteoporosis is a condition in which bone destruction happens more quickly than new bone creation. After menopause, you may be at an increased risk for osteoporosis. To help prevent osteoporosis or the bone fractures that can happen because of osteoporosis, the following is recommended:  If you are 34-67 years old, get at least 1,000 mg of calcium and at least 600 mg of vitamin D per day.  If you are older  than age 66 but younger than age 71, get at least 1,200 mg of calcium and at least 600 mg of vitamin D per day.  If you are older than age 52, get at least 1,200 mg of calcium and at least 800 mg of vitamin D per day. Smoking and excessive alcohol intake increase the risk of osteoporosis. Eat foods that are rich in calcium and vitamin D, and do weight-bearing exercises several times each week as directed by your health care provider. WHAT SHOULD I KNOW ABOUT HOW MENOPAUSE AFFECTS [place]? Depression may occur at any age, but it is more common as you become older. Common symptoms of depression include:  Low or sad mood.  Changes in sleep patterns.  Changes in appetite or eating patterns.  Feeling an overall lack of motivation or enjoyment of activities that you previously enjoyed.  Frequent crying spells. Talk with your health care provider if you think that you are experiencing depression. WHAT SHOULD I KNOW ABOUT IMMUNIZATIONS? It is important that you get and maintain your immunizations. These include:  Tetanus, diphtheria, and pertussis (Tdap) booster vaccine.  Influenza every year before the flu season begins.  Pneumonia vaccine.  Shingles vaccine. Your health care provider may also recommend other immunizations.   This information is not intended to replace advice given to you by your health care provider. Make sure you discuss any questions you have with your health care provider.   Document Released: 07/21/2005 Document Revised: 06/19/2014 Document Reviewed: 01/29/2014 Elsevier Interactive Patient Education [employer].

## 2015-11-04 NOTE — Progress Notes (Signed)
Subjective:    Patient ID: Lori Oconnell, female    DOB: 1961/03/07, 55 y.o.   MRN: [id number]  HPI  Here for yearly f/u;  Last seen here July 2014, Overall doing ok;  Pt denies Chest pain, worsening SOB, DOE, wheezing, orthopnea, PND, worsening LE edema, palpitations, dizziness or syncope.  Pt denies neurological change such as new headache, facial or extremity weakness.  Pt denies polydipsia, polyuria, or low sugar symptoms. Pt states overall good compliance with treatment and medications, good tolerability, and has been trying to follow appropriate diet.  Pt denies worsening depressive symptoms, suicidal ideation or panic. No fever, night sweats, wt loss, loss of appetite, or other constitutional symptoms.  Pt states good ability with ADL's, has low fall risk, home safety reviewed and adequate, no other significant changes in hearing or vision, and only occasionally active with exercise.  Mentions ongoing issue with OAB, needs med tx, Denies urinary symptoms such as dysuria, flank pain, hematuria or n/v, fever, chills, just mostly freq and urgency recurrent no change. Bp < 140/9o at home, takes every day.   Some nervous today.  Did have left ankle turn 4 wks ago, some improved to start, then worse daily to walk on it, with recurrent/,mostly persistent lateral aspect pain and swelling.  No other trauma, fever or foot involvement.   Past Medical History  Diagnosis Date  . [place] DISEASE, CERVICAL 07/13/2009  . HEMATOCHEZIA 11/30/2009  . HYPERLIPIDEMIA 07/18/2009  . HYPERTENSION 07/12/2009  . NECK PAIN 07/12/2009  . OVERACTIVE BLADDER 07/12/2009  . RENAL INSUFFICIENCY 07/12/2009  . SINUSITIS- ACUTE-NOS 07/12/2009  . TRANSAMINASES, SERUM, ELEVATED 07/12/2009  . Edema 03/22/2011  . Irregular menses 03/22/2011  . Sleep apnea     she is waiting to be tested   Past Surgical History  Procedure Laterality Date  . S/p ganglion cyst    . Tubal ligation    . Mass excision N/A 08/27/2012    Procedure: EXCISION MASS  UPPER BACK;  Surgeon: Joyice Faster. Cornett, MD;  Location: [facility];  Service: General;  Laterality: N/A;    reports that she has quit smoking. Her smoking use included Cigarettes. She has a 15 pack-year smoking history. She does not have any smokeless tobacco history on file. She reports that she drinks alcohol. She reports that she does not use illicit drugs. family history includes Diabetes in her maternal aunt; Hypertension in her mother. No Known Allergies Current Outpatient Prescriptions on File Prior to Visit  Medication Sig Dispense Refill  . aspirin 81 MG tablet Take 81 mg by mouth daily.       No current facility-administered medications on file prior to visit.   Review of Systems Constitutional: Negative for increased diaphoresis, or other activity, appetite or siginficant weight change other than noted HENT: Negative for worsening hearing loss, ear pain, facial swelling, mouth sores and neck stiffness.   Eyes: Negative for other worsening pain, redness or visual disturbance.  Respiratory: Negative for choking or stridor Cardiovascular: Negative for other chest pain and palpitations.  Gastrointestinal: Negative for worsening diarrhea, blood in stool, or abdominal distention Genitourinary: Negative for hematuria, flank pain or change in urine volume.  Musculoskeletal: Negative for myalgias or other joint complaints.  Skin: Negative for other color change and wound or drainage.  Neurological: Negative for syncope and numbness. other than noted Hematological: Negative for adenopathy. or other swelling Psychiatric/Behavioral: Negative for hallucinations, SI, self-injury, decreased concentration or other worsening agitation.      Objective:  Physical Exam BP 148/84 mmHg  Pulse 101  Temp(Src) 98.7 F (37.1 C)  Ht 5\' 5"  (1.651 m)  Wt 184 lb (83.462 kg)  BMI 30.62 kg/m2  SpO2 98% VS noted, not ill Constitutional: Pt is oriented to person, place, and time. Appears well-developed and  well-nourished, in no significant distress Head: Normocephalic and atraumatic  Eyes: Conjunctivae and EOM are normal. Pupils are equal, round, and reactive to light Right Ear: External ear normal.  Left Ear: External ear normal Nose: Nose normal.  Mouth/Throat: Oropharynx is clear and moist  Neck: Normal range of motion. Neck supple. No JVD present. No tracheal deviation present or significant neck LA or mass Cardiovascular: Normal rate, regular rhythm, normal heart sounds and intact distal pulses.   Pulmonary/Chest: Effort normal and breath sounds without rales or wheezing  Abdominal: Soft. Bowel sounds are normal. NT. No HSM  Musculoskeletal: Normal range of motion. Exhibits no edema Lymphadenopathy: Has no cervical adenopathy.  Neurological: Pt is alert and oriented to person, place, and time. Pt has normal reflexes. No cranial nerve deficit. Motor grossly intact Left ankle with reduced ROM, lateral 2+ tender/swelling o/w foot neurovasc intact Skin: Skin is warm and dry. No rash noted or new ulcers Psychiatric:  Has mild nervous mood and affect. Behavior is normal.     Assessment & Plan:

## 2015-11-04 NOTE — Progress Notes (Signed)
Subjective:   Lori Oconnell is a 55 y.o. female who presents for Medicare Annual (Subsequent) preventive examination.  Review of Systems:  HRA assessment completed during visit; Lori Oconnell   The Patient was informed that this wellness visit is to identify risk and educate on how to reduce risk for increase disease through lifestyle changes.   ROS deferred to CPE exam with physician completed today Family and medical hx given below;  Mother had HTN; Aunt DM   Acute today Continuation of urinary frequency; Allergy symptoms  Left ankle swollen; over left ankle; outer aspect, does not hurt; has put ice on it, it will go down but then swell again;    Psychosocial Lives with spouse; 2 children; dtr is 24 and son is 68; lives here 2 grand dtrs Tobacco: quit but smokes occasionally; not every day but when under stress;  Feels like she can manage once surgery has been completed   ETOH drink a beer every now and then  Medication review/ Adherent;   BMI: 30.6   Diet;   Eat breakfast; have cereal Lunch sandwich for lunch with banana Dinner; bake at dinner; red meat; chicken;  Salad   Exercise;   Tries to walk at the park; 20 minutes  Has bicycle; rides bike x 2 times a week   HOME SAFETY; one level  No falls; No  Removal of clutter clearing paths through the home,   Railing as needed discussed  Separate shower   Community safety; YES Smoke detectors YES Firearms safety reviewed and will keep in a safe place if these exist. Driving accidents; no; one but not her fault;   Advised to use sun protection or large brim hat; yes  Stressors (1-5); significant at times  Financial issues; Coverage for surgery that she needs to get better ( is waiting to go to [facility] for removal of large lipoma involving arms chest and back) is deferring mammogram due to this.   Depression: no   Goal; To care for self   Cognitive; memory  Manages checkbook, medications; no failures of task/  denies memory issues and none noted today;  Ad8 score reviewed for issues;  Issues making decisions; no  Less interest in hobbies / activities" no  Repeats questions, stories; family complaining: NO  Trouble using ordinary gadgets; microwave; computer: no  Forgets the month or year: no  Mismanaging finances: no  Missing apt: no but does write them down  Daily problems with thinking of memory NO   Mobilization and Functional losses from last year to this year? no  Sleep pattern changes; off and on   Urinary or fecal incontinence reviewed: urinary being addressed today; med ordered;   Counseling Health Maintenance Colonoscopy; DUE and to refer to GI by Dr. Jenny Reichmann  EKG: defer  Mammogram: hold due to lipoma  Dexa/ <65  PAP: no; GYN: recommended pap test q 3 years; call if she needs a referral   Ophthalmology exam: Due as well / [facility]/ 06  Will schedule as she can afford to do so  Immunizations Due: none due today  Advanced Directive; not at this time; defer   Health Recommendations and Referrals fup with SW regarding needs; will outreach TPN to see if they can assist with understanding of medicare and options. Limited financially   Barriers to Success Financial barriers; awaiting coverage for surgery    Current Care Team reviewed and updated      Objective:     Vitals: BP  148/84 mmHg  Pulse 101  Temp(Src) 98.7 F (37.1 C)  Ht 5\' 5"  (1.651 m)  Wt 184 lb (83.462 kg)  BMI 30.62 kg/m2  SpO2 98%  Body mass index is 30.62 kg/(m^2).   Tobacco History  Smoking status  . Former Smoker -- 3.00 packs/day for 5 years  . Types: Cigarettes  Smokeless tobacco  . Not on file    Comment: none since 2002; smokes very seldom but does when under stress     Counseling given: Yes   Past Medical History  Diagnosis Date  . [place] DISEASE, CERVICAL 07/13/2009  . HEMATOCHEZIA 11/30/2009  . HYPERLIPIDEMIA 07/18/2009  . HYPERTENSION 07/12/2009  . NECK PAIN  07/12/2009  . OVERACTIVE BLADDER 07/12/2009  . RENAL INSUFFICIENCY 07/12/2009  . SINUSITIS- ACUTE-NOS 07/12/2009  . TRANSAMINASES, SERUM, ELEVATED 07/12/2009  . Edema 03/22/2011  . Irregular menses 03/22/2011  . Sleep apnea     she is waiting to be tested   Past Surgical History  Procedure Laterality Date  . S/p ganglion cyst    . Tubal ligation    . Mass excision N/A 08/27/2012    Procedure: EXCISION MASS UPPER BACK;  Surgeon: Joyice Faster. Cornett, MD;  Location: [facility];  Service: General;  Laterality: N/A;   Family History  Problem Relation Age of Onset  . Hypertension Mother   . Diabetes Maternal Aunt    History  Sexual Activity  . Sexual Activity: Not on file    Outpatient Encounter Prescriptions as of 11/04/2015  Medication Sig  . amLODipine (NORVASC) 5 MG tablet TAKE 1 TABLET (5 MG TOTAL) BY MOUTH DAILY.  Marland Kitchen aspirin 81 MG tablet Take 81 mg by mouth daily.    . [DISCONTINUED] amLODipine (NORVASC) 5 MG tablet TAKE 1 TABLET (5 MG TOTAL) BY MOUTH DAILY.  Marland Kitchen escitalopram (LEXAPRO) 10 MG tablet Take 1 tablet (10 mg total) by mouth daily.  . solifenacin (VESICARE) 5 MG tablet Take 1 tablet (5 mg total) by mouth daily.  . [DISCONTINUED] escitalopram (LEXAPRO) 10 MG tablet Take 1 tablet (10 mg total) by mouth daily.  . [DISCONTINUED] HYDROcodone-homatropine (HYCODAN) 5-1.5 MG/5ML syrup Take 5 mLs by mouth every 6 (six) hours as needed for cough. (Patient not taking: Reported on 11/04/2015)  . [DISCONTINUED] levofloxacin (LEVAQUIN) 500 MG tablet Take 1 tablet (500 mg total) by mouth daily.  . [DISCONTINUED] triamcinolone cream (KENALOG) 0.1 % Apply 1 application topically 2 (two) times daily. (Patient not taking: Reported on 11/04/2015)   No facility-administered encounter medications on file as of 11/04/2015.    Activities of Daily Living In your present state of health, do you have any difficulty performing the following activities: 11/04/2015  Hearing? N  Vision? N  Difficulty  concentrating or making decisions? N  Walking or climbing stairs? N  Dressing or bathing? N  Doing errands, shopping? N  Preparing Food and eating ? N  Using the Toilet? N  In the past six months, have you accidently leaked urine? N  Do you have problems with loss of bowel control? N  Managing your Medications? N  Managing your Finances? Y  Housekeeping or managing your Housekeeping? N    Patient Care Team: [name], MD as PCP - General    Assessment:      Exercise Activities and Dietary recommendations Current Exercise Habits: Home exercise routine, Type of exercise: walking, Frequency (Times/Week): 3 (on hold due to ankle pain)  Goals    . patient     Patient  Centered goal is to stay on top of her health to the best of her ability       Fall Risk Fall Risk  11/04/2015 11/04/2015 07/02/2013  Falls in the past year? No No No   Depression Screen PHQ 2/9 Scores 11/04/2015 11/04/2015 07/02/2013  PHQ - 2 Score 0 0 1     Cognitive Testing No flowsheet data found.  Immunization History  Administered Date(s) Administered  . Influenza Split 03/22/2011  . Influenza Whole 07/12/2009  . Influenza,inj,Quad PF,36+ Mos 07/02/2013  . Td 07/12/2009   Screening Tests Health Maintenance  Topic Date Due  . PAP SMEAR  09/30/1983  . MAMMOGRAM  09/29/2012  . COLONOSCOPY  09/29/2012  . INFLUENZA VACCINE  01/11/2016  . TETANUS/TDAP  07/13/2019  . Hepatitis C Screening  Completed  . HIV Screening  Completed      Plan:   Will fup by phone to refer to SW for assistance with finances or understanding medicare; Needs surgery to move on with her life.  O/d Screens discussed and will attempt to get these competed   Smoking hx; still smokes occasional which weight, chol, BP and smoking places her at greater risk; will call in early June and see if referral to [facility] is possible; will also fup regarding AD; cardiac risk and see if she is interested in making changes.    During the  course of the visit the patient was educated and counseled about the following appropriate screening and preventive services:   Vaccines to include Pneumoccal, Influenza, Hepatitis B, Td, Zostavax, HCV  Electrocardiogram/ deferred  Cardiovascular Disease/ see notes; at risk and in need of lifestyle changes; will call and fup   Colorectal cancer screening-referral to GI today  Bone density screening/ to early  Diabetes screening/ labs to be drawn today  Glaucoma screening/ needs vision exam and fup  Mammography/ putting off due to large lipoma on chest;   Nutrition counseling / weight up; states she bakes and tries to eat healthy  Patient Instructions (the written plan) was given to the patient.   Wynetta Fines, RN  11/04/2015

## 2015-11-04 NOTE — Assessment & Plan Note (Signed)
stable overall by history and exam, recent data reviewed with pt, and pt to continue medical treatment as before,  to f/u any worsening symptoms or concerns BP Readings from Last 3 Encounters:  11/04/15 148/84  11/26/14 148/94  07/02/13 112/80

## 2015-11-04 NOTE — Assessment & Plan Note (Signed)
stable overall by history and exam, recent data reviewed with pt, and pt to continue medical treatment as before,  to f/u any worsening symptoms or concerns No results found for: LDLCALC For f/u labs 

## 2015-11-05 ENCOUNTER — Other Ambulatory Visit (INDEPENDENT_AMBULATORY_CARE_PROVIDER_SITE_OTHER): Payer: Medicare Other

## 2015-11-05 DIAGNOSIS — E785 Hyperlipidemia, unspecified: Secondary | ICD-10-CM

## 2015-11-05 LAB — URINALYSIS, ROUTINE W REFLEX MICROSCOPIC
BILIRUBIN URINE: NEGATIVE
HGB URINE DIPSTICK: NEGATIVE
Ketones, ur: NEGATIVE
Nitrite: NEGATIVE
Specific Gravity, Urine: 1.025 (ref 1.000–1.030)
TOTAL PROTEIN, URINE-UPE24: NEGATIVE
URINE GLUCOSE: NEGATIVE
pH: 5.5 (ref 5.0–8.0)

## 2015-11-18 ENCOUNTER — Telehealth: Payer: Self-pay

## 2015-11-18 NOTE — Telephone Encounter (Signed)
Call rec'd from Lori Oconnell; Agreed to [facility] referral to see if they can assist in coordinating her medical issues. Financial barriers as well as access to needed health care. SW to assess if available  [facility] faxed referral; Ms. Ohlmann agreed to outreach

## 2015-11-19 ENCOUNTER — Other Ambulatory Visit: Payer: Self-pay

## 2015-11-19 DIAGNOSIS — I10 Essential (primary) hypertension: Secondary | ICD-10-CM

## 2015-11-19 NOTE — Patient Outreach (Addendum)
[facility]) Care Management  11/19/2015  Lori Oconnell 1960-07-15 [id number]   Telephone Screen  Referral Date:11/18/15 Referral Source: MD office (Dr. Cathlean Cower) Referral Reason: "patient has financial issues, lipoma-grossly large area on back and shoulder(creeping fat)"   Outreach attempt #1 to patient. Spoke with patient.  Social: She resides in her home along with her spouse. She is independent with all ADLs/IADLs. Denies any recent falls. No DME in the home.   Conditions: Patient has h/o: overactive bladder, depression, anxiety, HLD, HTN, RI, sleep apnea and cervical disc disease. She is monitoring BP in the home. She voices that BP is controlled at present. She states that she had surgery on liopma area to back in 2014 but needs to see specialist and further work up and possible surgery.  Medications: Patient reports she only takes one med(Norvasc). Denies any issues with affordability or managing meds.   Appointments: She saw PCP last on 11/04/15.   Consent: Patient agreeable to SW referral only for possible assistance with financial issues. She states that she want so see if she can fer Medicaid or supplemental insurance to offset some of her medical expenses. Patient disclosed that her monthly check was $704 but would not disclose spouse info. She reported she does not want to put spouse info on any applications that she fills out.   Plan: RN CM will send [facility] SW referral for financial assessment and possible assistance.  Enzo Montgomery, RN,BSN,CCM [place] [facility] Telephonic Care Management Coordinator Direct Phone: [phone] Toll Free: [phone] Fax: [phone]

## 2015-12-28 ENCOUNTER — Encounter: Payer: Self-pay | Admitting: Internal Medicine

## 2016-01-05 ENCOUNTER — Encounter: Payer: Self-pay | Admitting: *Deleted

## 2016-01-10 ENCOUNTER — Other Ambulatory Visit: Payer: Self-pay | Admitting: *Deleted

## 2016-01-10 ENCOUNTER — Encounter: Payer: Self-pay | Admitting: *Deleted

## 2016-01-10 NOTE — Patient Outreach (Signed)
Lori Oconnell Lori Oconnell) Care Oconnell  01/10/2016  Lori Oconnell 1961-02-02 [id number]   CSW received a new referral on patient from Lori Oconnell, Telephonic Nurse Case Manager with [place] [facility], indicating that patient would benefit from social work services and resources to assist patient with obtaining financial resources.  More specifically, patient is interested in finding out whether or not she is eligible to receive Adult Medicaid through the [place].  Patient would also like to know if she qualifies for any supplemental benefits to help offset her medical expenses.  Patient was unwilling to disclose financial information to Lori Oconnell, but agreed to confide in CSW for assistance. CSW was able to make initial contact with patient today to perform phone assessment, as well as assess and assist with social work needs and services.  CSW introduced self, explained role and types of services provided through Lori Oconnell (Lori Oconnell).  CSW further explained to patient that CSW works with patient's RNCM, also with Lori Oconnell, Lori Oconnell. CSW then explained the reason for the call, indicating that Lori Oconnell thought that patient would benefit from social work services and resources to assist with obtaining financial resources for patient.  CSW obtained two HIPAA compliant identifiers from patient, which included patient's name and date of birth. Patient admits that she is interested in applying for Adult Medicaid through the Lori Oconnell, but does not wish to disclose her husband's income. CSW explained to patient that even if she and her husband were not married, but living together, she would still have to include his income in the Oconnell.  CSW further explained to patient that this would be falsifying documents, which could land  patient into a great deal of trouble.  CSW agreed to mail patient an Adult Medicaid Oconnell for her review and completion, along with poverty guidelines for the [place] for fiscal year 2017/18, for a household income of two. In addition, CSW agreed to mail patient a Lori Oconnell through the Lori Oconnell. CSW will perform a case closure on patient, as all goals of treatment have been met from social work standpoint and no additional social work needs have been identified at this time.  CSW will notify patient's RNCM with [place] [facility], Lori Oconnell of CSW's plans to close patient's case.  CSW will fax an update to patient's Primary Care Physician, Dr.  Cathlean Oconnell to ensure that they are aware of CSW's involvement with patient's plan of care.  CSW will submit a case closure request to Lori Oconnell, Care Oconnell Assistant with Lori Oconnell, in the form of an [employer].  CSW will ensure that [name] is aware of Lori Oconnell's, RNCM with [place] [facility], continued involvement with patient's care.  Lori Oconnell, BSW, MSW, LCSW  Licensed Education officer, [facility]  Mailing [place] [address], [place], [place] [address] Physical Address-[address], [place], [place] [address] Toll Free Main # [phone] Fax # [phone] Cell # [phone]  Fax # [phone]  [email or web address]

## 2016-01-27 ENCOUNTER — Encounter: Payer: Self-pay | Admitting: Gastroenterology

## 2016-02-18 ENCOUNTER — Telehealth: Payer: Self-pay | Admitting: Emergency Medicine

## 2016-02-18 ENCOUNTER — Encounter: Payer: Self-pay | Admitting: Family

## 2016-02-18 ENCOUNTER — Ambulatory Visit (INDEPENDENT_AMBULATORY_CARE_PROVIDER_SITE_OTHER): Payer: Medicare Other | Admitting: Family

## 2016-02-18 VITALS — BP 148/90 | HR 87 | Temp 98.1°F | Resp 16 | Ht 65.0 in | Wt 179.1 lb

## 2016-02-18 DIAGNOSIS — D170 Benign lipomatous neoplasm of skin and subcutaneous tissue of head, face and neck: Secondary | ICD-10-CM

## 2016-02-18 DIAGNOSIS — M5030 Other cervical disc degeneration, unspecified cervical region: Secondary | ICD-10-CM | POA: Diagnosis not present

## 2016-02-18 MED ORDER — NAPROXEN 500 MG PO TABS: 500.0000 mg | ORAL_TABLET | Freq: Two times a day (BID) | ORAL | 0 refills | Status: DC

## 2016-02-18 MED ORDER — KETOROLAC TROMETHAMINE 60 MG/2ML IM SOLN
60.0000 mg | Freq: Once | INTRAMUSCULAR | Status: AC
  Administered 2016-02-18: 60 mg via INTRAMUSCULAR

## 2016-02-18 MED ORDER — PREDNISONE 10 MG (21) PO TBPK: ORAL_TABLET | ORAL | 0 refills | Status: DC

## 2016-02-18 NOTE — Assessment & Plan Note (Signed)
12 cm x 8 cm lipoma noted on the right side of C7. Currently working on obtaining surgery to have lipoma removed as she did in the past. General surgery has referred her to specialists. She also has underlying cervical degenerative disc issues which are most likely contributing to the pain and inflammation. Recommend follow-up with general surgeon. Start prednisone and naproxen. In office injection of Toradol provided for pain relief. Does have previous history of renal insufficiency, however most recent GFR was 131 with normal creatinine and B UN. Continue to monitor.

## 2016-02-18 NOTE — Patient Instructions (Signed)
Thank you for choosing [facility].  SUMMARY AND INSTRUCTIONS:  Medication:  Please start taking the ibuprofen as needed for discomfort.   Get [place] the name of the surgeon that you would like to consult with and I will send the referral.  Ice as needed.   Neck stretching.   Your prescription(s) have been submitted to your pharmacy or been printed and provided for you. Please take as directed and contact our office if you believe you are having problem(s) with the medication(s) or have any questions.   Follow up:  If your symptoms worsen or fail to improve, please contact our office for further instruction, or in case of emergency go directly to the emergency room at the closest medical facility.     Cervical Strain and Sprain With Rehab Cervical strain and sprain are injuries that commonly occur with "whiplash" injuries. Whiplash occurs when the neck is forcefully whipped backward or forward, such as during a motor vehicle accident or during contact sports. The muscles, ligaments, tendons, discs, and nerves of the neck are susceptible to injury when this occurs. RISK FACTORS Risk of having a whiplash injury increases if:  Osteoarthritis of the spine.  Situations that make head or neck accidents or trauma more likely.  High-risk sports (football, rugby, wrestling, hockey, auto racing, gymnastics, diving, contact karate, or boxing).  Poor strength and flexibility of the neck.  Previous neck injury.  Poor tackling technique.  Improperly fitted or padded equipment. SYMPTOMS   Pain or stiffness in the front or back of neck or both.  Symptoms may present immediately or up to 24 hours after injury.  Dizziness, headache, nausea, and vomiting.  Muscle spasm with soreness and stiffness in the neck.  Tenderness and swelling at the injury site. PREVENTION  Learn and use proper technique (avoid tackling with the head, spearing, and head-butting; use proper falling  techniques to avoid landing on the head).  Warm up and stretch properly before activity.  Maintain physical fitness:  Strength, flexibility, and endurance.  Cardiovascular fitness.  Wear properly fitted and padded protective equipment, such as padded soft collars, for participation in contact sports. PROGNOSIS  Recovery from cervical strain and sprain injuries is dependent on the extent of the injury. These injuries are usually curable in 1 week to 3 months with appropriate treatment.  RELATED COMPLICATIONS   Temporary numbness and weakness may occur if the nerve roots are damaged, and this may persist until the nerve has completely healed.  Chronic pain due to frequent recurrence of symptoms.  Prolonged healing, especially if activity is resumed too soon (before complete recovery). TREATMENT  Treatment initially involves the use of ice and medication to help reduce pain and inflammation. It is also important to perform strengthening and stretching exercises and modify activities that worsen symptoms so the injury does not get worse. These exercises may be performed at home or with a therapist. For patients who experience severe symptoms, a soft, padded collar may be recommended to be worn around the neck.  Improving your posture may help reduce symptoms. Posture improvement includes pulling your chin and abdomen in while sitting or standing. If you are sitting, sit in a firm chair with your buttocks against the back of the chair. While sleeping, try replacing your pillow with a small towel rolled to 2 inches in diameter, or use a cervical pillow or soft cervical collar. Poor sleeping positions delay healing.  For patients with nerve root damage, which causes numbness or weakness, the use  of a cervical traction apparatus may be recommended. Surgery is rarely necessary for these injuries. However, cervical strain and sprains that are present at birth (congenital) may require  surgery. MEDICATION   If pain medication is necessary, nonsteroidal anti-inflammatory medications, such as aspirin and ibuprofen, or other minor pain relievers, such as acetaminophen, are often recommended.  Do not take pain medication for 7 days before surgery.  Prescription pain relievers may be given if deemed necessary by your caregiver. Use only as directed and only as much as you need. HEAT AND COLD:   Cold treatment (icing) relieves pain and reduces inflammation. Cold treatment should be applied for 10 to 15 minutes every 2 to 3 hours for inflammation and pain and immediately after any activity that aggravates your symptoms. Use ice packs or an ice massage.  Heat treatment may be used prior to performing the stretching and strengthening activities prescribed by your caregiver, physical therapist, or athletic trainer. Use a heat pack or a warm soak. SEEK MEDICAL CARE IF:   Symptoms get worse or do not improve in 2 weeks despite treatment.  New, unexplained symptoms develop (drugs used in treatment may produce side effects). EXERCISES RANGE OF MOTION (ROM) AND STRETCHING EXERCISES - Cervical Strain and Sprain These exercises may help you when beginning to rehabilitate your injury. In order to successfully resolve your symptoms, you must improve your posture. These exercises are designed to help reduce the forward-head and rounded-shoulder posture which contributes to this condition. Your symptoms may resolve with or without further involvement from your physician, physical therapist or athletic trainer. While completing these exercises, remember:   Restoring tissue flexibility helps normal motion to return to the joints. This allows healthier, less painful movement and activity.  An effective stretch should be held for at least 20 seconds, although you may need to begin with shorter hold times for comfort.  A stretch should never be painful. You should only feel a gentle lengthening or  release in the stretched tissue. STRETCH- Axial Extensors  Lie on your back on the floor. You may bend your knees for comfort. Place a rolled-up hand towel or dish towel, about 2 inches in diameter, under the part of your head that makes contact with the floor.  Gently tuck your chin, as if trying to make a "double chin," until you feel a gentle stretch at the base of your head.  Hold __________ seconds. Repeat __________ times. Complete this exercise __________ times per day.  STRETCH - Axial Extension   Stand or sit on a firm surface. Assume a good posture: chest up, shoulders drawn back, abdominal muscles slightly tense, knees unlocked (if standing) and feet hip width apart.  Slowly retract your chin so your head slides back and your chin slightly lowers. Continue to look straight ahead.  You should feel a gentle stretch in the back of your head. Be certain not to feel an aggressive stretch since this can cause headaches later.  Hold for __________ seconds. Repeat __________ times. Complete this exercise __________ times per day. STRETCH - Cervical Side Bend   Stand or sit on a firm surface. Assume a good posture: chest up, shoulders drawn back, abdominal muscles slightly tense, knees unlocked (if standing) and feet hip width apart.  Without letting your nose or shoulders move, slowly tip your right / left ear to your shoulder until your feel a gentle stretch in the muscles on the opposite side of your neck.  Hold __________ seconds. Repeat __________ times.  Complete this exercise __________ times per day. STRETCH - Cervical Rotators   Stand or sit on a firm surface. Assume a good posture: chest up, shoulders drawn back, abdominal muscles slightly tense, knees unlocked (if standing) and feet hip width apart.  Keeping your eyes level with the ground, slowly turn your head until you feel a gentle stretch along the back and opposite side of your neck.  Hold __________  seconds. Repeat __________ times. Complete this exercise __________ times per day. RANGE OF MOTION - Neck Circles   Stand or sit on a firm surface. Assume a good posture: chest up, shoulders drawn back, abdominal muscles slightly tense, knees unlocked (if standing) and feet hip width apart.  Gently roll your head down and around from the back of one shoulder to the back of the other. The motion should never be forced or painful.  Repeat the motion 10-20 times, or until you feel the neck muscles relax and loosen. Repeat __________ times. Complete the exercise __________ times per day. STRENGTHENING EXERCISES - Cervical Strain and Sprain These exercises may help you when beginning to rehabilitate your injury. They may resolve your symptoms with or without further involvement from your physician, physical therapist, or athletic trainer. While completing these exercises, remember:   Muscles can gain both the endurance and the strength needed for everyday activities through controlled exercises.  Complete these exercises as instructed by your physician, physical therapist, or athletic trainer. Progress the resistance and repetitions only as guided.  You may experience muscle soreness or fatigue, but the pain or discomfort you are trying to eliminate should never worsen during these exercises. If this pain does worsen, stop and make certain you are following the directions exactly. If the pain is still present after adjustments, discontinue the exercise until you can discuss the trouble with your clinician. STRENGTH - Cervical Flexors, Isometric  Face a wall, standing about 6 inches away. Place a small pillow, a ball about 6-8 inches in diameter, or a folded towel between your forehead and the wall.  Slightly tuck your chin and gently push your forehead into the soft object. Push only with mild to moderate intensity, building up tension gradually. Keep your jaw and forehead relaxed.  Hold 10 to 20  seconds. Keep your breathing relaxed.  Release the tension slowly. Relax your neck muscles completely before you start the next repetition. Repeat __________ times. Complete this exercise __________ times per day. STRENGTH- Cervical Lateral Flexors, Isometric   Stand about 6 inches away from a wall. Place a small pillow, a ball about 6-8 inches in diameter, or a folded towel between the side of your head and the wall.  Slightly tuck your chin and gently tilt your head into the soft object. Push only with mild to moderate intensity, building up tension gradually. Keep your jaw and forehead relaxed.  Hold 10 to 20 seconds. Keep your breathing relaxed.  Release the tension slowly. Relax your neck muscles completely before you start the next repetition. Repeat __________ times. Complete this exercise __________ times per day. STRENGTH - Cervical Extensors, Isometric   Stand about 6 inches away from a wall. Place a small pillow, a ball about 6-8 inches in diameter, or a folded towel between the back of your head and the wall.  Slightly tuck your chin and gently tilt your head back into the soft object. Push only with mild to moderate intensity, building up tension gradually. Keep your jaw and forehead relaxed.  Hold 10 to 20  seconds. Keep your breathing relaxed.  Release the tension slowly. Relax your neck muscles completely before you start the next repetition. Repeat __________ times. Complete this exercise __________ times per day. POSTURE AND BODY MECHANICS CONSIDERATIONS - Cervical Strain and Sprain Keeping correct posture when sitting, standing or completing your activities will reduce the stress put on different body tissues, allowing injured tissues a chance to heal and limiting painful experiences. The following are general guidelines for improved posture. Your physician or physical therapist will provide you with any instructions specific to your needs. While reading these guidelines,  remember:  The exercises prescribed by your provider will help you have the flexibility and strength to maintain correct postures.  The correct posture provides the optimal environment for your joints to work. All of your joints have less wear and tear when properly supported by a spine with good posture. This means you will experience a healthier, less painful body.  Correct posture must be practiced with all of your activities, especially prolonged sitting and standing. Correct posture is as important when doing repetitive low-stress activities (typing) as it is when doing a single heavy-load activity (lifting). PROLONGED STANDING WHILE SLIGHTLY LEANING FORWARD When completing a task that requires you to lean forward while standing in one place for a long time, place either foot up on a stationary 2- to 4-inch high object to help maintain the best posture. When both feet are on the ground, the low back tends to lose its slight inward curve. If this curve flattens (or becomes too large), then the back and your other joints will experience too much stress, fatigue more quickly, and can cause pain.  RESTING POSITIONS Consider which positions are most painful for you when choosing a resting position. If you have pain with flexion-based activities (sitting, bending, stooping, squatting), choose a position that allows you to rest in a less flexed posture. You would want to avoid curling into a fetal position on your side. If your pain worsens with extension-based activities (prolonged standing, working overhead), avoid resting in an extended position such as sleeping on your stomach. Most people will find more comfort when they rest with their spine in a more neutral position, neither too rounded nor too arched. Lying on a non-sagging bed on your side with a pillow between your knees, or on your back with a pillow under your knees will often provide some relief. Keep in mind, being in any one position for a  prolonged period of time, no matter how correct your posture, can still lead to stiffness. WALKING Walk with an upright posture. Your ears, shoulders, and hips should all line up. OFFICE WORK When working at a desk, create an environment that supports good, upright posture. Without extra support, muscles fatigue and lead to excessive strain on joints and other tissues. CHAIR:  A chair should be able to slide under your desk when your back makes contact with the back of the chair. This allows you to work closely.  The chair's height should allow your eyes to be level with the upper part of your monitor and your hands to be slightly lower than your elbows.  Body position:  Your feet should make contact with the floor. If this is not possible, use a foot rest.  Keep your ears over your shoulders. This will reduce stress on your neck and low back.   This information is not intended to replace advice given to you by your health care provider. Make sure you discuss  any questions you have with your health care provider.   Document Released: 05/29/2005 Document Revised: 06/19/2014 Document Reviewed: 09/10/2008 Elsevier Interactive Patient Education [employer].

## 2016-02-18 NOTE — Telephone Encounter (Signed)
Pt is calling back about referral information. General and acute care surgery at [facility]. Phone number : [phone]. They do accept medicare. Thanks.

## 2016-02-18 NOTE — Progress Notes (Signed)
Subjective:    Patient ID: Lori Oconnell, female    DOB: 1961-04-10, 55 y.o.   MRN: [id number]  Chief Complaint  Patient presents with  . Back Pain    has been having alot of pain in upper and lower back, if lifting arms up too high it burns in arms, also burns in top part of legs, has lipoma     HPI:  Lori Oconnell is a 55 y.o. female who  has a past medical history of [place], CERVICAL (07/13/2009); Edema (03/22/2011); HEMATOCHEZIA (11/30/2009); HYPERLIPIDEMIA (07/18/2009); HYPERTENSION (07/12/2009); Irregular menses (03/22/2011); NECK PAIN (07/12/2009); OVERACTIVE BLADDER (07/12/2009); RENAL INSUFFICIENCY (07/12/2009); SINUSITIS- ACUTE-NOS (07/12/2009); Sleep apnea; and TRANSAMINASES, SERUM, ELEVATED (07/12/2009). and presents today for an office visit.  This is a new problem. Associated symptom of pain located in her upper and lower back has been going on for . There is radiculopathy with some burning located in the top part of her legs. She previous has had lipoma removal of the upper back and continues to have worsening of the previous lipomas. Pain is described primarily as sharp, dull and achy. Expresses difficulty lifting her hands above her head. The course has progressively worsened. She is working on obtaining surgery to have a resection done. Modifying factors include ice/, heat, ibuprofen which do not help. There is numbness and tingling in her hands with occasional weakness. Does have significant history of cervical spine issues with imaging that showed cervical spondylosis and degenerative disc disease causing prominent impingement at the C4-C5 and moderate impingment at C5-C6 and C6-C7 levels.     No Known Allergies    Outpatient Medications Prior to Visit  Medication Sig Dispense Refill  . amLODipine (NORVASC) 5 MG tablet TAKE 1 TABLET (5 MG TOTAL) BY MOUTH DAILY. 90 tablet 3  . aspirin 81 MG tablet Take 81 mg by mouth daily.      . solifenacin (VESICARE) 5 MG tablet Take 1 tablet  (5 mg total) by mouth daily. 90 tablet 3  . escitalopram (LEXAPRO) 10 MG tablet Take 1 tablet (10 mg total) by mouth daily. 90 tablet 3   No facility-administered medications prior to visit.       Past Surgical History:  Procedure Laterality Date  . MASS EXCISION N/A 08/27/2012   Procedure: EXCISION MASS UPPER BACK;  Surgeon: Joyice Faster. Cornett, MD;  Location: [place];  Service: General;  Laterality: N/A;  . s/p ganglion cyst    . TUBAL LIGATION        Past Medical History:  Diagnosis Date  . [place] DISEASE, CERVICAL 07/13/2009  . Edema 03/22/2011  . HEMATOCHEZIA 11/30/2009  . HYPERLIPIDEMIA 07/18/2009  . HYPERTENSION 07/12/2009  . Irregular menses 03/22/2011  . NECK PAIN 07/12/2009  . OVERACTIVE BLADDER 07/12/2009  . RENAL INSUFFICIENCY 07/12/2009  . SINUSITIS- ACUTE-NOS 07/12/2009  . Sleep apnea    she is waiting to be tested  . TRANSAMINASES, SERUM, ELEVATED 07/12/2009      Review of Systems  Constitutional: Negative for chills and fever.  Musculoskeletal: Positive for back pain, neck pain and neck stiffness.  Neurological: Positive for weakness and numbness.      Objective:    BP (!) 148/90 (BP Location: Left Arm, Patient Position: Sitting, Cuff Size: Large)   Pulse 87   Temp 98.1 F (36.7 C) (Oral)   Resp 16   Ht 5\' 5"  (1.651 m)   Wt 179 lb 1.9 oz (81.2 kg)   SpO2 98%   BMI 29.81 kg/m  Nursing note and vital signs reviewed.  Physical Exam  Constitutional: She is oriented to person, place, and time. She appears well-developed and well-nourished. No distress.  Neck:  There is a 12 x 8 cm cyst/tumor most likely lipoma located to the right lateral side of C7. There is also significant edema of both shoulders which are most likely contributing to her symptoms. Range of motion is limited secondary to edema. Distal pulses and sensation are intact and appropriate.  Cardiovascular: Normal rate, regular rhythm, normal heart sounds and intact distal pulses.   Pulmonary/Chest:  Effort normal and breath sounds normal.  Neurological: She is alert and oriented to person, place, and time.  Skin: Skin is warm and dry.  Psychiatric: She has a normal mood and affect. Her behavior is normal. Judgment and thought content normal.       Assessment & Plan:   Problem List Items Addressed This Visit      Musculoskeletal and Integument   DISC DISEASE, CERVICAL    Most likely a contributing factor trichomoniasis her current symptoms. Treat conservatively with ice and stretching. Start prednisone and naproxen. Work on lipoma removal through specialist as described. May require additional treatment pending lipoma removal.      Relevant Medications   predniSONE (STERAPRED UNI-PAK 21 TAB) 10 MG (21) TBPK tablet   naproxen (NAPROSYN) 500 MG tablet   ketorolac (TORADOL) injection 60 mg (Completed)   Lipoma of neck - Primary    12 cm x 8 cm lipoma noted on the right side of C7. Currently working on obtaining surgery to have lipoma removed as she did in the past. General surgery has referred her to specialists. She also has underlying cervical degenerative disc issues which are most likely contributing to the pain and inflammation. Recommend follow-up with general surgeon. Start prednisone and naproxen. In office injection of Toradol provided for pain relief. Does have previous history of renal insufficiency, however most recent GFR was 131 with normal creatinine and B UN. Continue to monitor.      Relevant Medications   predniSONE (STERAPRED UNI-PAK 21 TAB) 10 MG (21) TBPK tablet   naproxen (NAPROSYN) 500 MG tablet   ketorolac (TORADOL) injection 60 mg (Completed)    Other Visit Diagnoses   None.      I am having Ms. Carroll start on predniSONE and naproxen. I am also having her maintain her aspirin, escitalopram, amLODipine, and solifenacin. We administered ketorolac.   Meds ordered this encounter  Medications  . predniSONE (STERAPRED UNI-PAK 21 TAB) 10 MG (21) TBPK tablet     Sig: Take 6 tablets x 1 day, 5 tablets x 1 day, 4 tablets x 1 day, 3 tablets x 1 day, 2 tablets x 1 day, 1 tablet x 1 day    Dispense:  21 tablet    Refill:  0    Order Specific Question:   Supervising Provider    Answer:   Pricilla Holm A [id number]  . naproxen (NAPROSYN) 500 MG tablet    Sig: Take 1 tablet (500 mg total) by mouth 2 (two) times daily with a meal.    Dispense:  30 tablet    Refill:  0    Order Specific Question:   Supervising Provider    Answer:   Pricilla Holm A [id number]  . ketorolac (TORADOL) injection 60 mg     Follow-up: Return if symptoms worsen or fail to improve.  Mauricio Po, FNP

## 2016-02-18 NOTE — Assessment & Plan Note (Signed)
Most likely a contributing factor trichomoniasis her current symptoms. Treat conservatively with ice and stretching. Start prednisone and naproxen. Work on lipoma removal through specialist as described. May require additional treatment pending lipoma removal.

## 2016-02-25 NOTE — Telephone Encounter (Signed)
Was there supposed to be a referral in for this pt?

## 2016-02-28 NOTE — Telephone Encounter (Signed)
Referral placed.

## 2016-03-15 ENCOUNTER — Telehealth: Payer: Self-pay | Admitting: Internal Medicine

## 2016-03-15 DIAGNOSIS — D170 Benign lipomatous neoplasm of skin and subcutaneous tissue of head, face and neck: Secondary | ICD-10-CM

## 2016-03-15 NOTE — Telephone Encounter (Signed)
Pt seen Lori Oconnell a few weeks ago regarding a Lipoma of the neck and sent the referral to [facility]. They faxed back stating "refer pt to ENT" Please advise

## 2016-03-15 NOTE — Telephone Encounter (Signed)
Referral done

## 2016-03-24 ENCOUNTER — Ambulatory Visit (AMBULATORY_SURGERY_CENTER): Payer: Self-pay | Admitting: *Deleted

## 2016-03-24 VITALS — Ht 65.0 in | Wt 180.6 lb

## 2016-03-24 DIAGNOSIS — Z1211 Encounter for screening for malignant neoplasm of colon: Secondary | ICD-10-CM

## 2016-03-24 MED ORDER — NA SULFATE-K SULFATE-MG SULF 17.5-3.13-1.6 GM/177ML PO SOLN: 1.0000 | Freq: Once | ORAL | 0 refills | Status: AC

## 2016-03-24 NOTE — Progress Notes (Signed)
No allergies to eggs or soy. No problems with anesthesia.  Pt given Emmi instructions for colonoscopy  No oxygen use  No diet drug use  

## 2016-03-30 DIAGNOSIS — E881 Lipodystrophy, not elsewhere classified: Secondary | ICD-10-CM | POA: Diagnosis not present

## 2016-04-04 ENCOUNTER — Encounter: Payer: Medicare Other | Admitting: Gastroenterology

## 2016-04-19 ENCOUNTER — Other Ambulatory Visit: Payer: Self-pay | Admitting: Internal Medicine

## 2016-05-16 ENCOUNTER — Telehealth: Payer: Self-pay | Admitting: Gastroenterology

## 2016-05-16 NOTE — Telephone Encounter (Signed)
Please advise on No Show fee for procedure Dr Silverio Decamp

## 2016-05-16 NOTE — Telephone Encounter (Signed)
Ok to charge 

## 2016-05-17 ENCOUNTER — Encounter: Payer: Medicare Other | Admitting: Gastroenterology

## 2016-05-26 ENCOUNTER — Ambulatory Visit (INDEPENDENT_AMBULATORY_CARE_PROVIDER_SITE_OTHER): Payer: Medicare Other

## 2016-05-26 DIAGNOSIS — Z23 Encounter for immunization: Secondary | ICD-10-CM | POA: Diagnosis not present

## 2016-06-09 ENCOUNTER — Ambulatory Visit (AMBULATORY_SURGERY_CENTER): Payer: Self-pay | Admitting: *Deleted

## 2016-06-09 VITALS — Ht 66.0 in | Wt 179.8 lb

## 2016-06-09 DIAGNOSIS — Z1211 Encounter for screening for malignant neoplasm of colon: Secondary | ICD-10-CM

## 2016-06-09 MED ORDER — NA SULFATE-K SULFATE-MG SULF 17.5-3.13-1.6 GM/177ML PO SOLN
1.0000 | Freq: Once | ORAL | 0 refills | Status: AC
Start: 2016-06-09 — End: 2016-06-09

## 2016-06-09 NOTE — Progress Notes (Signed)
No allergies to eggs or soy. No problems with anesthesia.  Pt not given Emmi instructions for colonoscopy; has already watched  No oxygen use  No diet drug use

## 2016-06-26 ENCOUNTER — Telehealth: Payer: Self-pay | Admitting: Gastroenterology

## 2016-06-26 MED ORDER — NA SULFATE-K SULFATE-MG SULF 17.5-3.13-1.6 GM/177ML PO SOLN: 1.0000 | Freq: Once | ORAL | 0 refills | Status: AC

## 2016-06-26 NOTE — Telephone Encounter (Signed)
No charge. 

## 2016-06-26 NOTE — Telephone Encounter (Signed)
Left message on patient's voicemail that I could give her a sample if she could get here before 5:00.   I also sent it to her pharmacy in case she can't get here in time.

## 2016-06-27 ENCOUNTER — Telehealth: Payer: Self-pay | Admitting: Gastroenterology

## 2016-06-27 ENCOUNTER — Encounter: Payer: Medicare Other | Admitting: Gastroenterology

## 2016-06-27 NOTE — Telephone Encounter (Signed)
Called in coupon for patient it went through successfully she only has to pay $50 for her prep. Patient aware

## 2016-07-10 ENCOUNTER — Telehealth: Payer: Self-pay | Admitting: Gastroenterology

## 2016-07-10 ENCOUNTER — Telehealth: Payer: Self-pay

## 2016-07-10 NOTE — Telephone Encounter (Signed)
Spoke to patient, while going over her prep instructions, she was so concerned about getting the Suprep kit that she then realized that she had eaten a hamburger at noon today. Spoke to Dr. Silverio Decamp, she offered the patient the option of proceeding with colonoscopy for tomorrow however if she was not prepped completely she would have to repeat the exam. Patient decided to reschedule to another day. Rescheduled to 2/1 at 8:30.

## 2016-07-10 NOTE — Telephone Encounter (Signed)
Contacted patient to come pick up free Suprep sample kit.  She will be here today before 5. gm

## 2016-07-11 ENCOUNTER — Encounter: Payer: Medicare Other | Admitting: Gastroenterology

## 2016-07-12 ENCOUNTER — Telehealth: Payer: Self-pay | Admitting: Gastroenterology

## 2016-07-12 NOTE — Telephone Encounter (Signed)
No charge. 

## 2016-07-12 NOTE — Telephone Encounter (Signed)
Patient daughter states that pt husband has just passed away, so pt is canceling appt for tomorrow 07/13/16. Do you want to charge?

## 2016-07-13 ENCOUNTER — Encounter: Payer: Medicare Other | Admitting: Gastroenterology

## 2017-04-13 NOTE — Progress Notes (Deleted)
Subjective:   Lori Oconnell is a 56 y.o. female who presents for Medicare Annual (Subsequent) preventive examination.  Review of Systems:  No ROS.  Medicare Wellness Visit. Additional risk factors are reflected in the social history.    Sleep patterns: {SX; SLEEP PATTERNS:18802::"feels rested on waking","does not get up to void","gets up *** times nightly to void","sleeps *** hours nightly"}.    Home Safety/Smoke Alarms: Feels safe in home. Smoke alarms in place.  Living environment; residence and Firearm Safety: {Rehab home environment / accessibility:30080::"no firearms","firearms stored safely"}. Seat Belt Safety/Bike Helmet: Wears seat belt.    Objective:     Vitals: There were no vitals taken for this visit.  There is no height or weight on file to calculate BMI.   Tobacco History  Smoking Status  . Current Some Day Smoker  . Packs/day: 3.00  . Years: 5.00  . Types: Cigarettes  Smokeless Tobacco  . Former Systems developer    Comment: smokes 2 to 3 cigarettes a day     Ready to quit: Not Answered Counseling given: Not Answered   Past Medical History:  Diagnosis Date  . Anxiety   . [place] DISEASE, CERVICAL 07/13/2009  . Edema 03/22/2011  . HEMATOCHEZIA 11/30/2009  . HYPERLIPIDEMIA 07/18/2009  . HYPERTENSION 07/12/2009  . Irregular menses 03/22/2011  . NECK PAIN 07/12/2009  . OVERACTIVE BLADDER 07/12/2009  . RENAL INSUFFICIENCY 07/12/2009  . SINUSITIS- ACUTE-NOS 07/12/2009  . Sleep apnea    prescribed cpap but has not gotten  . TRANSAMINASES, SERUM, ELEVATED 07/12/2009   Past Surgical History:  Procedure Laterality Date  . MASS EXCISION N/A 08/27/2012   Procedure: EXCISION MASS UPPER BACK;  Surgeon: Joyice Faster. Cornett, MD;  Location: [facility];  Service: General;  Laterality: N/A;  . s/p ganglion cyst Right 1980  . TUBAL LIGATION  1990   Family History  Problem Relation Age of Onset  . Hypertension Mother   . Diabetes Maternal Aunt   . Colon cancer Neg Hx    History  Sexual  Activity  . Sexual activity: No    Outpatient Encounter Prescriptions as of 04/16/2017  Medication Sig  . amLODipine (NORVASC) 5 MG tablet TAKE 1 TABLET EVERY DAY  . aspirin 81 MG tablet Take 81 mg by mouth daily.    Marland Kitchen escitalopram (LEXAPRO) 10 MG tablet Take 1 tablet (10 mg total) by mouth daily.  . naproxen (NAPROSYN) 500 MG tablet Take 1 tablet (500 mg total) by mouth 2 (two) times daily with a meal.   No facility-administered encounter medications on file as of 04/16/2017.     Activities of Daily Living No flowsheet data found.  Patient Care Team: [name], MD as PCP - General    Assessment:    Physical assessment deferred to PCP.  Exercise Activities and Dietary recommendations   Diet (meal preparation, eat out, water intake, caffeinated beverages, dairy products, fruits and vegetables): {Desc; diets:16563}   Goals    . patient          Patient  Centered goal is to stay on top of her health to the best of her ability       Fall Risk Fall Risk  01/10/2016 11/04/2015 11/04/2015 07/02/2013  Falls in the past year? Yes No No No  Number falls in past yr: 2 or more - - -  Injury with Fall? Yes - - -  Risk Factor Category  High Fall Risk - - -  Risk for fall due to : Impaired  balance/gait;Impaired mobility;History of fall(s) - - -  Follow up Education provided;Falls prevention discussed - - -   Depression Screen PHQ 2/9 Scores 01/10/2016 11/04/2015 11/04/2015 07/02/2013  PHQ - 2 Score 1 0 0 1     Cognitive Function        Immunization History  Administered Date(s) Administered  . Influenza Split 03/22/2011  . Influenza Whole 07/12/2009  . Influenza,inj,Quad PF,6+ Mos 07/02/2013, 05/26/2016  . Td 07/12/2009   Screening Tests Health Maintenance  Topic Date Due  . PAP SMEAR  09/30/1983  . MAMMOGRAM  09/29/2012  . COLONOSCOPY  09/29/2012  . INFLUENZA VACCINE  01/10/2017  . TETANUS/TDAP  07/13/2019  . Hepatitis C Screening  Completed  . HIV Screening   Completed      Plan:      I have personally reviewed and noted the following in the patient's chart:   . Medical and social history . Use of alcohol, tobacco or illicit drugs  . Current medications and supplements . Functional ability and status . Nutritional status . Physical activity . Advanced directives . List of other physicians . Vitals . Screenings to include cognitive, depression, and falls . Referrals and appointments  In addition, I have reviewed and discussed with patient certain preventive protocols, quality metrics, and best practice recommendations. A written personalized care plan for preventive services as well as general preventive health recommendations were provided to patient.     Michiel Cowboy, RN  04/13/2017

## 2017-04-13 NOTE — Progress Notes (Deleted)
Pre visit review using our clinic review tool, if applicable. No additional management support is needed unless otherwise documented below in the visit note. 

## 2017-04-16 ENCOUNTER — Encounter: Payer: Medicare Other | Admitting: Internal Medicine

## 2017-05-09 ENCOUNTER — Encounter: Payer: Self-pay | Admitting: Internal Medicine

## 2017-05-09 ENCOUNTER — Other Ambulatory Visit (INDEPENDENT_AMBULATORY_CARE_PROVIDER_SITE_OTHER): Payer: Medicare Other

## 2017-05-09 ENCOUNTER — Ambulatory Visit (INDEPENDENT_AMBULATORY_CARE_PROVIDER_SITE_OTHER): Payer: Medicare Other | Admitting: Internal Medicine

## 2017-05-09 VITALS — BP 144/92 | HR 104 | Temp 98.0°F | Ht 66.0 in | Wt 175.0 lb

## 2017-05-09 DIAGNOSIS — Z0001 Encounter for general adult medical examination with abnormal findings: Secondary | ICD-10-CM | POA: Diagnosis not present

## 2017-05-09 DIAGNOSIS — Z1239 Encounter for other screening for malignant neoplasm of breast: Secondary | ICD-10-CM

## 2017-05-09 DIAGNOSIS — Z0000 Encounter for general adult medical examination without abnormal findings: Secondary | ICD-10-CM | POA: Diagnosis not present

## 2017-05-09 DIAGNOSIS — D539 Nutritional anemia, unspecified: Secondary | ICD-10-CM | POA: Diagnosis not present

## 2017-05-09 DIAGNOSIS — I10 Essential (primary) hypertension: Secondary | ICD-10-CM

## 2017-05-09 DIAGNOSIS — Z1231 Encounter for screening mammogram for malignant neoplasm of breast: Secondary | ICD-10-CM | POA: Diagnosis not present

## 2017-05-09 DIAGNOSIS — D170 Benign lipomatous neoplasm of skin and subcutaneous tissue of head, face and neck: Secondary | ICD-10-CM | POA: Diagnosis not present

## 2017-05-09 DIAGNOSIS — Z23 Encounter for immunization: Secondary | ICD-10-CM | POA: Diagnosis not present

## 2017-05-09 LAB — URINALYSIS, ROUTINE W REFLEX MICROSCOPIC
Bilirubin Urine: NEGATIVE
Hgb urine dipstick: NEGATIVE
KETONES UR: NEGATIVE
Leukocytes, UA: NEGATIVE
Nitrite: NEGATIVE
PH: 6 (ref 5.0–8.0)
RBC / HPF: NONE SEEN (ref 0–?)
Total Protein, Urine: NEGATIVE
URINE GLUCOSE: NEGATIVE
UROBILINOGEN UA: 0.2 (ref 0.0–1.0)
WBC UA: NONE SEEN (ref 0–?)

## 2017-05-09 LAB — BASIC METABOLIC PANEL
BUN: 5 mg/dL — ABNORMAL LOW (ref 6–23)
CHLORIDE: 103 meq/L (ref 96–112)
CO2: 30 meq/L (ref 19–32)
Calcium: 9.4 mg/dL (ref 8.4–10.5)
Creatinine, Ser: 0.58 mg/dL (ref 0.40–1.20)
GFR: 139.01 mL/min (ref >60.00)
GLUCOSE: 101 mg/dL — AB (ref 70–99)
POTASSIUM: 3.7 meq/L (ref 3.5–5.1)
SODIUM: 142 meq/L (ref 135–145)

## 2017-05-09 LAB — HEPATIC FUNCTION PANEL
ALT: 17 U/L (ref 0–35)
AST: 52 U/L — ABNORMAL HIGH (ref 0–37)
Albumin: 4.3 g/dL (ref 3.5–5.2)
Alkaline Phosphatase: 155 U/L — ABNORMAL HIGH (ref 39–117)
BILIRUBIN TOTAL: 1 mg/dL (ref 0.2–1.2)
Bilirubin, Direct: 0.1 mg/dL (ref 0.0–0.3)
Total Protein: 7.8 g/dL (ref 6.0–8.3)

## 2017-05-09 LAB — CBC WITH DIFFERENTIAL/PLATELET
BASOS PCT: 0.6 % (ref 0.0–3.0)
Basophils Absolute: 0 10*3/uL (ref 0.0–0.1)
EOS PCT: 4.7 % (ref 0.0–5.0)
Eosinophils Absolute: 0.3 10*3/uL (ref 0.0–0.7)
HCT: 39.1 % (ref 36.0–46.0)
Hemoglobin: 13.4 g/dL (ref 12.0–15.0)
LYMPHS ABS: 1.6 10*3/uL (ref 0.7–4.0)
Lymphocytes Relative: 23.4 % (ref 12.0–46.0)
MCHC: 34.2 g/dL (ref 30.0–36.0)
MCV: 119.6 fl — ABNORMAL HIGH (ref 78.0–100.0)
MONO ABS: 0.7 10*3/uL (ref 0.1–1.0)
Monocytes Relative: 9.2 % (ref 3.0–12.0)
NEUTROS PCT: 62.1 % (ref 43.0–77.0)
Neutro Abs: 4.4 10*3/uL (ref 1.4–7.7)
Platelets: 193 10*3/uL (ref 150.0–400.0)
RBC: 3.27 Mil/uL — ABNORMAL LOW (ref 3.87–5.11)
RDW: 14.1 % (ref 11.5–15.5)
WBC: 7 10*3/uL (ref 4.0–10.5)

## 2017-05-09 LAB — TSH: TSH: 0.75 u[IU]/mL (ref 0.35–4.50)

## 2017-05-09 LAB — LIPID PANEL
CHOL/HDL RATIO: 2
Cholesterol: 228 mg/dL — ABNORMAL HIGH (ref 0–200)
HDL: 93.3 mg/dL (ref >39.00)
LDL Cholesterol: 108 mg/dL — ABNORMAL HIGH (ref 0–99)
NonHDL: 134.9
TRIGLYCERIDES: 137 mg/dL (ref 0.0–149.0)
VLDL: 27.4 mg/dL (ref 0.0–40.0)

## 2017-05-09 LAB — VITAMIN B12: Vitamin B-12: 368 pg/mL (ref 211–911)

## 2017-05-09 MED ORDER — AMLODIPINE BESYLATE 5 MG PO TABS: 5.0000 mg | ORAL_TABLET | Freq: Every day | ORAL | 3 refills | Status: DC

## 2017-05-09 MED ORDER — ESCITALOPRAM OXALATE 10 MG PO TABS: 10.0000 mg | ORAL_TABLET | Freq: Every day | ORAL | 3 refills | Status: DC

## 2017-05-09 MED ORDER — NAPROXEN 500 MG PO TABS: 500.0000 mg | ORAL_TABLET | Freq: Two times a day (BID) | ORAL | 5 refills | Status: DC

## 2017-05-09 NOTE — Patient Instructions (Addendum)
You had the flu shot today  You will be contacted regarding the referral for: colonoscopy and mammogram, and General Surgury  Please continue all other medications as before, and refills have been done if requested.  Please have the pharmacy call with any other refills you may need.  Please continue your efforts at being more active, low cholesterol diet, and weight control.  You are otherwise up to date with prevention measures today.  Please keep your appointments with your specialists as you may have planned  Please go to the LAB in the Basement (turn left off the elevator) for the tests to be done today  You will be contacted by phone if any changes need to be made immediately.  Otherwise, you will receive a letter about your results with an explanation, but please check with MyChart first.  Please remember to sign up for MyChart if you have not done so, as this will be important to you in the future with finding out test results, communicating by private email, and scheduling acute appointments online when needed.  Please return in 1 year for your yearly visit, or sooner if needed, with Lab testing done 3-5 days before  Continue doing brain stimulating activities (puzzles, reading, adult coloring books, staying active) to keep memory sharp.   Continue to eat heart healthy diet (full of fruits, vegetables, whole grains, lean protein, water--limit salt, fat, and sugar intake) and increase physical activity as tolerated.   Lori Oconnell , Thank you for taking time to come for your Medicare Wellness Visit. I appreciate your ongoing commitment to your health goals. Please review the following plan we discussed and let me know if I can assist you in the future.   These are the goals we discussed: Goals    . Patient Stated     I want to be healthy as possible, I want to continue to eat healthy, watch fat intake, increase water intake, increase physical activity, enjoy life and family.        This is a list of the screening recommended for you and due dates:  Health Maintenance  Topic Date Due  . Pap Smear  09/30/1983  . Mammogram  09/29/2012  . Colon Cancer Screening  09/29/2012  . Flu Shot  01/10/2017  . Tetanus Vaccine  07/13/2019  .  Hepatitis C: One time screening is recommended by Center for Disease Control  (CDC) for  adults born from [birth year] through 1963.   Completed  . HIV Screening  Completed

## 2017-05-09 NOTE — Progress Notes (Signed)
Subjective:    Patient ID: Lori Oconnell, female    DOB: 1961-03-14, 56 y.o.   MRN: [id number]  HPI  Here for wellness and f/u;  Overall doing ok;  Pt denies Chest pain, worsening SOB, DOE, wheezing, orthopnea, PND, worsening LE edema, palpitations, dizziness or syncope.  Pt denies neurological change such as new headache, facial or extremity weakness.  Pt denies polydipsia, polyuria, or low sugar symptoms. Pt states overall good compliance with treatment and medications, good tolerability, and has been trying to follow appropriate diet.  Pt denies worsening depressive symptoms, suicidal ideation or panic. No fever, night sweats, wt loss, loss of appetite, or other constitutional symptoms.  Pt states good ability with ADL's, has low fall risk, home safety reviewed and adequate, no other significant changes in hearing or vision, and only occasionally active with exercise. No new complaints or interval hx, except large lipomas seem even larger now, asks for referral gen surgury. Wt Readings from Last 3 Encounters:  05/09/17 175 lb (79.4 kg)  06/09/16 179 lb 12.8 oz (81.6 kg)  03/24/16 180 lb 9.6 oz (81.9 kg)   BP Readings from Last 3 Encounters:  05/09/17 (!) 144/92  02/18/16 (!) 148/90  11/04/15 (!) 148/84  BP at home < 140/90 reported pt pt, declines any med change tx.  Past Medical History:  Diagnosis Date  . Anxiety   . [place] DISEASE, CERVICAL 07/13/2009  . Edema 03/22/2011  . HEMATOCHEZIA 11/30/2009  . HYPERLIPIDEMIA 07/18/2009  . HYPERTENSION 07/12/2009  . Irregular menses 03/22/2011  . NECK PAIN 07/12/2009  . OVERACTIVE BLADDER 07/12/2009  . RENAL INSUFFICIENCY 07/12/2009  . SINUSITIS- ACUTE-NOS 07/12/2009  . Sleep apnea    prescribed cpap but has not gotten  . TRANSAMINASES, SERUM, ELEVATED 07/12/2009   Past Surgical History:  Procedure Laterality Date  . MASS EXCISION N/A 08/27/2012   Procedure: EXCISION MASS UPPER BACK;  Surgeon: Joyice Faster. Cornett, MD;  Location: [facility];  Service:  General;  Laterality: N/A;  . s/p ganglion cyst Right 1980  . TUBAL LIGATION  1990    reports that she has been smoking cigarettes.  She has a 15.00 pack-year smoking history. She has quit using smokeless tobacco. She reports that she drinks alcohol. She reports that she does not use drugs. family history includes Diabetes in her maternal aunt; Hypertension in her mother. No Known Allergies Current Outpatient Medications on File Prior to Visit  Medication Sig Dispense Refill  . aspirin 81 MG tablet Take 81 mg by mouth daily.       No current facility-administered medications on file prior to visit.    Review of Systems Constitutional: Negative for other unusual diaphoresis, sweats, appetite or weight changes HENT: Negative for other worsening hearing loss, ear pain, facial swelling, mouth sores or neck stiffness.   Eyes: Negative for other worsening pain, redness or other visual disturbance.  Respiratory: Negative for other stridor or swelling Cardiovascular: Negative for other palpitations or other chest pain  Gastrointestinal: Negative for worsening diarrhea or loose stools, blood in stool, distention or other pain Genitourinary: Negative for hematuria, flank pain or other change in urine volume.  Musculoskeletal: Negative for myalgias or other joint swelling.  Skin: Negative for other color change, or other wound or worsening drainage.  Neurological: Negative for other syncope or numbness. Hematological: Negative for other adenopathy or swelling Psychiatric/Behavioral: Negative for hallucinations, other worsening agitation, SI, self-injury, or new decreased concentration\All other system neg per pt    Objective:   Physical  Exam BP (!) 144/92   Pulse (!) 104   Temp 98 F (36.7 C) (Oral)   Ht 5\' 6"  (1.676 m)   Wt 175 lb (79.4 kg)   SpO2 99%   BMI 28.25 kg/m  VS noted,  Constitutional: Pt is oriented to person, place, and time. Appears well-developed and well-nourished, in no  significant distress and comfortable Head: Normocephalic and atraumatic  Eyes: Conjunctivae and EOM are normal. Pupils are equal, round, and reactive to light Right Ear: External ear normal without discharge Left Ear: External ear normal without discharge Nose: Nose without discharge or deformity Mouth/Throat: Oropharynx is without other ulcerations and moist  Neck: Normal range of motion. Neck supple. No JVD present. No tracheal deviation present or significant neck LA or mass Cardiovascular: Normal rate, regular rhythm, normal heart sounds and intact distal pulses.   Pulmonary/Chest: WOB normal and breath sounds without rales or wheezing  Abdominal: Soft. Bowel sounds are normal. NT. No HSM  Musculoskeletal: Normal range of motion. Exhibits no edema Lymphadenopathy: Has no other cervical adenopathy.  Neurological: Pt is alert and oriented to person, place, and time. Pt has normal reflexes. No cranial nerve deficit. Motor grossly intact, Gait intact Skin: Skin is warm and dry. 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 Behavior is normal without agitation No other exam findings    Assessment & Plan:

## 2017-05-09 NOTE — Progress Notes (Signed)
Subjective:   Lori Oconnell is a 56 y.o. female who presents for Medicare Annual (Subsequent) preventive examination.  Review of Systems:  No ROS.  Medicare Wellness Visit. Additional risk factors are reflected in the social history.  Cardiac Risk Factors include: advanced age (>28men, >93 women);dyslipidemia;hypertension  Sleep patterns: has restless sleep, gets up 2-3 times nightly to void and sleeps 4-5 hours nightly.  Patient reports insomnia issues, discussed recommended sleep tips and stress reduction tips.   Home Safety/Smoke Alarms: Feels safe in home. Smoke alarms in place.  Living environment; residence and Firearm Safety: 1-story house/ trailer, no firearms. Lives with husband, no needs for DME, good support system Seat Belt Safety/Bike Helmet: Wears seat belt.     Objective:     Vitals: BP (!) 144/92   Pulse (!) 104   Temp 98 F (36.7 C) (Oral)   Ht 5\' 6"  (1.676 m)   Wt 175 lb (79.4 kg)   SpO2 99%   BMI 28.25 kg/m   Body mass index is 28.25 kg/m.   Tobacco Social History   Tobacco Use  Smoking Status Current Some Day Smoker  . Packs/day: 3.00  . Years: 5.00  . Pack years: 15.00  . Types: Cigarettes  Smokeless Tobacco Former Systems developer  Tobacco Comment   smokes 2 to 3 cigarettes a day     Ready to quit: Not Answered Counseling given: Not Answered Comment: smokes 2 to 3 cigarettes a day   Past Medical History:  Diagnosis Date  . Anxiety   . [place] DISEASE, CERVICAL 07/13/2009  . Edema 03/22/2011  . HEMATOCHEZIA 11/30/2009  . HYPERLIPIDEMIA 07/18/2009  . HYPERTENSION 07/12/2009  . Irregular menses 03/22/2011  . NECK PAIN 07/12/2009  . OVERACTIVE BLADDER 07/12/2009  . RENAL INSUFFICIENCY 07/12/2009  . SINUSITIS- ACUTE-NOS 07/12/2009  . Sleep apnea    prescribed cpap but has not gotten  . TRANSAMINASES, SERUM, ELEVATED 07/12/2009   Past Surgical History:  Procedure Laterality Date  . MASS EXCISION N/A 08/27/2012   Procedure: EXCISION MASS UPPER BACK;   Surgeon: Joyice Faster. Cornett, MD;  Location: [facility];  Service: General;  Laterality: N/A;  . s/p ganglion cyst Right 1980  . TUBAL LIGATION  1990   Family History  Problem Relation Age of Onset  . Hypertension Mother   . Diabetes Maternal Aunt   . Colon cancer Neg Hx    Social History   Substance and Sexual Activity  Sexual Activity No  . Birth control/protection: None    Outpatient Encounter Medications as of 05/09/2017  Medication Sig  . amLODipine (NORVASC) 5 MG tablet Take 1 tablet (5 mg total) by mouth daily.  Marland Kitchen aspirin 81 MG tablet Take 81 mg by mouth daily.    . naproxen (NAPROSYN) 500 MG tablet Take 1 tablet (500 mg total) by mouth 2 (two) times daily with a meal.  . [DISCONTINUED] amLODipine (NORVASC) 5 MG tablet TAKE 1 TABLET EVERY DAY  . [DISCONTINUED] naproxen (NAPROSYN) 500 MG tablet Take 1 tablet (500 mg total) by mouth 2 (two) times daily with a meal.  . escitalopram (LEXAPRO) 10 MG tablet Take 1 tablet (10 mg total) by mouth daily.  . [DISCONTINUED] escitalopram (LEXAPRO) 10 MG tablet Take 1 tablet (10 mg total) by mouth daily.   No facility-administered encounter medications on file as of 05/09/2017.     Activities of Daily Living In your present state of health, do you have any difficulty performing the following activities: 05/09/2017  Hearing? N  Vision?  N  Difficulty concentrating or making decisions? N  Walking or climbing stairs? N  Dressing or bathing? N  Doing errands, shopping? N  Preparing Food and eating ? N  Using the Toilet? N  In the past six months, have you accidently leaked urine? N  Do you have problems with loss of bowel control? N  Managing your Medications? N  Managing your Finances? N  Housekeeping or managing your Housekeeping? N  Some recent data might be hidden    Patient Care Team: [name], MD as PCP - General    Assessment:    Physical assessment deferred to PCP.  Exercise Activities and Dietary  recommendations Current Exercise Habits: The patient does not participate in regular exercise at present Diet (meal preparation, eat out, water intake, caffeinated beverages, dairy products, fruits and vegetables): in general, a "healthy" diet  , well balanced    Reviewed heart healthy diet, encouraged patient to increase daily water intake.  Goals    . Patient Stated     I want to be healthy as possible, I want to continue to eat healthy, watch fat intake, increase water intake, increase physical activity, enjoy life and family.      Fall Risk Fall Risk  05/09/2017 01/10/2016 11/04/2015 11/04/2015 07/02/2013  Falls in the past year? Yes Yes No No No  Number falls in past yr: 1 2 or more - - -  Injury with Fall? No Yes - - -  Risk Factor Category  - High Fall Risk - - -  Risk for fall due to : - Impaired balance/gait;Impaired mobility;History of fall(s) - - -  Follow up - Education provided;Falls prevention discussed - - -   Depression Screen PHQ 2/9 Scores 05/09/2017 01/10/2016 11/04/2015 11/04/2015  PHQ - 2 Score 2 1 0 0  PHQ- 9 Score 7 - - -     Cognitive Function MMSE - Mini Mental State Exam 05/09/2017  Orientation to time 5  Orientation to Place 5       Ad8 score reviewed for issues:  Issues making decisions: no  Less interest in hobbies / activities: no  Repeats questions, stories (family complaining): no  Trouble using ordinary gadgets (microwave, computer, phone):no  Forgets the month or year: no  Mismanaging finances: no  Remembering appts: no  Daily problems with thinking and/or memory: no Ad8 score is= 0  Immunization History  Administered Date(s) Administered  . Influenza Split 03/22/2011  . Influenza Whole 07/12/2009  . Influenza,inj,Quad PF,6+ Mos 07/02/2013, 05/26/2016, 05/09/2017  . Td 07/12/2009   Screening Tests Health Maintenance  Topic Date Due  . PAP SMEAR  09/30/1983  . MAMMOGRAM  09/29/2012  . COLONOSCOPY  09/29/2012  . INFLUENZA  VACCINE  01/10/2017  . TETANUS/TDAP  07/13/2019  . Hepatitis C Screening  Completed  . HIV Screening  Completed      Plan:    Patient will call her GYN to make an appointment to have a PAP smear screening.   Continue doing brain stimulating activities (puzzles, reading, adult coloring books, staying active) to keep memory sharp.   Continue to eat heart healthy diet (full of fruits, vegetables, whole grains, lean protein, water--limit salt, fat, and sugar intake) and increase physical activity as tolerated.  I have personally reviewed and noted the following in the patient's chart:   . Medical and social history . Use of alcohol, tobacco or illicit drugs  . Current medications and supplements . Functional ability and status . Nutritional  status . Physical activity . Advanced directives . List of other physicians . Vitals . Screenings to include cognitive, depression, and falls . Referrals and appointments  In addition, I have reviewed and discussed with patient certain preventive protocols, quality metrics, and best practice recommendations. A written personalized care plan for preventive services as well as general preventive health recommendations were provided to patient.     Michiel Cowboy, RN  05/09/2017  Medical screening examination/treatment/procedure(s) were performed by non-physician practitioner and as supervising physician I was immediately available for consultation/collaboration. I agree with above. Cathlean Cower, MD

## 2017-05-10 ENCOUNTER — Telehealth: Payer: Self-pay | Admitting: *Deleted

## 2017-05-10 NOTE — Telephone Encounter (Signed)
Called and spoke to patient to inform her that Dr. Jenny Reichmann would like for her to have a colonoscopy preferably over doing the Cologuard screening due to the patient has not had any prior colonoscopies and this would give her the best first screening evaluation.  It was explained that if the patient has the same problem of not being able to afford the prep prior to the procedure to call and let PCP know at that time. Patient is aware that she will be contacted to schedule colonoscopy and verbalized understanding.

## 2017-05-11 ENCOUNTER — Telehealth: Payer: Self-pay | Admitting: Internal Medicine

## 2017-05-11 ENCOUNTER — Encounter: Payer: Self-pay | Admitting: Gastroenterology

## 2017-05-11 NOTE — Telephone Encounter (Signed)
Route to CMA °

## 2017-05-11 NOTE — Telephone Encounter (Signed)
Pt informed of results 05/09/2017 per PCP message in labs tabs.

## 2017-05-11 NOTE — Telephone Encounter (Signed)
Copied from [place]. Topic: Quick Communication - See Telephone Encounter >> May 11, 2017  4:08 PM Vernona Rieger wrote: CRM for notification. See Telephone encounter for:   Pt is calling requesting her lab results, she said her mychart is acting up & can not get into it. Please call her @ [phone] 05/11/17.

## 2017-05-11 NOTE — Telephone Encounter (Signed)
LVM giving patient test results, read MD's message off of labs.

## 2017-05-13 NOTE — Assessment & Plan Note (Signed)
Worsening right neck in the past yr as well as probably upper back and shoulders, for general surgury referral ([facility])

## 2017-05-13 NOTE — Assessment & Plan Note (Signed)
I think may be mild uncontrolled but pt declines and tx change; o/w stable overall by history and exam, recent data reviewed with pt,  to f/u any worsening symptoms or concerns

## 2017-05-13 NOTE — Assessment & Plan Note (Signed)

## 2017-06-11 ENCOUNTER — Ambulatory Visit: Payer: Medicare Other

## 2017-06-13 ENCOUNTER — Ambulatory Visit
Admission: RE | Admit: 2017-06-13 | Discharge: 2017-06-13 | Disposition: A | Discharge status: Home / Self Care | Payer: Medicare Other | Source: Ambulatory Visit | Attending: Internal Medicine | Admitting: Internal Medicine

## 2017-06-13 DIAGNOSIS — Z1239 Encounter for other screening for malignant neoplasm of breast: Secondary | ICD-10-CM

## 2017-06-13 DIAGNOSIS — Z1231 Encounter for screening mammogram for malignant neoplasm of breast: Secondary | ICD-10-CM | POA: Diagnosis not present

## 2017-06-27 ENCOUNTER — Ambulatory Visit (AMBULATORY_SURGERY_CENTER): Payer: Self-pay | Admitting: *Deleted

## 2017-06-27 ENCOUNTER — Other Ambulatory Visit: Payer: Self-pay

## 2017-06-27 VITALS — Ht 64.0 in | Wt 172.0 lb

## 2017-06-27 DIAGNOSIS — Z1211 Encounter for screening for malignant neoplasm of colon: Secondary | ICD-10-CM

## 2017-06-27 NOTE — Progress Notes (Signed)
Patient denies any allergies to eggs or soy. Patient denies any problems with anesthesia/sedation. Patient denies any oxygen use at home. Patient denies taking any diet/weight loss medications or blood thinners. EMMI education DECLINED BY PT. Patient has Suprep sample at home given by [place] during last PV. Pt will call us back if Suprep expired.

## 2017-06-28 ENCOUNTER — Encounter: Payer: Self-pay | Admitting: Gastroenterology

## 2017-07-05 ENCOUNTER — Other Ambulatory Visit: Payer: Self-pay | Admitting: Internal Medicine

## 2017-07-05 DIAGNOSIS — D179 Benign lipomatous neoplasm, unspecified: Secondary | ICD-10-CM

## 2017-07-11 ENCOUNTER — Telehealth: Payer: Self-pay | Admitting: Gastroenterology

## 2017-07-11 ENCOUNTER — Encounter: Payer: Medicare Other | Admitting: Gastroenterology

## 2017-07-11 NOTE — Telephone Encounter (Signed)
ok 

## 2017-07-16 ENCOUNTER — Encounter: Payer: Medicare Other | Admitting: Gastroenterology

## 2017-07-16 ENCOUNTER — Telehealth: Payer: Self-pay | Admitting: Gastroenterology

## 2017-07-16 NOTE — Telephone Encounter (Signed)
Patient has already cancelled/ rescheduled colonoscopy multiple times , believe cancelled it 5 times so far. Please do not reschedule as patient doesn't seem to be willing to do the procedure.

## 2017-12-31 DIAGNOSIS — L308 Other specified dermatitis: Secondary | ICD-10-CM | POA: Diagnosis not present

## 2017-12-31 DIAGNOSIS — E882 Lipomatosis, not elsewhere classified: Secondary | ICD-10-CM | POA: Diagnosis not present

## 2018-04-30 LAB — COLOGUARD: Cologuard: NEGATIVE

## 2018-04-30 LAB — FECAL OCCULT BLOOD, IMMUNOCHEMICAL: IFOBT: NEGATIVE

## 2018-05-13 ENCOUNTER — Other Ambulatory Visit: Payer: Self-pay | Admitting: Internal Medicine

## 2018-05-14 NOTE — Progress Notes (Deleted)
Subjective:   Lori Oconnell is a 57 y.o. female who presents for Medicare Annual (Subsequent) preventive examination.  Review of Systems:  No ROS.  Medicare Wellness Visit. Additional risk factors are reflected in the social history.    Sleep patterns: {SX; SLEEP PATTERNS:18802::"feels rested on waking","does not get up to void","gets up *** times nightly to void","sleeps *** hours nightly"}.    Home Safety/Smoke Alarms: Feels safe in home. Smoke alarms in place.  Living environment; residence and Firearm Safety: {Rehab home environment / accessibility:30080::"no firearms","firearms stored safely"}. Seat Belt Safety/Bike Helmet: Wears seat belt.      Objective:     Vitals: LMP 03/19/2011   There is no height or weight on file to calculate BMI.  Advanced Directives 05/09/2017 06/09/2016 03/24/2016 01/10/2016 11/04/2015 08/21/2012  Does Patient Have a Medical Advance Directive? No No No No No Patient would not like information  Would patient like information on creating a medical advance directive? No - Patient declined - - No - patient declined information No - patient declined information -    Tobacco Social History   Tobacco Use  Smoking Status Current Some Day Smoker  . Packs/day: 0.25  . Years: 5.00  . Pack years: 1.25  . Types: Cigarettes  Smokeless Tobacco Former Systems developer  . Types: Chew  Tobacco Comment   smokes 2 to 3 cigarettes a day     Ready to quit: Not Answered Counseling given: Not Answered Comment: smokes 2 to 3 cigarettes a day  Past Medical History:  Diagnosis Date  . Anxiety   . [place] DISEASE, CERVICAL 07/13/2009  . Edema 03/22/2011  . HEMATOCHEZIA 11/30/2009  . HYPERLIPIDEMIA 07/18/2009  . HYPERTENSION 07/12/2009  . Irregular menses 03/22/2011  . NECK PAIN 07/12/2009  . OVERACTIVE BLADDER 07/12/2009  . RENAL INSUFFICIENCY 07/12/2009  . SINUSITIS- ACUTE-NOS 07/12/2009  . Sleep apnea    prescribed cpap but has not gotten  . TRANSAMINASES, SERUM, ELEVATED  07/12/2009   Past Surgical History:  Procedure Laterality Date  . MASS EXCISION N/A 08/27/2012   Procedure: EXCISION MASS UPPER BACK;  Surgeon: Joyice Faster. Cornett, MD;  Location: [facility];  Service: General;  Laterality: N/A;  . s/p ganglion cyst Right 1980  . TUBAL LIGATION  1990   Family History  Problem Relation Age of Onset  . Hypertension Mother   . Diabetes Maternal Aunt   . Colon cancer Neg Hx    Social History   Socioeconomic History  . Marital status: Single    Spouse name: Not on file  . Number of children: 2  . Years of education: Not on file  . Highest education level: Not on file  Occupational History  . Occupation: CNA  Social Needs  . Financial resource strain: Not on file  . Food insecurity:    Worry: Not on file    Inability: Not on file  . Transportation needs:    Medical: Not on file    Non-medical: Not on file  Tobacco Use  . Smoking status: Current Some Day Smoker    Packs/day: 0.25    Years: 5.00    Pack years: 1.25    Types: Cigarettes  . Smokeless tobacco: Former Systems developer    Types: Chew  . Tobacco comment: smokes 2 to 3 cigarettes a day  Substance and Sexual Activity  . Alcohol use: Yes    Alcohol/week: 3.0 standard drinks    Types: 3 Standard drinks or equivalent per week    Comment: Tiye Huwe, beer weekly   .  Drug use: No  . Sexual activity: Never    Birth control/protection: None  Lifestyle  . Physical activity:    Days per week: Not on file    Minutes per session: Not on file  . Stress: Not on file  Relationships  . Social connections:    Talks on phone: Not on file    Gets together: Not on file    Attends religious service: Not on file    Active member of club or organization: Not on file    Attends meetings of clubs or organizations: Not on file    Relationship status: Not on file  Other Topics Concern  . Not on file  Social History Narrative  . Not on file    Outpatient Encounter Medications as of 05/15/2018  Medication Sig  .  amLODipine (NORVASC) 5 MG tablet TAKE 1 TABLET BY MOUTH EVERY DAY  . aspirin 81 MG tablet Take 81 mg by mouth daily.    Marland Kitchen escitalopram (LEXAPRO) 10 MG tablet TAKE 1 TABLET BY MOUTH EVERY DAY  . naproxen (NAPROSYN) 500 MG tablet Take 1 tablet (500 mg total) by mouth 2 (two) times daily with a meal.   No facility-administered encounter medications on file as of 05/15/2018.     Activities of Daily Living No flowsheet data found.  Patient Care Team: [name], MD as PCP - General    Assessment:   This is a routine wellness examination for Lori Oconnell. Physical assessment deferred to PCP.   Exercise Activities and Dietary recommendations   Diet (meal preparation, eat out, water intake, caffeinated beverages, dairy products, fruits and vegetables): {Desc; diets:16563}   Goals    . patient     Patient  Centered goal is to stay on top of her health to the best of her ability     . Patient Stated     I want to be healthy as possible, I want to continue to eat healthy, watch fat intake, increase water intake, increase physical activity, enjoy life and family.       Fall Risk Fall Risk  05/09/2017 01/10/2016 11/04/2015 11/04/2015 07/02/2013  Falls in the past year? Yes Yes No No No  Number falls in past yr: 1 2 or more - - -  Injury with Fall? No Yes - - -  Risk Factor Category  - High Fall Risk - - -  Risk for fall due to : - Impaired balance/gait;Impaired mobility;History of fall(s) - - -  Follow up - Education provided;Falls prevention discussed - - -    Depression Screen PHQ 2/9 Scores 05/09/2017 01/10/2016 11/04/2015 11/04/2015  PHQ - 2 Score 2 1 0 0  PHQ- 9 Score 7 - - -     Cognitive Function MMSE - Mini Mental State Exam 05/09/2017  Orientation to time 5  Orientation to Place 5        Immunization History  Administered Date(s) Administered  . Influenza Split 03/22/2011  . Influenza Whole 07/12/2009  . Influenza,inj,Quad PF,6+ Mos 07/02/2013, 05/26/2016, 05/09/2017  .  Td 07/12/2009   Screening Tests Health Maintenance  Topic Date Due  . PAP SMEAR  09/30/1983  . COLONOSCOPY  09/29/2012  . INFLUENZA VACCINE  01/10/2018  . MAMMOGRAM  06/14/2019  . TETANUS/TDAP  07/13/2019  . Hepatitis C Screening  Completed  . HIV Screening  Completed      Plan:     I have personally reviewed and noted the following in the patient's chart:   .  Medical and social history . Use of alcohol, tobacco or illicit drugs  . Current medications and supplements . Functional ability and status . Nutritional status . Physical activity . Advanced directives . List of other physicians . Vitals . Screenings to include cognitive, depression, and falls . Referrals and appointments  In addition, I have reviewed and discussed with patient certain preventive protocols, quality metrics, and best practice recommendations. A written personalized care plan for preventive services as well as general preventive health recommendations were provided to patient.     Michiel Cowboy, RN  05/14/2018

## 2018-05-15 ENCOUNTER — Ambulatory Visit: Payer: Medicare Other

## 2018-05-15 ENCOUNTER — Other Ambulatory Visit: Payer: Self-pay | Admitting: Internal Medicine

## 2018-05-15 ENCOUNTER — Encounter: Payer: Self-pay | Admitting: Internal Medicine

## 2018-05-15 ENCOUNTER — Ambulatory Visit (INDEPENDENT_AMBULATORY_CARE_PROVIDER_SITE_OTHER): Payer: Medicare Other | Admitting: Internal Medicine

## 2018-05-15 ENCOUNTER — Other Ambulatory Visit (INDEPENDENT_AMBULATORY_CARE_PROVIDER_SITE_OTHER): Payer: Medicare Other

## 2018-05-15 VITALS — BP 142/98 | HR 95 | Temp 98.9°F | Ht 64.0 in | Wt 177.0 lb

## 2018-05-15 DIAGNOSIS — R945 Abnormal results of liver function studies: Secondary | ICD-10-CM

## 2018-05-15 DIAGNOSIS — K089 Disorder of teeth and supporting structures, unspecified: Secondary | ICD-10-CM

## 2018-05-15 DIAGNOSIS — Z0000 Encounter for general adult medical examination without abnormal findings: Secondary | ICD-10-CM | POA: Diagnosis not present

## 2018-05-15 DIAGNOSIS — N318 Other neuromuscular dysfunction of bladder: Secondary | ICD-10-CM

## 2018-05-15 DIAGNOSIS — Z23 Encounter for immunization: Secondary | ICD-10-CM

## 2018-05-15 DIAGNOSIS — R7989 Other specified abnormal findings of blood chemistry: Secondary | ICD-10-CM

## 2018-05-15 DIAGNOSIS — D7589 Other specified diseases of blood and blood-forming organs: Secondary | ICD-10-CM

## 2018-05-15 LAB — BASIC METABOLIC PANEL
BUN: 5 mg/dL — ABNORMAL LOW (ref 6–23)
CO2: 28 mEq/L (ref 19–32)
CREATININE: 0.55 mg/dL (ref 0.40–1.20)
Calcium: 8.8 mg/dL (ref 8.4–10.5)
Chloride: 102 mEq/L (ref 96–112)
GFR: 147.24 mL/min (ref >60.00)
Glucose, Bld: 84 mg/dL (ref 70–99)
Potassium: 3.6 mEq/L (ref 3.5–5.1)
Sodium: 142 mEq/L (ref 135–145)

## 2018-05-15 LAB — CBC WITH DIFFERENTIAL/PLATELET
Basophils Absolute: 0 10*3/uL (ref 0.0–0.1)
Basophils Relative: 0.9 % (ref 0.0–3.0)
EOS ABS: 0.2 10*3/uL (ref 0.0–0.7)
Eosinophils Relative: 4.4 % (ref 0.0–5.0)
HCT: 38.1 % (ref 36.0–46.0)
Hemoglobin: 13.3 g/dL (ref 12.0–15.0)
Lymphocytes Relative: 24.2 % (ref 12.0–46.0)
Lymphs Abs: 1.2 10*3/uL (ref 0.7–4.0)
MCHC: 34.8 g/dL (ref 30.0–36.0)
MCV: 116.4 fl — ABNORMAL HIGH (ref 78.0–100.0)
Monocytes Absolute: 0.6 10*3/uL (ref 0.1–1.0)
Monocytes Relative: 12.7 % — ABNORMAL HIGH (ref 3.0–12.0)
Neutro Abs: 2.8 10*3/uL (ref 1.4–7.7)
Neutrophils Relative %: 57.8 % (ref 43.0–77.0)
Platelets: 161 10*3/uL (ref 150.0–400.0)
RBC: 3.28 Mil/uL — AB (ref 3.87–5.11)
RDW: 15 % (ref 11.5–15.5)
WBC: 4.8 10*3/uL (ref 4.0–10.5)

## 2018-05-15 LAB — LIPID PANEL
Cholesterol: 173 mg/dL (ref 0–200)
HDL: 89.8 mg/dL (ref >39.00)
LDL CALC: 69 mg/dL (ref 0–99)
NONHDL: 83.62
Total CHOL/HDL Ratio: 2
Triglycerides: 72 mg/dL (ref 0.0–149.0)
VLDL: 14.4 mg/dL (ref 0.0–40.0)

## 2018-05-15 LAB — HEPATIC FUNCTION PANEL
ALK PHOS: 127 U/L — AB (ref 39–117)
ALT: 39 U/L — ABNORMAL HIGH (ref 0–35)
AST: 119 U/L — ABNORMAL HIGH (ref 0–37)
Albumin: 3.9 g/dL (ref 3.5–5.2)
BILIRUBIN DIRECT: 0.3 mg/dL (ref 0.0–0.3)
Total Bilirubin: 1 mg/dL (ref 0.2–1.2)
Total Protein: 7.3 g/dL (ref 6.0–8.3)

## 2018-05-15 LAB — URINALYSIS, ROUTINE W REFLEX MICROSCOPIC
Bilirubin Urine: NEGATIVE
HGB URINE DIPSTICK: NEGATIVE
Ketones, ur: NEGATIVE
Leukocytes, UA: NEGATIVE
Nitrite: NEGATIVE
RBC / HPF: NONE SEEN (ref 0–?)
Specific Gravity, Urine: <=1.005 — AB (ref 1.000–1.030)
Total Protein, Urine: NEGATIVE
Urine Glucose: NEGATIVE
Urobilinogen, UA: 0.2 (ref 0.0–1.0)
WBC, UA: NONE SEEN (ref 0–?)
pH: 6.5 (ref 5.0–8.0)

## 2018-05-15 LAB — TSH: TSH: 0.94 u[IU]/mL (ref 0.35–4.50)

## 2018-05-15 MED ORDER — ESCITALOPRAM OXALATE 10 MG PO TABS: 10.0000 mg | ORAL_TABLET | Freq: Every day | ORAL | 3 refills | Status: DC

## 2018-05-15 MED ORDER — AMLODIPINE BESYLATE 5 MG PO TABS: 5.0000 mg | ORAL_TABLET | Freq: Every day | ORAL | 3 refills | Status: DC

## 2018-05-15 MED ORDER — SOLIFENACIN SUCCINATE 5 MG PO TABS: 5.0000 mg | ORAL_TABLET | Freq: Every day | ORAL | 3 refills | Status: DC

## 2018-05-15 NOTE — Assessment & Plan Note (Signed)
To f/u dental as planned

## 2018-05-15 NOTE — Assessment & Plan Note (Signed)

## 2018-05-15 NOTE — Progress Notes (Signed)
Subjective:    Patient ID: Lori Oconnell, female    DOB: [birth year], 57 y.o.   MRN: [id number]  HPI  Here for wellness and f/u;  Overall doing ok;  Pt denies Chest pain, worsening SOB, DOE, wheezing, orthopnea, PND, worsening LE edema, palpitations, dizziness or syncope.  Pt denies neurological change such as new headache, facial or extremity weakness.  Pt denies polydipsia, polyuria, or low sugar symptoms. Pt states overall good compliance with treatment and medications, good tolerability, and has been trying to follow appropriate diet.  Pt denies worsening depressive symptoms, suicidal ideation or panic. No fever, night sweats, wt loss, loss of appetite, or other constitutional symptoms.  Pt states good ability with ADL's, has low fall risk, home safety reviewed and adequate, no other significant changes in hearing or vision, and only occasionally active with exercise.  Cologuard neg Nov 2019 Has ongoing nocturia 4-5 times per night since Sunday, has hx of OAB, Denies urinary symptoms such as dysuria, urgency, flank pain, hematuria or n/v, fever, chills.  Also with poor dentition, plans to see dental soon with new insurance Past Medical History:  Diagnosis Date  . Anxiety   . [place] DISEASE, CERVICAL 07/13/2009  . Edema 03/22/2011  . HEMATOCHEZIA 11/30/2009  . HYPERLIPIDEMIA 07/18/2009  . HYPERTENSION 07/12/2009  . Irregular menses 03/22/2011  . NECK PAIN 07/12/2009  . OVERACTIVE BLADDER 07/12/2009  . RENAL INSUFFICIENCY 07/12/2009  . SINUSITIS- ACUTE-NOS 07/12/2009  . Sleep apnea    prescribed cpap but has not gotten  . TRANSAMINASES, SERUM, ELEVATED 07/12/2009   Past Surgical History:  Procedure Laterality Date  . MASS EXCISION N/A 08/27/2012   Procedure: EXCISION MASS UPPER BACK;  Surgeon: Joyice Faster. Cornett, MD;  Location: [facility];  Service: General;  Laterality: N/A;  . s/p ganglion cyst Right 1980  . TUBAL LIGATION  1990    reports that she has been smoking cigarettes. She has a 1.25 pack-year  smoking history. She has quit using smokeless tobacco.  Her smokeless tobacco use included chew. She reports that she drinks about 3.0 standard drinks of alcohol per week. She reports that she does not use drugs. family history includes Diabetes in her maternal aunt; Hypertension in her mother. No Known Allergies Current Outpatient Medications on File Prior to Visit  Medication Sig Dispense Refill  . aspirin 81 MG tablet Take 81 mg by mouth daily.      . naproxen (NAPROSYN) 500 MG tablet Take 1 tablet (500 mg total) by mouth 2 (two) times daily with a meal. 60 tablet 5   No current facility-administered medications on file prior to visit.    Review of Systems Constitutional: Negative for other unusual diaphoresis, sweats, appetite or weight changes HENT: Negative for other worsening hearing loss, ear pain, facial swelling, mouth sores or neck stiffness.   Eyes: Negative for other worsening pain, redness or other visual disturbance.  Respiratory: Negative for other stridor or swelling Cardiovascular: Negative for other palpitations or other chest pain  Gastrointestinal: Negative for worsening diarrhea or loose stools, blood in stool, distention or other pain Genitourinary: Negative for hematuria, flank pain or other change in urine volume.  Musculoskeletal: Negative for myalgias or other joint swelling.  Skin: Negative for other color change, or other wound or worsening drainage.  Neurological: Negative for other syncope or numbness. Hematological: Negative for other adenopathy or swelling Psychiatric/Behavioral: Negative for hallucinations, other worsening agitation, SI, self-injury, or new decreased concentration All other system neg per pt    Objective:  Physical Exam BP (!) 142/98   Pulse 95   Temp 98.9 F (37.2 C) (Oral)   Ht 5\' 4"  (1.626 m)   Wt 177 lb (80.3 kg)   LMP 03/19/2011   SpO2 96%   BMI 30.38 kg/m  VS noted,  Constitutional: Pt is oriented to person, place, and  time. Appears well-developed and well-nourished, in no significant distress and comfortable Head: Normocephalic and atraumatic  Eyes: Conjunctivae and EOM are normal. Pupils are equal, round, and reactive to light Right Ear: External ear normal without discharge Left Ear: External ear normal without discharge Nose: Nose without discharge or deformity Mouth/Throat: Oropharynx is without other ulcerations and moist  Neck: Normal range of motion. Neck supple. No JVD present. No tracheal deviation present or significant neck LA or mass Cardiovascular: Normal rate, regular rhythm, normal heart sounds and intact distal pulses.   Pulmonary/Chest: WOB normal and breath sounds without rales or wheezing  Abdominal: Soft. Bowel sounds are normal. NT. No HSM  Musculoskeletal: Normal range of motion. Exhibits no edema Lymphadenopathy: Has no other cervical adenopathy.  Neurological: Pt is alert and oriented to person, place, and time. Pt has normal reflexes. No cranial nerve deficit. Motor grossly intact, Gait intact Skin: Skin is warm and dry. No rash noted or new ulcerations Psychiatric:  Has normal mood and affect. Behavior is normal without agitation No other exam findings Lab Results  Component Value Date   WBC 4.8 05/15/2018   HGB 13.3 05/15/2018   HCT 38.1 05/15/2018   PLT 161.0 05/15/2018   GLUCOSE 84 05/15/2018   CHOL 173 05/15/2018   TRIG 72.0 05/15/2018   HDL 89.80 05/15/2018   LDLDIRECT 119.0 11/04/2015   LDLCALC 69 05/15/2018   ALT 39 (H) 05/15/2018   AST 119 (H) 05/15/2018   NA 142 05/15/2018   K 3.6 05/15/2018   CL 102 05/15/2018   CREATININE 0.55 05/15/2018   BUN 5 (L) 05/15/2018   CO2 28 05/15/2018   TSH 0.94 05/15/2018       Assessment & Plan:

## 2018-05-15 NOTE — Patient Instructions (Addendum)
Please take all new medication as prescribed - the vesicare 5 mg  You had the flu shot today  Please continue all other medications as before, and refills have been done if requested.  Please have the pharmacy call with any other refills you may need.  Please continue your efforts at being more active, low cholesterol diet, and weight control.  You are otherwise up to date with prevention measures today.  Please keep your appointments with your specialists as you may have planned  Please go to the LAB in the Basement (turn left off the elevator) for the tests to be done today  You will be contacted by phone if any changes need to be made immediately.  Otherwise, you will receive a letter about your results with an explanation, but please check with MyChart first.  Please remember to sign up for MyChart if you have not done so, as this will be important to you in the future with finding out test results, communicating by private email, and scheduling acute appointments online when needed.  Please return in 1 year for your yearly visit, or sooner if needed, with Lab testing done 3-5 days before

## 2018-05-15 NOTE — Assessment & Plan Note (Signed)
For ua with labs, also toviaz asd,  to f/u any worsening symptoms or concerns

## 2018-05-16 ENCOUNTER — Telehealth: Payer: Self-pay

## 2018-05-16 NOTE — Telephone Encounter (Signed)
-----   Message from [name], MD sent at 05/15/2018  5:14 PM EST ----- Left message on MyChart, pt to cont same tx except  The test results show that your current treatment is OK, except the liver tests are elevated somewhat more, and the MCV is getting higher.  You have been found to have fatty liver in 2011, but with these findings, we need to refer you to Gastroenterology to see if further evaluation is needed.    Herschell Virani to please inform pt, I will do referral

## 2018-05-16 NOTE — Telephone Encounter (Signed)
Pt has been informed of results and expressed understanding.  °

## 2018-05-23 ENCOUNTER — Ambulatory Visit: Payer: Medicare Other

## 2018-05-23 NOTE — Progress Notes (Deleted)
Subjective:   Lori Oconnell is a 57 y.o. female who presents for Medicare Annual (Subsequent) preventive examination.  Review of Systems:  No ROS.  Medicare Wellness Visit. Additional risk factors are reflected in the social history.    Sleep patterns: {SX; SLEEP PATTERNS:18802::"feels rested on waking","does not get up to void","gets up *** times nightly to void","sleeps *** hours nightly"}.    Home Safety/Smoke Alarms: Feels safe in home. Smoke alarms in place.  Living environment; residence and Firearm Safety: {Rehab home environment / accessibility:30080::"no firearms","firearms stored safely"}. Seat Belt Safety/Bike Helmet: Wears seat belt.     Objective:     Vitals: LMP 03/19/2011   There is no height or weight on file to calculate BMI.  Advanced Directives 05/09/2017 06/09/2016 03/24/2016 01/10/2016 11/04/2015 08/21/2012  Does Patient Have a Medical Advance Directive? No No No No No Patient would not like information  Would patient like information on creating a medical advance directive? No - Patient declined - - No - patient declined information No - patient declined information -    Tobacco Social History   Tobacco Use  Smoking Status Current Some Day Smoker  . Packs/day: 0.25  . Years: 5.00  . Pack years: 1.25  . Types: Cigarettes  Smokeless Tobacco Former Systems developer  . Types: Chew  Tobacco Comment   smokes 2 to 3 cigarettes a day     Ready to quit: Not Answered Counseling given: Not Answered Comment: smokes 2 to 3 cigarettes a day  Past Medical History:  Diagnosis Date  . Anxiety   . [place] DISEASE, CERVICAL 07/13/2009  . Edema 03/22/2011  . HEMATOCHEZIA 11/30/2009  . HYPERLIPIDEMIA 07/18/2009  . HYPERTENSION 07/12/2009  . Irregular menses 03/22/2011  . NECK PAIN 07/12/2009  . OVERACTIVE BLADDER 07/12/2009  . RENAL INSUFFICIENCY 07/12/2009  . SINUSITIS- ACUTE-NOS 07/12/2009  . Sleep apnea    prescribed cpap but has not gotten  . TRANSAMINASES, SERUM, ELEVATED  07/12/2009   Past Surgical History:  Procedure Laterality Date  . MASS EXCISION N/A 08/27/2012   Procedure: EXCISION MASS UPPER BACK;  Surgeon: Joyice Faster. Cornett, MD;  Location: [facility];  Service: General;  Laterality: N/A;  . s/p ganglion cyst Right 1980  . TUBAL LIGATION  1990   Family History  Problem Relation Age of Onset  . Hypertension Mother   . Diabetes Maternal Aunt   . Colon cancer Neg Hx    Social History   Socioeconomic History  . Marital status: Single    Spouse name: Not on file  . Number of children: 2  . Years of education: Not on file  . Highest education level: Not on file  Occupational History  . Occupation: CNA  Social Needs  . Financial resource strain: Not on file  . Food insecurity:    Worry: Not on file    Inability: Not on file  . Transportation needs:    Medical: Not on file    Non-medical: Not on file  Tobacco Use  . Smoking status: Current Some Day Smoker    Packs/day: 0.25    Years: 5.00    Pack years: 1.25    Types: Cigarettes  . Smokeless tobacco: Former Systems developer    Types: Chew  . Tobacco comment: smokes 2 to 3 cigarettes a day  Substance and Sexual Activity  . Alcohol use: Yes    Alcohol/week: 3.0 standard drinks    Types: 3 Standard drinks or equivalent per week    Comment: Delmar Arriaga, beer weekly   .  Drug use: No  . Sexual activity: Never    Birth control/protection: None  Lifestyle  . Physical activity:    Days per week: Not on file    Minutes per session: Not on file  . Stress: Not on file  Relationships  . Social connections:    Talks on phone: Not on file    Gets together: Not on file    Attends religious service: Not on file    Active member of club or organization: Not on file    Attends meetings of clubs or organizations: Not on file    Relationship status: Not on file  Other Topics Concern  . Not on file  Social History Narrative  . Not on file    Outpatient Encounter Medications as of 05/23/2018  Medication Sig  .  amLODipine (NORVASC) 5 MG tablet Take 1 tablet (5 mg total) by mouth daily.  Marland Kitchen aspirin 81 MG tablet Take 81 mg by mouth daily.    Marland Kitchen escitalopram (LEXAPRO) 10 MG tablet Take 1 tablet (10 mg total) by mouth daily.  . naproxen (NAPROSYN) 500 MG tablet Take 1 tablet (500 mg total) by mouth 2 (two) times daily with a meal.  . solifenacin (VESICARE) 5 MG tablet Take 1 tablet (5 mg total) by mouth daily.   No facility-administered encounter medications on file as of 05/23/2018.     Activities of Daily Living No flowsheet data found.  Patient Care Team: [name], MD as PCP - General    Assessment:   This is a routine wellness examination for Lori Oconnell. Physical assessment deferred to PCP.   Exercise Activities and Dietary recommendations   Diet (meal preparation, eat out, water intake, caffeinated beverages, dairy products, fruits and vegetables): {Desc; diets:16563}   Goals    . patient     Patient  Centered goal is to stay on top of her health to the best of her ability     . Patient Stated     I want to be healthy as possible, I want to continue to eat healthy, watch fat intake, increase water intake, increase physical activity, enjoy life and family.       Fall Risk Fall Risk  05/15/2018 05/09/2017 01/10/2016 11/04/2015 11/04/2015  Falls in the past year? 0 Yes Yes No No  Number falls in past yr: - 1 2 or more - -  Injury with Fall? - No Yes - -  Risk Factor Category  - - High Fall Risk - -  Risk for fall due to : - - Impaired balance/gait;Impaired mobility;History of fall(s) - -  Follow up - - Education provided;Falls prevention discussed - -   Depression Screen PHQ 2/9 Scores 05/15/2018 05/09/2017 01/10/2016 11/04/2015  PHQ - 2 Score 1 2 1  0  PHQ- 9 Score - 7 - -     Cognitive Function MMSE - Mini Mental State Exam 05/09/2017  Orientation to time 5  Orientation to Place 5        Immunization History  Administered Date(s) Administered  . Influenza Split 03/22/2011  .  Influenza Whole 07/12/2009  . Influenza,inj,Quad PF,6+ Mos 07/02/2013, 05/26/2016, 05/09/2017, 05/15/2018  . Td 07/12/2009   Screening Tests Health Maintenance  Topic Date Due  . COLONOSCOPY  05/16/2019 (Originally 09/29/2012)  . MAMMOGRAM  06/14/2019  . TETANUS/TDAP  07/13/2019  . PAP SMEAR-Modifier  06/08/2020  . INFLUENZA VACCINE  Completed  . Hepatitis C Screening  Completed  . HIV Screening  Completed  Plan:      I have personally reviewed and noted the following in the patient's chart:   . Medical and social history . Use of alcohol, tobacco or illicit drugs  . Current medications and supplements . Functional ability and status . Nutritional status . Physical activity . Advanced directives . List of other physicians . Vitals . Screenings to include cognitive, depression, and falls . Referrals and appointments  In addition, I have reviewed and discussed with patient certain preventive protocols, quality metrics, and best practice recommendations. A written personalized care plan for preventive services as well as general preventive health recommendations were provided to patient.     Michiel Cowboy, RN  05/23/2018

## 2018-05-24 ENCOUNTER — Telehealth: Payer: Self-pay | Admitting: Internal Medicine

## 2018-05-24 NOTE — Telephone Encounter (Signed)
Pt has been informed that no new labs have been done and more than likely the response she see was from the lab work she and I discussed back on 12/4. She expressed understanding.

## 2018-05-24 NOTE — Telephone Encounter (Signed)
Copied from [facility] [id number]. Topic: Quick Communication - See Telephone Encounter >> May 24, 2018 11:59 AM Vernona Rieger wrote: CRM for notification. See Telephone encounter for: 05/24/18. Pt said she got an alert that she had some " new " results in my chart but she is unable to see them. She would like the nurse to call her back

## 2018-05-28 ENCOUNTER — Ambulatory Visit (INDEPENDENT_AMBULATORY_CARE_PROVIDER_SITE_OTHER): Payer: Medicare Other | Admitting: *Deleted

## 2018-05-28 VITALS — BP 162/94 | HR 94 | Resp 18 | Ht 64.0 in | Wt 176.0 lb

## 2018-05-28 DIAGNOSIS — Z0000 Encounter for general adult medical examination without abnormal findings: Secondary | ICD-10-CM

## 2018-05-28 NOTE — Patient Instructions (Signed)
Continue doing brain stimulating activities (puzzles, reading, adult coloring books, staying active) to keep memory sharp.   Continue to eat heart healthy diet (full of fruits, vegetables, whole grains, lean protein, water--limit salt, fat, and sugar intake) and increase physical activity as tolerated.   Lori Oconnell , Thank you for taking time to come for your Medicare Wellness Visit. I appreciate your ongoing commitment to your health goals. Please review the following plan we discussed and let me know if I can assist you in the future.   These are the goals we discussed: Goals    . patient     Patient  Centered goal is to stay on top of her health to the best of her ability     . Patient Stated     I want to be healthy as possible, I want to continue to eat healthy, watch fat intake, increase water intake, increase physical activity, enjoy life and family.    . Patient Stated     Stay positive, talk to God and prepare for the upcoming surgery.       This is a list of the screening recommended for you and due dates:  Health Maintenance  Topic Date Due  . Colon Cancer Screening  05/16/2019*  . Mammogram  06/14/2019  . Tetanus Vaccine  07/13/2019  . Pap Smear  06/08/2020  . Flu Shot  Completed  .  Hepatitis C: One time screening is recommended by Center for Disease Control  (CDC) for  adults born from [birth year] through 1963.   Completed  . HIV Screening  Completed  *Topic was postponed. The date shown is not the original due date.   Health Maintenance, Female Adopting a healthy lifestyle and getting preventive care can go a long way to promote health and wellness. Talk with your health care provider about what schedule of regular examinations is right for you. This is a good chance for you to check in with your provider about disease prevention and staying healthy. In between checkups, there are plenty of things you can do on your own. Experts have done a lot of research about which  lifestyle changes and preventive measures are most likely to keep you healthy. Ask your health care provider for more information. Weight and diet Eat a healthy diet  Be sure to include plenty of vegetables, fruits, low-fat dairy products, and lean protein.  Do not eat a lot of foods high in solid fats, added sugars, or salt.  Get regular exercise. This is one of the most important things you can do for your health. ? Most adults should exercise for at least 150 minutes each week. The exercise should increase your heart rate and make you sweat (moderate-intensity exercise). ? Most adults should also do strengthening exercises at least twice a week. This is in addition to the moderate-intensity exercise.  Maintain a healthy weight  Body mass index (BMI) is a measurement that can be used to identify possible weight problems. It estimates body fat based on height and weight. Your health care provider can help determine your BMI and help you achieve or maintain a healthy weight.  For females 81 years of age and older: ? A BMI below 18.5 is considered underweight. ? A BMI of 18.5 to 24.9 is normal. ? A BMI of 25 to 29.9 is considered overweight. ? A BMI of 30 and above is considered obese.  Watch levels of cholesterol and blood lipids  You should start having your  blood tested for lipids and cholesterol at 55 years of age, then have this test every 5 years.  You may need to have your cholesterol levels checked more often if: ? Your lipid or cholesterol levels are high. ? You are older than 55 years of age. ? You are at high risk for heart disease.  Cancer screening Lung Cancer  Lung cancer screening is recommended for adults 52-54 years old who are at high risk for lung cancer because of a history of smoking.  A yearly low-dose CT scan of the lungs is recommended for people who: ? Currently smoke. ? Have quit within the past 15 years. ? Have at least a 30-pack-year history of  smoking. A pack year is smoking an average of one pack of cigarettes a day for 1 year.  Yearly screening should continue until it has been 15 years since you quit.  Yearly screening should stop if you develop a health problem that would prevent you from having lung cancer treatment.  Breast Cancer  Practice breast self-awareness. This means understanding how your breasts normally appear and feel.  It also means doing regular breast self-exams. Let your health care provider know about any changes, no matter how small.  If you are in your 20s or 30s, you should have a clinical breast exam (CBE) by a health care provider every 1-3 years as part of a regular health exam.  If you are 77 or older, have a CBE every year. Also consider having a breast X-ray (mammogram) every year.  If you have a family history of breast cancer, talk to your health care provider about genetic screening.  If you are at high risk for breast cancer, talk to your health care provider about having an MRI and a mammogram every year.  Breast cancer gene (BRCA) assessment is recommended for women who have family members with BRCA-related cancers. BRCA-related cancers include: ? Breast. ? Ovarian. ? Tubal. ? Peritoneal cancers.  Results of the assessment will determine the need for genetic counseling and BRCA1 and BRCA2 testing.  Cervical Cancer Your health care provider may recommend that you be screened regularly for cancer of the pelvic organs (ovaries, uterus, and vagina). This screening involves a pelvic examination, including checking for microscopic changes to the surface of your cervix (Pap test). You may be encouraged to have this screening done every 3 years, beginning at age 8.  For women ages 52-65, health care providers may recommend pelvic exams and Pap testing every 3 years, or they may recommend the Pap and pelvic exam, combined with testing for human papilloma virus (HPV), every 5 years. Some types of  HPV increase your risk of cervical cancer. Testing for HPV may also be done on women of any age with unclear Pap test results.  Other health care providers may not recommend any screening for nonpregnant women who are considered low risk for pelvic cancer and who do not have symptoms. Ask your health care provider if a screening pelvic exam is right for you.  If you have had past treatment for cervical cancer or a condition that could lead to cancer, you need Pap tests and screening for cancer for at least 20 years after your treatment. If Pap tests have been discontinued, your risk factors (such as having a new sexual partner) need to be reassessed to determine if screening should resume. Some women have medical problems that increase the chance of getting cervical cancer. In these cases, your health care provider  may recommend more frequent screening and Pap tests.  Colorectal Cancer  This type of cancer can be detected and often prevented.  Routine colorectal cancer screening usually begins at 55 years of age and continues through 55 years of age.  Your health care provider may recommend screening at an earlier age if you have risk factors for colon cancer.  Your health care provider may also recommend using home test kits to check for hidden blood in the stool.  A small camera at the end of a tube can be used to examine your colon directly (sigmoidoscopy or colonoscopy). This is done to check for the earliest forms of colorectal cancer.  Routine screening usually begins at age 10.  Direct examination of the colon should be repeated every 5-10 years through 55 years of age. However, you may need to be screened more often if early forms of precancerous polyps or small growths are found.  Skin Cancer  Check your skin from head to toe regularly.  Tell your health care provider about any new moles or changes in moles, especially if there is a change in a mole's shape or color.  Also tell  your health care provider if you have a mole that is larger than the size of a pencil eraser.  Always use sunscreen. Apply sunscreen liberally and repeatedly throughout the day.  Protect yourself by wearing long sleeves, pants, a wide-brimmed hat, and sunglasses whenever you are outside.  Heart disease, diabetes, and high blood pressure  High blood pressure causes heart disease and increases the risk of stroke. High blood pressure is more likely to develop in: ? People who have blood pressure in the high end of the normal range (130-139/85-89 mm Hg). ? People who are overweight or obese. ? People who are African American.  If you are 29-86 years of age, have your blood pressure checked every 3-5 years. If you are 26 years of age or older, have your blood pressure checked every year. You should have your blood pressure measured twice-once when you are at a hospital or clinic, and once when you are not at a hospital or clinic. Record the average of the two measurements. To check your blood pressure when you are not at a hospital or clinic, you can use: ? An automated blood pressure machine at a pharmacy. ? A home blood pressure monitor.  If you are between 80 years and 62 years old, ask your health care provider if you should take aspirin to prevent strokes.  Have regular diabetes screenings. This involves taking a blood sample to check your fasting blood sugar level. ? If you are at a normal weight and have a low risk for diabetes, have this test once every three years after 55 years of age. ? If you are overweight and have a high risk for diabetes, consider being tested at a younger age or more often. Preventing infection Hepatitis B  If you have a higher risk for hepatitis B, you should be screened for this virus. You are considered at high risk for hepatitis B if: ? You were born in a country where hepatitis B is common. Ask your health care provider which countries are considered high  risk. ? Your parents were born in a high-risk country, and you have not been immunized against hepatitis B (hepatitis B vaccine). ? You have HIV or AIDS. ? You use needles to inject street drugs. ? You live with someone who has hepatitis B. ? You have  had sex with someone who has hepatitis B. ? You get hemodialysis treatment. ? You take certain medicines for conditions, including cancer, organ transplantation, and autoimmune conditions.  Hepatitis C  Blood testing is recommended for: ? Everyone born from [birth year] through 1963. ? Anyone with known risk factors for hepatitis C.  Sexually transmitted infections (STIs)  You should be screened for sexually transmitted infections (STIs) including gonorrhea and chlamydia if: ? You are sexually active and are younger than 55 years of age. ? You are older than 55 years of age and your health care provider tells you that you are at risk for this type of infection. ? Your sexual activity has changed since you were last screened and you are at an increased risk for chlamydia or gonorrhea. Ask your health care provider if you are at risk.  If you do not have HIV, but are at risk, it may be recommended that you take a prescription medicine daily to prevent HIV infection. This is called pre-exposure prophylaxis (PrEP). You are considered at risk if: ? You are sexually active and do not regularly use condoms or know the HIV status of your partner(s). ? You take drugs by injection. ? You are sexually active with a partner who has HIV.  Talk with your health care provider about whether you are at high risk of being infected with HIV. If you choose to begin PrEP, you should first be tested for HIV. You should then be tested every 3 months for as long as you are taking PrEP. Pregnancy  If you are premenopausal and you may become pregnant, ask your health care provider about preconception counseling.  If you may become pregnant, take 400 to 800 micrograms  (mcg) of folic acid every day.  If you want to prevent pregnancy, talk to your health care provider about birth control (contraception). Osteoporosis and menopause  Osteoporosis is a disease in which the bones lose minerals and strength with aging. This can result in serious bone fractures. Your risk for osteoporosis can be identified using a bone density scan.  If you are 20 years of age or older, or if you are at risk for osteoporosis and fractures, ask your health care provider if you should be screened.  Ask your health care provider whether you should take a calcium or vitamin D supplement to lower your risk for osteoporosis.  Menopause may have certain physical symptoms and risks.  Hormone replacement therapy may reduce some of these symptoms and risks. Talk to your health care provider about whether hormone replacement therapy is right for you. Follow these instructions at home:  Schedule regular health, dental, and eye exams.  Stay current with your immunizations.  Do not use any tobacco products including cigarettes, chewing tobacco, or electronic cigarettes.  If you are pregnant, do not drink alcohol.  If you are breastfeeding, limit how much and how often you drink alcohol.  Limit alcohol intake to no more than 1 drink per day for nonpregnant women. One drink equals 12 ounces of beer, 5 ounces of wine, or 1 ounces of hard liquor.  Do not use street drugs.  Do not share needles.  Ask your health care provider for help if you need support or information about quitting drugs.  Tell your health care provider if you often feel depressed.  Tell your health care provider if you have ever been abused or do not feel safe at home. This information is not intended to replace advice given to  you by your health care provider. Make sure you discuss any questions you have with your health care provider. Document Released: 12/12/2010 Document Revised: 11/04/2015 Document Reviewed:  03/02/2015 Elsevier Interactive Patient Education  [employer].

## 2018-05-28 NOTE — Progress Notes (Addendum)
Subjective:   Lori Oconnell is a 57 y.o. female who presents for Medicare Annual (Subsequent) preventive examination.  Review of Systems:  No ROS.  Medicare Wellness Visit. Additional risk factors are reflected in the social history.  Cardiac Risk Factors include: advanced age (>51men, >56 women);dyslipidemia;hypertension Sleep patterns: feels rested on waking, gets up 3-4 times nightly to void and sleeps 5-6 hours nightly.   Home Safety/Smoke Alarms: Feels safe in home. Smoke alarms in place.   Living environment; residence and Firearm Safety: 1-story house/ trailer, no firearms. Lives with husband, no needs for DME, good support system Seat Belt Safety/Bike Helmet: Wears seat belt.      Objective:     Vitals: BP (!) 162/94   Pulse 94   Resp 18   Ht 5\' 4"  (1.626 m)   Wt 176 lb (79.8 kg)   LMP 03/19/2011   SpO2 99%   BMI 30.21 kg/m   Body mass index is 30.21 kg/m.  Advanced Directives 05/28/2018 05/09/2017 06/09/2016 03/24/2016 01/10/2016 11/04/2015 08/21/2012  Does Patient Have a Medical Advance Directive? No No No No No No Patient would not like information  Would patient like information on creating a medical advance directive? - No - Patient declined - - No - patient declined information No - patient declined information -    Tobacco Social History   Tobacco Use  Smoking Status Current Some Day Smoker  . Packs/day: 0.25  . Years: 5.00  . Pack years: 1.25  . Types: Cigarettes  Smokeless Tobacco Former Systems developer  . Types: Chew  Tobacco Comment   smokes 2 to 3 cigarettes a day     Ready to quit: No Counseling given: No Comment: smokes 2 to 3 cigarettes a day  Past Medical History:  Diagnosis Date  . Anxiety   . [place] DISEASE, CERVICAL 07/13/2009  . Edema 03/22/2011  . HEMATOCHEZIA 11/30/2009  . HYPERLIPIDEMIA 07/18/2009  . HYPERTENSION 07/12/2009  . Irregular menses 03/22/2011  . NECK PAIN 07/12/2009  . OVERACTIVE BLADDER 07/12/2009  . RENAL INSUFFICIENCY 07/12/2009    . SINUSITIS- ACUTE-NOS 07/12/2009  . Sleep apnea    prescribed cpap but has not gotten  . TRANSAMINASES, SERUM, ELEVATED 07/12/2009   Past Surgical History:  Procedure Laterality Date  . MASS EXCISION N/A 08/27/2012   Procedure: EXCISION MASS UPPER BACK;  Surgeon: Joyice Faster. Cornett, MD;  Location: [facility];  Service: General;  Laterality: N/A;  . s/p ganglion cyst Right 1980  . TUBAL LIGATION  1990   Family History  Problem Relation Age of Onset  . Hypertension Mother   . Diabetes Maternal Aunt   . Colon cancer Neg Hx    Social History   Socioeconomic History  . Marital status: Single    Spouse name: Not on file  . Number of children: 2  . Years of education: Not on file  . Highest education level: Not on file  Occupational History  . Occupation: CNA  Social Needs  . Financial resource strain: Not hard at all  . Food insecurity:    Worry: Never true    Inability: Never true  . Transportation needs:    Medical: No    Non-medical: No  Tobacco Use  . Smoking status: Current Some Day Smoker    Packs/day: 0.25    Years: 5.00    Pack years: 1.25    Types: Cigarettes  . Smokeless tobacco: Former Systems developer    Types: Chew  . Tobacco comment: smokes 2 to 3  cigarettes a day  Substance and Sexual Activity  . Alcohol use: Yes    Alcohol/week: 3.0 standard drinks    Types: 3 Standard drinks or equivalent per week    Comment: Paitlyn Mcclatchey, beer weekly   . Drug use: No  . Sexual activity: Never    Birth control/protection: None  Lifestyle  . Physical activity:    Days per week: 7 days    Minutes per session: 70 min  . Stress: Not at all  Relationships  . Social connections:    Talks on phone: More than three times a week    Gets together: More than three times a week    Attends religious service: More than 4 times per year    Active member of club or organization: Yes    Attends meetings of clubs or organizations: More than 4 times per year    Relationship status: Married  Other  Topics Concern  . Not on file  Social History Narrative  . Not on file    Outpatient Encounter Medications as of 05/28/2018  Medication Sig  . amLODipine (NORVASC) 5 MG tablet Take 1 tablet (5 mg total) by mouth daily.  Marland Kitchen aspirin 81 MG tablet Take 81 mg by mouth daily.    Marland Kitchen escitalopram (LEXAPRO) 10 MG tablet Take 1 tablet (10 mg total) by mouth daily.  . naproxen (NAPROSYN) 500 MG tablet Take 1 tablet (500 mg total) by mouth 2 (two) times daily with a meal.  . solifenacin (VESICARE) 5 MG tablet Take 1 tablet (5 mg total) by mouth daily.   No facility-administered encounter medications on file as of 05/28/2018.     Activities of Daily Living In your present state of health, do you have any difficulty performing the following activities: 05/28/2018  Hearing? N  Vision? N  Difficulty concentrating or making decisions? N  Walking or climbing stairs? N  Dressing or bathing? N  Doing errands, shopping? N  Preparing Food and eating ? N  Using the Toilet? N  In the past six months, have you accidently leaked urine? Y  Comment overactive bladder  Do you have problems with loss of bowel control? N  Managing your Medications? N  Managing your Finances? N  Housekeeping or managing your Housekeeping? N  Some recent data might be hidden    Patient Care Team: [name], MD as PCP - General    Assessment:   This is a routine wellness examination for Jmya. Physical assessment deferred to PCP.   Exercise Activities and Dietary recommendations Current Exercise Habits: Home exercise routine;The patient has a physically strenous job, but has no regular exercise apart from work., Type of exercise: walking, Time (Minutes): 50, Frequency (Times/Week): 6, Weekly Exercise (Minutes/Week): 300, Intensity: Mild, Exercise limited by: None identified  Diet (meal preparation, eat out, water intake, caffeinated beverages, dairy products, fruits and vegetables): in general, a "healthy" diet  , well  balanced.  Reviewed heart healthy diet. Encouraged patient to increase daily water and healthy fluid intake.  Goals    . patient     Patient  Centered goal is to stay on top of her health to the best of her ability     . Patient Stated     I want to be healthy as possible, I want to continue to eat healthy, watch fat intake, increase water intake, increase physical activity, enjoy life and family.    . Patient Stated     Stay positive, talk to God  and prepare for the upcoming surgery.       Fall Risk Fall Risk  05/28/2018 05/15/2018 05/09/2017 01/10/2016 11/04/2015  Falls in the past year? 0 0 Yes Yes No  Number falls in past yr: - - 1 2 or more -  Injury with Fall? - - No Yes -  Risk Factor Category  - - - High Fall Risk -  Risk for fall due to : - - - Impaired balance/gait;Impaired mobility;History of fall(s) -  Follow up - - - Education provided;Falls prevention discussed -    Depression Screen PHQ 2/9 Scores 05/28/2018 05/15/2018 05/09/2017 01/10/2016  PHQ - 2 Score 1 1 2 1   PHQ- 9 Score - - 7 -     Cognitive Function MMSE - Mini Mental State Exam 05/09/2017  Orientation to time 5  Orientation to Place 5       Ad8 score reviewed for issues:  Issues making decisions: no  Less interest in hobbies / activities: no  Repeats questions, stories (family complaining): no  Trouble using ordinary gadgets (microwave, computer, phone):no  Forgets the month or year: no  Mismanaging finances: no  Remembering appts: no  Daily problems with thinking and/or memory: no Ad8 score is= 0  Immunization History  Administered Date(s) Administered  . Influenza Split 03/22/2011  . Influenza Whole 07/12/2009  . Influenza,inj,Quad PF,6+ Mos 07/02/2013, 05/26/2016, 05/09/2017, 05/15/2018  . Td 07/12/2009   Screening Tests Health Maintenance  Topic Date Due  . COLONOSCOPY  05/16/2019 (Originally 09/29/2012)  . MAMMOGRAM  06/14/2019  . TETANUS/TDAP  07/13/2019  . PAP  SMEAR-Modifier  06/08/2020  . INFLUENZA VACCINE  Completed  . Hepatitis C Screening  Completed  . HIV Screening  Completed      Plan:     Continue doing brain stimulating activities (puzzles, reading, adult coloring books, staying active) to keep memory sharp.   Continue to eat heart healthy diet (full of fruits, vegetables, whole grains, lean protein, water--limit salt, fat, and sugar intake) and increase physical activity as tolerated.  I have personally reviewed and noted the following in the patient's chart:   . Medical and social history . Use of alcohol, tobacco or illicit drugs  . Current medications and supplements . Functional ability and status . Nutritional status . Physical activity . Advanced directives . List of other physicians . Vitals . Screenings to include cognitive, depression, and falls . Referrals and appointments  In addition, I have reviewed and discussed with patient certain preventive protocols, quality metrics, and best practice recommendations. A written personalized care plan for preventive services as well as general preventive health recommendations were provided to patient.     Michiel Cowboy, RN  05/28/2018    Medical screening examination/treatment/procedure(s) were performed by non-physician practitioner and as supervising physician I was immediately available for consultation/collaboration. I agree with above. Cathlean Cower, MD

## 2018-06-19 ENCOUNTER — Other Ambulatory Visit: Payer: Self-pay | Admitting: Gastroenterology

## 2018-06-19 DIAGNOSIS — R945 Abnormal results of liver function studies: Secondary | ICD-10-CM

## 2018-06-19 DIAGNOSIS — K760 Fatty (change of) liver, not elsewhere classified: Secondary | ICD-10-CM | POA: Diagnosis not present

## 2018-06-19 DIAGNOSIS — R7989 Other specified abnormal findings of blood chemistry: Secondary | ICD-10-CM

## 2018-06-21 ENCOUNTER — Ambulatory Visit
Admission: RE | Admit: 2018-06-21 | Discharge: 2018-06-21 | Disposition: A | Discharge status: Home / Self Care | Payer: Medicare Other | Source: Ambulatory Visit | Attending: Gastroenterology | Admitting: Gastroenterology

## 2018-06-21 DIAGNOSIS — K760 Fatty (change of) liver, not elsewhere classified: Secondary | ICD-10-CM | POA: Diagnosis not present

## 2018-06-21 DIAGNOSIS — R945 Abnormal results of liver function studies: Secondary | ICD-10-CM

## 2018-06-21 DIAGNOSIS — Z7289 Other problems related to lifestyle: Secondary | ICD-10-CM | POA: Diagnosis not present

## 2018-06-21 DIAGNOSIS — R7989 Other specified abnormal findings of blood chemistry: Secondary | ICD-10-CM

## 2018-07-10 DIAGNOSIS — E8889 Other specified metabolic disorders: Secondary | ICD-10-CM | POA: Insufficient documentation

## 2019-06-12 ENCOUNTER — Other Ambulatory Visit: Payer: Self-pay

## 2019-06-12 ENCOUNTER — Ambulatory Visit (INDEPENDENT_AMBULATORY_CARE_PROVIDER_SITE_OTHER): Payer: Medicare Other | Admitting: Internal Medicine

## 2019-06-12 ENCOUNTER — Encounter: Payer: Medicare Other | Admitting: Internal Medicine

## 2019-06-12 ENCOUNTER — Encounter: Payer: Self-pay | Admitting: Internal Medicine

## 2019-06-12 VITALS — BP 148/88 | HR 93 | Temp 98.5°F | Resp 12 | Ht 66.0 in | Wt 177.0 lb

## 2019-06-12 DIAGNOSIS — Z0001 Encounter for general adult medical examination with abnormal findings: Secondary | ICD-10-CM | POA: Diagnosis not present

## 2019-06-12 DIAGNOSIS — R945 Abnormal results of liver function studies: Secondary | ICD-10-CM

## 2019-06-12 DIAGNOSIS — E785 Hyperlipidemia, unspecified: Secondary | ICD-10-CM

## 2019-06-12 DIAGNOSIS — R7989 Other specified abnormal findings of blood chemistry: Secondary | ICD-10-CM | POA: Insufficient documentation

## 2019-06-12 DIAGNOSIS — E559 Vitamin D deficiency, unspecified: Secondary | ICD-10-CM | POA: Diagnosis not present

## 2019-06-12 DIAGNOSIS — J309 Allergic rhinitis, unspecified: Secondary | ICD-10-CM

## 2019-06-12 DIAGNOSIS — E538 Deficiency of other specified B group vitamins: Secondary | ICD-10-CM | POA: Diagnosis not present

## 2019-06-12 DIAGNOSIS — H1013 Acute atopic conjunctivitis, bilateral: Secondary | ICD-10-CM | POA: Diagnosis not present

## 2019-06-12 DIAGNOSIS — I10 Essential (primary) hypertension: Secondary | ICD-10-CM | POA: Diagnosis not present

## 2019-06-12 DIAGNOSIS — Z23 Encounter for immunization: Secondary | ICD-10-CM | POA: Diagnosis not present

## 2019-06-12 DIAGNOSIS — E611 Iron deficiency: Secondary | ICD-10-CM

## 2019-06-12 DIAGNOSIS — H1010 Acute atopic conjunctivitis, unspecified eye: Secondary | ICD-10-CM | POA: Insufficient documentation

## 2019-06-12 LAB — URINALYSIS, ROUTINE W REFLEX MICROSCOPIC
Hgb urine dipstick: NEGATIVE
Ketones, ur: 15 — AB
Nitrite: POSITIVE — AB
Specific Gravity, Urine: >=1.03 — AB (ref 1.000–1.030)
Urine Glucose: NEGATIVE
Urobilinogen, UA: 1 (ref 0.0–1.0)
pH: 6 (ref 5.0–8.0)

## 2019-06-12 LAB — BASIC METABOLIC PANEL
BUN: 12 mg/dL (ref 6–23)
CO2: 30 mEq/L (ref 19–32)
Calcium: 9.4 mg/dL (ref 8.4–10.5)
Chloride: 101 mEq/L (ref 96–112)
Creatinine, Ser: 0.64 mg/dL (ref 0.40–1.20)
GFR: 115.86 mL/min (ref >60.00)
Glucose, Bld: 94 mg/dL (ref 70–99)
Potassium: 3.8 mEq/L (ref 3.5–5.1)
Sodium: 139 mEq/L (ref 135–145)

## 2019-06-12 LAB — HEPATIC FUNCTION PANEL
ALT: 21 U/L (ref 0–35)
AST: 54 U/L — ABNORMAL HIGH (ref 0–37)
Albumin: 4.1 g/dL (ref 3.5–5.2)
Alkaline Phosphatase: 107 U/L (ref 39–117)
Bilirubin, Direct: 0.4 mg/dL — ABNORMAL HIGH (ref 0.0–0.3)
Total Bilirubin: 1.7 mg/dL — ABNORMAL HIGH (ref 0.2–1.2)
Total Protein: 7.7 g/dL (ref 6.0–8.3)

## 2019-06-12 LAB — CBC WITH DIFFERENTIAL/PLATELET
Basophils Absolute: 0.1 10*3/uL (ref 0.0–0.1)
Basophils Relative: 0.8 % (ref 0.0–3.0)
Eosinophils Absolute: 0.4 10*3/uL (ref 0.0–0.7)
Eosinophils Relative: 7 % — ABNORMAL HIGH (ref 0.0–5.0)
HCT: 36.9 % (ref 36.0–46.0)
Hemoglobin: 13 g/dL (ref 12.0–15.0)
Lymphocytes Relative: 23.9 % (ref 12.0–46.0)
Lymphs Abs: 1.5 10*3/uL (ref 0.7–4.0)
MCHC: 35.4 g/dL (ref 30.0–36.0)
MCV: 112.2 fl — ABNORMAL HIGH (ref 78.0–100.0)
Monocytes Absolute: 0.7 10*3/uL (ref 0.1–1.0)
Monocytes Relative: 11.8 % (ref 3.0–12.0)
Neutro Abs: 3.5 10*3/uL (ref 1.4–7.7)
Neutrophils Relative %: 56.5 % (ref 43.0–77.0)
Platelets: 126 10*3/uL — ABNORMAL LOW (ref 150.0–400.0)
RBC: 3.29 Mil/uL — ABNORMAL LOW (ref 3.87–5.11)
RDW: 13.3 % (ref 11.5–15.5)
WBC: 6.3 10*3/uL (ref 4.0–10.5)

## 2019-06-12 LAB — LIPID PANEL
Cholesterol: 204 mg/dL — ABNORMAL HIGH (ref 0–200)
HDL: 87.4 mg/dL (ref >39.00)
LDL Cholesterol: 104 mg/dL — ABNORMAL HIGH (ref 0–99)
NonHDL: 116.53
Total CHOL/HDL Ratio: 2
Triglycerides: 65 mg/dL (ref 0.0–149.0)
VLDL: 13 mg/dL (ref 0.0–40.0)

## 2019-06-12 LAB — IBC PANEL
Iron: 129 ug/dL (ref 42–145)
Saturation Ratios: 43.5 % (ref 20.0–50.0)
Transferrin: 212 mg/dL (ref 212.0–360.0)

## 2019-06-12 LAB — VITAMIN D 25 HYDROXY (VIT D DEFICIENCY, FRACTURES): VITD: 7.92 ng/mL — ABNORMAL LOW (ref 30.00–100.00)

## 2019-06-12 LAB — TSH: TSH: 1.14 u[IU]/mL (ref 0.35–4.50)

## 2019-06-12 LAB — VITAMIN B12: Vitamin B-12: 431 pg/mL (ref 211–911)

## 2019-06-12 MED ORDER — TRIAMCINOLONE ACETONIDE 55 MCG/ACT NA AERO: 2.0000 | INHALATION_SPRAY | Freq: Every day | NASAL | 12 refills | Status: DC

## 2019-06-12 MED ORDER — AZELASTINE HCL 0.05 % OP SOLN: 1.0000 [drp] | Freq: Two times a day (BID) | OPHTHALMIC | 12 refills | Status: DC

## 2019-06-12 MED ORDER — ESCITALOPRAM OXALATE 10 MG PO TABS: 10.0000 mg | ORAL_TABLET | Freq: Every day | ORAL | 3 refills | Status: DC

## 2019-06-12 MED ORDER — AMLODIPINE BESYLATE 5 MG PO TABS: 5.0000 mg | ORAL_TABLET | Freq: Every day | ORAL | 3 refills | Status: DC

## 2019-06-12 NOTE — Assessment & Plan Note (Addendum)
With very high MCV, etiology unclear, does not want referall back to [facility], for GI referral locally  In addition to the time spent performing CPE, I spent an additional 25 minutes face to face,in which greater than 50% of this time was spent in counseling and coordination of care for patient's acute illness as documented, including the differential dx, treatment, further evaluation and other management of abnormal lfts, allergic conjunctivits, allergi rhinitis, HTN, HLD

## 2019-06-12 NOTE — Assessment & Plan Note (Signed)
stable overall by history and exam, recent data reviewed with pt, and pt to continue medical treatment as before,  to f/u any worsening symptoms or concerns  

## 2019-06-12 NOTE — Assessment & Plan Note (Signed)

## 2019-06-12 NOTE — Assessment & Plan Note (Signed)
Mild to mod, for opitvar asd, to f/u any worsening symptoms or concerns

## 2019-06-12 NOTE — Patient Instructions (Addendum)
You had the Shingrix shot #1 today  Please return in 2 mo or just after for Shingrix shot #2  Please take all new medication as prescribed  - the nasacort and optivar for allergies  You can also take OTC Loratidine 10 mg per day  Please continue all other medications as before, and refills have been done if requested.  Please have the pharmacy call with any other refills you may need.  Please continue your efforts at being more active, low cholesterol diet, and weight control.  You are otherwise up to date with prevention measures today.  Please keep your appointments with your specialists as you may have planned  You will be contacted regarding the referral for: GI at [facility] for the high liver tests  Please go to the LAB at the blood drawing area for the tests to be done  You will be contacted by phone if any changes need to be made immediately.  Otherwise, you will receive a letter about your results with an explanation, but please check with MyChart first.  Please remember to sign up for MyChart if you have not done so, as this will be important to you in the future with finding out test results, communicating by private email, and scheduling acute appointments online when needed.  Please return in 1 year for your yearly visit, or sooner if needed

## 2019-06-12 NOTE — Progress Notes (Signed)
Subjective:    Patient ID: Lori Oconnell, female    DOB: 1961-02-03, 58 y.o.   MRN: [id number]  HPI  Here for wellness and f/u;  Overall doing ok;  Pt denies Chest pain, worsening SOB, DOE, wheezing, orthopnea, PND, worsening LE edema, palpitations, dizziness or syncope.  Pt denies neurological change such as new headache, facial or extremity weakness.  Pt denies polydipsia, polyuria, or low sugar symptoms. Pt states overall good compliance with treatment and medications, good tolerability, and has been trying to follow appropriate diet.  Pt denies worsening depressive symptoms, suicidal ideation or panic. No fever, night sweats, wt loss, loss of appetite, or other constitutional symptoms.  Pt states good ability with ADL's, has low fall risk, home safety reviewed and adequate, no other significant changes in hearing or vision, and only occasionally active with exercise. Also with c/o eye and nasal allergy symptoms - Does have several wks ongoing nasal allergy symptoms with clearish congestion, itch and sneezing, without fever, pain, ST, cough, swelling or wheezing.  Also Denies worsening reflux, abd pain, dysphagia, n/v, bowel change or blood, but has hx of persistent elev LFTs seen at [facility] but does not want to return there for f/u after lost to f/u in the pandemic Past Medical History:  Diagnosis Date  . Anxiety   . [place] DISEASE, CERVICAL 07/13/2009  . Edema 03/22/2011  . HEMATOCHEZIA 11/30/2009  . HYPERLIPIDEMIA 07/18/2009  . HYPERTENSION 07/12/2009  . Irregular menses 03/22/2011  . NECK PAIN 07/12/2009  . OVERACTIVE BLADDER 07/12/2009  . RENAL INSUFFICIENCY 07/12/2009  . SINUSITIS- ACUTE-NOS 07/12/2009  . Sleep apnea    prescribed cpap but has not gotten  . TRANSAMINASES, SERUM, ELEVATED 07/12/2009   Past Surgical History:  Procedure Laterality Date  . MASS EXCISION N/A 08/27/2012   Procedure: EXCISION MASS UPPER BACK;  Surgeon: Joyice Faster. Cornett, MD;  Location: [facility];  Service: General;   Laterality: N/A;  . s/p ganglion cyst Right 1980  . TUBAL LIGATION  1990    reports that she has been smoking cigarettes. She has a 1.25 pack-year smoking history. She has quit using smokeless tobacco.  Her smokeless tobacco use included chew. She reports current alcohol use of about 3.0 standard drinks of alcohol per week. She reports that she does not use drugs. family history includes Diabetes in her maternal aunt; Hypertension in her mother. No Known Allergies Current Outpatient Medications on File Prior to Visit  Medication Sig Dispense Refill  . aspirin 81 MG tablet Take 81 mg by mouth daily.      . naproxen (NAPROSYN) 500 MG tablet Take 1 tablet (500 mg total) by mouth 2 (two) times daily with a meal. 60 tablet 5   No current facility-administered medications on file prior to visit.   Review of Systems Constitutional: Negative for other unusual diaphoresis, sweats, appetite or weight changes HENT: Negative for other worsening hearing loss, ear pain, facial swelling, mouth sores or neck stiffness.   Eyes: Negative for other worsening pain, redness or other visual disturbance.  Respiratory: Negative for other stridor or swelling Cardiovascular: Negative for other palpitations or other chest pain  Gastrointestinal: Negative for worsening diarrhea or loose stools, blood in stool, distention or other pain Genitourinary: Negative for hematuria, flank pain or other change in urine volume.  Musculoskeletal: Negative for myalgias or other joint swelling.  Skin: Negative for other color change, or other wound or worsening drainage.  Neurological: Negative for other syncope or numbness. Hematological: Negative for other  adenopathy or swelling Psychiatric/Behavioral: Negative for hallucinations, other worsening agitation, SI, self-injury, or new decreased concentration All otherwise neg per pt     Objective:   Physical Exam BP (!) 148/88   Pulse 93   Temp 98.5 F (36.9 C)   Resp 12    Ht 5\' 6"  (1.676 m)   Wt 177 lb (80.3 kg)   LMP 03/19/2011   SpO2 99%   BMI 28.57 kg/m  VS noted,  Constitutional: Pt is oriented to person, place, and time. Appears well-developed and well-nourished, in no significant distress and comfortable Head: Normocephalic and atraumatic  Eyes: Conjunctivae with bilat eythem and clear d/cm and EOM are normal. Pupils are equal, round, and reactive to light Bilat tm's with mild erythema.  Max sinus areas non tender.  Pharynx with mild erythema, no exudate Right Ear: External ear normal without discharge Left Ear: External ear normal without discharge Nose: Nose without discharge or deformity Mouth/Throat: Oropharynx is without other ulcerations and moist  Neck: Normal range of motion. Neck supple. No JVD present. No tracheal deviation present or significant neck LA or mass Cardiovascular: Normal rate, regular rhythm, normal heart sounds and intact distal pulses.   Pulmonary/Chest: WOB normal and breath sounds without rales or wheezing  Abdominal: Soft. Bowel sounds are normal. NT. No HSM  Musculoskeletal: Normal range of motion. Exhibits no edema Lymphadenopathy: Has no other cervical adenopathy.  Neurological: Pt is alert and oriented to person, place, and time. Pt has normal reflexes. No cranial nerve deficit. Motor grossly intact, Gait intact Skin: Skin is warm and dry. No rash noted or new ulcerations Psychiatric:  Has normal mood and affect. Behavior is normal without agitation All otherwise neg per pt  Lab Results  Component Value Date   WBC 6.3 06/12/2019   HGB 13.0 06/12/2019   HCT 36.9 06/12/2019   PLT 126.0 (L) 06/12/2019   GLUCOSE 94 06/12/2019   CHOL 204 (H) 06/12/2019   TRIG 65.0 06/12/2019   HDL 87.40 06/12/2019   LDLDIRECT 119.0 11/04/2015   LDLCALC 104 (H) 06/12/2019   ALT 21 06/12/2019   AST 54 (H) 06/12/2019   NA 139 06/12/2019   K 3.8 06/12/2019   CL 101 06/12/2019   CREATININE 0.64 06/12/2019   BUN 12 06/12/2019    CO2 30 06/12/2019   TSH 1.14 06/12/2019        Assessment & Plan:

## 2019-06-12 NOTE — Assessment & Plan Note (Signed)
Mild to mod, for nasacort asd,  to f/u any worsening symptoms or concerns 

## 2019-06-15 ENCOUNTER — Other Ambulatory Visit: Payer: Self-pay | Admitting: Internal Medicine

## 2019-06-15 MED ORDER — VITAMIN D (ERGOCALCIFEROL) 1.25 MG (50000 UNIT) PO CAPS: 50000.0000 [IU] | ORAL_CAPSULE | ORAL | 0 refills | Status: AC

## 2019-08-30 ENCOUNTER — Other Ambulatory Visit: Payer: Self-pay | Admitting: Internal Medicine

## 2019-08-31 NOTE — Telephone Encounter (Signed)
Please change to OTC Vitamin D3 at 2000 units per day, indefinitely.  

## 2019-09-07 DIAGNOSIS — T7840XA Allergy, unspecified, initial encounter: Secondary | ICD-10-CM | POA: Diagnosis not present

## 2019-09-07 DIAGNOSIS — R600 Localized edema: Secondary | ICD-10-CM | POA: Diagnosis not present

## 2019-09-07 DIAGNOSIS — I10 Essential (primary) hypertension: Secondary | ICD-10-CM | POA: Diagnosis not present

## 2019-09-29 ENCOUNTER — Other Ambulatory Visit: Payer: Self-pay | Admitting: Internal Medicine

## 2019-09-29 DIAGNOSIS — Z1231 Encounter for screening mammogram for malignant neoplasm of breast: Secondary | ICD-10-CM

## 2020-01-06 ENCOUNTER — Other Ambulatory Visit: Payer: Self-pay

## 2020-01-06 MED ORDER — ESCITALOPRAM OXALATE 10 MG PO TABS: 10.0000 mg | ORAL_TABLET | Freq: Every day | ORAL | 3 refills | Status: DC

## 2020-01-06 MED ORDER — AMLODIPINE BESYLATE 5 MG PO TABS: 5.0000 mg | ORAL_TABLET | Freq: Every day | ORAL | 3 refills | Status: DC

## 2020-06-15 ENCOUNTER — Telehealth: Payer: Self-pay | Admitting: Internal Medicine

## 2020-06-15 NOTE — Telephone Encounter (Signed)
LVM for pt to rtn my call to schedule AWV with [facility]. Please schedule this appt if pt calls the office.  °

## 2020-06-16 ENCOUNTER — Encounter: Payer: Medicare Other | Admitting: Internal Medicine

## 2020-07-12 ENCOUNTER — Encounter: Payer: Medicare Other | Admitting: Internal Medicine

## 2020-07-26 ENCOUNTER — Encounter: Payer: Medicare Other | Admitting: Internal Medicine

## 2020-08-11 ENCOUNTER — Encounter: Payer: Medicare Other | Admitting: Internal Medicine

## 2020-08-17 ENCOUNTER — Other Ambulatory Visit: Payer: Self-pay | Admitting: Internal Medicine

## 2020-09-03 ENCOUNTER — Other Ambulatory Visit: Payer: Self-pay | Admitting: Internal Medicine

## 2020-09-13 ENCOUNTER — Encounter: Payer: Medicare Other | Admitting: Internal Medicine

## 2020-09-23 ENCOUNTER — Other Ambulatory Visit: Payer: Self-pay

## 2020-09-27 ENCOUNTER — Encounter: Payer: Medicare Other | Admitting: Internal Medicine

## 2020-10-08 ENCOUNTER — Telehealth: Payer: Self-pay | Admitting: Internal Medicine

## 2020-10-08 NOTE — Telephone Encounter (Signed)
Left patient a voicemail with the below message.

## 2020-10-08 NOTE — Telephone Encounter (Signed)
Ok for rest, fluids,tyenlol and immodium prn but may need to be seen for worsening n/v, abd pain, fever, chills, blood or weakness or falls

## 2020-10-08 NOTE — Telephone Encounter (Signed)
Seeking advice    Patient seeking advice for diarrhea x3 days She has no other symptoms  Seeking advice for OTC medication

## 2020-10-18 ENCOUNTER — Emergency Department (HOSPITAL_COMMUNITY): Payer: Medicare Other

## 2020-10-18 ENCOUNTER — Encounter (HOSPITAL_COMMUNITY): Payer: Self-pay

## 2020-10-18 ENCOUNTER — Other Ambulatory Visit: Payer: Self-pay

## 2020-10-18 ENCOUNTER — Inpatient Hospital Stay (HOSPITAL_COMMUNITY)
Admission: EM | Admit: 2020-10-18 | Discharge: 2020-10-27 | DRG: 433 | Disposition: A | Discharge status: Home Health | Payer: Medicare Other | Attending: Internal Medicine | Admitting: Internal Medicine

## 2020-10-18 ENCOUNTER — Inpatient Hospital Stay (HOSPITAL_COMMUNITY): Payer: Medicare Other

## 2020-10-18 DIAGNOSIS — N179 Acute kidney failure, unspecified: Secondary | ICD-10-CM | POA: Diagnosis present

## 2020-10-18 DIAGNOSIS — Z743 Need for continuous supervision: Secondary | ICD-10-CM | POA: Diagnosis not present

## 2020-10-18 DIAGNOSIS — D638 Anemia in other chronic diseases classified elsewhere: Secondary | ICD-10-CM | POA: Diagnosis not present

## 2020-10-18 DIAGNOSIS — E860 Dehydration: Secondary | ICD-10-CM | POA: Diagnosis not present

## 2020-10-18 DIAGNOSIS — R791 Abnormal coagulation profile: Secondary | ICD-10-CM

## 2020-10-18 DIAGNOSIS — K8020 Calculus of gallbladder without cholecystitis without obstruction: Secondary | ICD-10-CM | POA: Diagnosis present

## 2020-10-18 DIAGNOSIS — D649 Anemia, unspecified: Secondary | ICD-10-CM

## 2020-10-18 DIAGNOSIS — E876 Hypokalemia: Secondary | ICD-10-CM | POA: Diagnosis present

## 2020-10-18 DIAGNOSIS — I10 Essential (primary) hypertension: Secondary | ICD-10-CM | POA: Diagnosis present

## 2020-10-18 DIAGNOSIS — K7030 Alcoholic cirrhosis of liver without ascites: Secondary | ICD-10-CM | POA: Diagnosis present

## 2020-10-18 DIAGNOSIS — F419 Anxiety disorder, unspecified: Secondary | ICD-10-CM | POA: Diagnosis present

## 2020-10-18 DIAGNOSIS — K7682 Hepatic encephalopathy: Secondary | ICD-10-CM | POA: Diagnosis present

## 2020-10-18 DIAGNOSIS — F17210 Nicotine dependence, cigarettes, uncomplicated: Secondary | ICD-10-CM | POA: Diagnosis present

## 2020-10-18 DIAGNOSIS — K760 Fatty (change of) liver, not elsewhere classified: Secondary | ICD-10-CM | POA: Diagnosis not present

## 2020-10-18 DIAGNOSIS — K7460 Unspecified cirrhosis of liver: Secondary | ICD-10-CM | POA: Diagnosis not present

## 2020-10-18 DIAGNOSIS — F1010 Alcohol abuse, uncomplicated: Secondary | ICD-10-CM | POA: Diagnosis present

## 2020-10-18 DIAGNOSIS — K7290 Hepatic failure, unspecified without coma: Secondary | ICD-10-CM

## 2020-10-18 DIAGNOSIS — Z7982 Long term (current) use of aspirin: Secondary | ICD-10-CM | POA: Diagnosis not present

## 2020-10-18 DIAGNOSIS — K7010 Alcoholic hepatitis without ascites: Secondary | ICD-10-CM | POA: Diagnosis present

## 2020-10-18 DIAGNOSIS — N3281 Overactive bladder: Secondary | ICD-10-CM | POA: Diagnosis not present

## 2020-10-18 DIAGNOSIS — K7040 Alcoholic hepatic failure without coma: Secondary | ICD-10-CM | POA: Diagnosis present

## 2020-10-18 DIAGNOSIS — D539 Nutritional anemia, unspecified: Secondary | ICD-10-CM | POA: Diagnosis not present

## 2020-10-18 DIAGNOSIS — R404 Transient alteration of awareness: Secondary | ICD-10-CM | POA: Diagnosis not present

## 2020-10-18 DIAGNOSIS — Z20822 Contact with and (suspected) exposure to covid-19: Secondary | ICD-10-CM | POA: Diagnosis not present

## 2020-10-18 DIAGNOSIS — E785 Hyperlipidemia, unspecified: Secondary | ICD-10-CM | POA: Diagnosis present

## 2020-10-18 DIAGNOSIS — Z79899 Other long term (current) drug therapy: Secondary | ICD-10-CM | POA: Diagnosis not present

## 2020-10-18 DIAGNOSIS — Z8249 Family history of ischemic heart disease and other diseases of the circulatory system: Secondary | ICD-10-CM | POA: Diagnosis not present

## 2020-10-18 DIAGNOSIS — K8689 Other specified diseases of pancreas: Secondary | ICD-10-CM | POA: Diagnosis not present

## 2020-10-18 DIAGNOSIS — R188 Other ascites: Secondary | ICD-10-CM

## 2020-10-18 DIAGNOSIS — R531 Weakness: Secondary | ICD-10-CM | POA: Diagnosis not present

## 2020-10-18 DIAGNOSIS — G4730 Sleep apnea, unspecified: Secondary | ICD-10-CM | POA: Diagnosis not present

## 2020-10-18 DIAGNOSIS — D688 Other specified coagulation defects: Secondary | ICD-10-CM | POA: Diagnosis not present

## 2020-10-18 DIAGNOSIS — K7201 Acute and subacute hepatic failure with coma: Secondary | ICD-10-CM

## 2020-10-18 DIAGNOSIS — R6889 Other general symptoms and signs: Secondary | ICD-10-CM | POA: Diagnosis not present

## 2020-10-18 DIAGNOSIS — E871 Hypo-osmolality and hyponatremia: Secondary | ICD-10-CM | POA: Diagnosis not present

## 2020-10-18 DIAGNOSIS — R748 Abnormal levels of other serum enzymes: Secondary | ICD-10-CM | POA: Diagnosis not present

## 2020-10-18 DIAGNOSIS — M47819 Spondylosis without myelopathy or radiculopathy, site unspecified: Secondary | ICD-10-CM | POA: Diagnosis not present

## 2020-10-18 DIAGNOSIS — D696 Thrombocytopenia, unspecified: Secondary | ICD-10-CM | POA: Diagnosis present

## 2020-10-18 DIAGNOSIS — R4182 Altered mental status, unspecified: Secondary | ICD-10-CM | POA: Diagnosis present

## 2020-10-18 DIAGNOSIS — I499 Cardiac arrhythmia, unspecified: Secondary | ICD-10-CM | POA: Diagnosis not present

## 2020-10-18 DIAGNOSIS — R410 Disorientation, unspecified: Secondary | ICD-10-CM | POA: Diagnosis not present

## 2020-10-18 LAB — CBC WITH DIFFERENTIAL/PLATELET
Abs Immature Granulocytes: 0.27 10*3/uL — ABNORMAL HIGH (ref 0.00–0.07)
Basophils Absolute: 0 10*3/uL (ref 0.0–0.1)
Basophils Relative: 0 %
Eosinophils Absolute: 0 10*3/uL (ref 0.0–0.5)
Eosinophils Relative: 0 %
HCT: UNDETERMINED % (ref 36.0–46.0)
Hemoglobin: 9.7 g/dL — ABNORMAL LOW (ref 12.0–15.0)
Immature Granulocytes: 2 %
Lymphocytes Relative: 6 %
Lymphs Abs: 0.9 10*3/uL (ref 0.7–4.0)
MCH: UNDETERMINED pg (ref 26.0–34.0)
MCHC: UNDETERMINED g/dL (ref 30.0–36.0)
MCV: UNDETERMINED fL (ref 80.0–100.0)
Monocytes Absolute: 1.9 10*3/uL — ABNORMAL HIGH (ref 0.1–1.0)
Monocytes Relative: 14 %
Neutro Abs: 10.5 10*3/uL — ABNORMAL HIGH (ref 1.7–7.7)
Neutrophils Relative %: 78 %
Platelets: 122 10*3/uL — ABNORMAL LOW (ref 150–400)
RBC: UNDETERMINED MIL/uL (ref 3.87–5.11)
RDW: UNDETERMINED % (ref 11.5–15.5)
WBC: 13.6 10*3/uL — ABNORMAL HIGH (ref 4.0–10.5)
nRBC: UNDETERMINED % (ref 0.0–0.2)

## 2020-10-18 LAB — URINALYSIS, ROUTINE W REFLEX MICROSCOPIC
Glucose, UA: 50 mg/dL — AB
Hgb urine dipstick: NEGATIVE
Ketones, ur: NEGATIVE mg/dL
Leukocytes,Ua: NEGATIVE
Nitrite: NEGATIVE
Protein, ur: 30 mg/dL — AB
Specific Gravity, Urine: 1.011 (ref 1.005–1.030)
pH: 5 (ref 5.0–8.0)

## 2020-10-18 LAB — COMPREHENSIVE METABOLIC PANEL WITH GFR
ALT: 57 U/L — ABNORMAL HIGH (ref 0–44)
AST: 106 U/L — ABNORMAL HIGH (ref 15–41)
Albumin: 2.3 g/dL — ABNORMAL LOW (ref 3.5–5.0)
Alkaline Phosphatase: 143 U/L — ABNORMAL HIGH (ref 38–126)
Anion gap: 13 (ref 5–15)
BUN: 25 mg/dL — ABNORMAL HIGH (ref 6–20)
CO2: 24 mmol/L (ref 22–32)
Calcium: 7.9 mg/dL — ABNORMAL LOW (ref 8.9–10.3)
Chloride: 92 mmol/L — ABNORMAL LOW (ref 98–111)
Creatinine, Ser: 1.84 mg/dL — ABNORMAL HIGH (ref 0.44–1.00)
GFR, Estimated: 31 mL/min — ABNORMAL LOW
Glucose, Bld: 110 mg/dL — ABNORMAL HIGH (ref 70–99)
Potassium: 2 mmol/L — CL (ref 3.5–5.1)
Sodium: 129 mmol/L — ABNORMAL LOW (ref 135–145)
Total Bilirubin: 28.1 mg/dL (ref 0.3–1.2)
Total Protein: 7.2 g/dL (ref 6.5–8.1)

## 2020-10-18 LAB — PROTIME-INR
INR: 2.4 — ABNORMAL HIGH (ref 0.8–1.2)
Prothrombin Time: 25.8 s — ABNORMAL HIGH (ref 11.4–15.2)

## 2020-10-18 LAB — LIPASE, BLOOD: Lipase: 21 U/L (ref 11–51)

## 2020-10-18 LAB — ETHANOL: Alcohol, Ethyl (B): <10 mg/dL

## 2020-10-18 LAB — BILIRUBIN, DIRECT: Bilirubin, Direct: 11.9 mg/dL — ABNORMAL HIGH (ref 0.0–0.2)

## 2020-10-18 LAB — AMMONIA: Ammonia: 160 umol/L — ABNORMAL HIGH (ref 9–35)

## 2020-10-18 MED ORDER — LORAZEPAM 1 MG PO TABS: 1.0000 mg | ORAL_TABLET | ORAL | Status: AC | PRN

## 2020-10-18 MED ORDER — LORAZEPAM 2 MG/ML IJ SOLN: 1.0000 mg | INTRAMUSCULAR | Status: AC | PRN

## 2020-10-18 MED ORDER — SODIUM CHLORIDE 0.9 % IV BOLUS
1000.0000 mL | Freq: Once | INTRAVENOUS | Status: AC
  Administered 2020-10-18: 1000 mL via INTRAVENOUS

## 2020-10-18 MED ORDER — LACTULOSE 10 GM/15ML PO SOLN
10.0000 g | Freq: Two times a day (BID) | ORAL | Status: DC
  Administered 2020-10-19 – 2020-10-22 (×8): 10 g via ORAL
  Filled 2020-10-18 (×8): qty 15

## 2020-10-18 MED ORDER — POTASSIUM CHLORIDE 10 MEQ/100ML IV SOLN
10.0000 meq | INTRAVENOUS | Status: AC
  Administered 2020-10-18 – 2020-10-19 (×5): 10 meq via INTRAVENOUS
  Filled 2020-10-18 (×5): qty 100

## 2020-10-18 MED ORDER — LACTULOSE 10 GM/15ML PO SOLN
30.0000 g | Freq: Once | ORAL | Status: DC
  Filled 2020-10-18: qty 60

## 2020-10-18 MED ORDER — FOLIC ACID 1 MG PO TABS
1.0000 mg | ORAL_TABLET | Freq: Every day | ORAL | Status: DC
  Administered 2020-10-19 – 2020-10-27 (×9): 1 mg via ORAL
  Filled 2020-10-18 (×9): qty 1

## 2020-10-18 MED ORDER — ADULT MULTIVITAMIN W/MINERALS CH
1.0000 | ORAL_TABLET | Freq: Every day | ORAL | Status: DC
  Administered 2020-10-19 – 2020-10-27 (×9): 1 via ORAL
  Filled 2020-10-18 (×9): qty 1

## 2020-10-18 MED ORDER — SODIUM CHLORIDE 0.9 % IV SOLN: INTRAVENOUS | Status: DC

## 2020-10-18 MED ORDER — THIAMINE HCL 100 MG PO TABS
100.0000 mg | ORAL_TABLET | Freq: Every day | ORAL | Status: DC
  Administered 2020-10-19 – 2020-10-27 (×9): 100 mg via ORAL
  Filled 2020-10-18 (×9): qty 1

## 2020-10-18 MED ORDER — LACTULOSE ENEMA
300.0000 mL | Freq: Once | ORAL | Status: AC
  Administered 2020-10-19: 300 mL via RECTAL
  Filled 2020-10-18: qty 300

## 2020-10-18 MED ORDER — ONDANSETRON HCL 4 MG/2ML IJ SOLN
4.0000 mg | Freq: Once | INTRAMUSCULAR | Status: AC
  Administered 2020-10-18: 4 mg via INTRAVENOUS
  Filled 2020-10-18: qty 2

## 2020-10-18 MED ORDER — THIAMINE HCL 100 MG/ML IJ SOLN
100.0000 mg | Freq: Every day | INTRAMUSCULAR | Status: DC
  Filled 2020-10-18 (×3): qty 2

## 2020-10-18 NOTE — ED Notes (Signed)
Family updated as to patient's status. Daughter.

## 2020-10-18 NOTE — H&P (Incomplete)
History and Physical    Lori Oconnell [id number] DOB: March 20, 1961 DOA: 10/18/2020  PCP: [name], MD  Patient coming from: Home, husband at bedside  I have personally briefly reviewed patient's old medical records in [facility]  Chief Complaint: AMS  HPI: Lori Oconnell is a 60 y.o. female with medical history significant for heavy alcohol abuse, HTN, HLD who presents due to family concern for altered mental status.  Patient alert and oriented only to self and place.  Husband at bedside provide history.  Patient has been drinking heavily for years and recently have gotten worse since her adult son was recently put back into prison.  Around Easter, she left her husband and went to stay with her daughter and reportedly has been drinking nonstop.  Husband thinks she drinks at least a pint of liquor a day.  Today her granddaughter found her on the floor when she went to school and when she came back patient was still on the floor after 8 hours.  Patient denies any pain or discomfort but otherwise unable to provide any history.  ED Course: Was afebrile and normotensive on room air.  WBC of 13.6, hemoglobin of 9.7.  Sodium of 129.  Potassium of 2.  Creatinine of 1.84.  BG of 110.  AST of 106, ALT 57, total bilirubin of 28.  INR of 2.4.  Ammonia level of 160.  Patient was given lactulose, 1 L of normal saline fluid with a CT abdomen pending and hospitalist was called for admission.  Review of Systems: Unable to obtain given patient's altered mental status  Past Medical History:  Diagnosis Date  . Anxiety   . [place] DISEASE, CERVICAL 07/13/2009  . Edema 03/22/2011  . HEMATOCHEZIA 11/30/2009  . HYPERLIPIDEMIA 07/18/2009  . HYPERTENSION 07/12/2009  . Irregular menses 03/22/2011  . NECK PAIN 07/12/2009  . OVERACTIVE BLADDER 07/12/2009  . RENAL INSUFFICIENCY 07/12/2009  . SINUSITIS- ACUTE-NOS 07/12/2009  . Sleep apnea    prescribed cpap but has not gotten  . TRANSAMINASES, SERUM, ELEVATED  07/12/2009    Past Surgical History:  Procedure Laterality Date  . MASS EXCISION N/A 08/27/2012   Procedure: EXCISION MASS UPPER BACK;  Surgeon: Joyice Faster. Cornett, MD;  Location: [facility];  Service: General;  Laterality: N/A;  . s/p ganglion cyst Right 1980  . TUBAL LIGATION  1990     reports that she has been smoking cigarettes. She has a 1.25 pack-year smoking history. She has quit using smokeless tobacco.  Her smokeless tobacco use included chew. She reports current alcohol use of about 3.0 standard drinks of alcohol per week. She reports that she does not use drugs. Social History  No Known Allergies  Family History  Problem Relation Age of Onset  . Hypertension Mother   . Diabetes Maternal Aunt   . Colon cancer Neg Hx      Prior to Admission medications   Medication Sig Start Date End Date Taking? Authorizing Provider  amLODipine (NORVASC) 5 MG tablet Take 1 tablet (5 mg total) by mouth daily. Must keep scheduled appt for future refills 09/03/20   [name], MD  aspirin 81 MG tablet Take 81 mg by mouth daily.    [provider]  azelastine (OPTIVAR) 0.05 % ophthalmic solution Place 1 drop into both eyes 2 (two) times daily. Patient not taking: No sig reported 06/12/19   [name], MD  escitalopram (LEXAPRO) 10 MG tablet Take 1 tablet (10 mg total) by mouth daily.  Must keep scheduled appt for future refills 09/03/20   [name], MD  naproxen (NAPROSYN) 500 MG tablet Take 1 tablet (500 mg total) by mouth 2 (two) times daily with a meal. Patient not taking: No sig reported 05/09/17   [name], MD  triamcinolone (NASACORT) 55 MCG/ACT AERO nasal inhaler Place 2 sprays into the nose daily. Patient not taking: No sig reported 06/12/19   [name], MD  Vitamin D, Ergocalciferol, (DRISDOL) 1.25 MG (50000 UT) CAPS capsule Take 1 capsule (50,000 Units total) by mouth every 7 (seven) days. 06/15/19   [name], MD    Physical Exam: Vitals:   10/18/20 2035  10/18/20 2047 10/18/20 2150 10/18/20 2303  BP:  109/66 135/74 110/63  Pulse:  100 91 93  Resp:  18 19 16   Temp:  97.9 F (36.6 C)    TempSrc:  Oral    SpO2:  100% 100% 100%  Weight: 80 kg     Height: 5\' 6"  (1.676 m)       Constitutional: NAD, calm, comfortable Vitals:   10/18/20 2035 10/18/20 2047 10/18/20 2150 10/18/20 2303  BP:  109/66 135/74 110/63  Pulse:  100 91 93  Resp:  18 19 16   Temp:  97.9 F (36.6 C)    TempSrc:  Oral    SpO2:  100% 100% 100%  Weight: 80 kg     Height: 5\' 6"  (1.676 m)      Eyes: PERRL, lids and conjunctivae normal ENMT: Mucous membranes are moist. Posterior pharynx clear of any exudate or lesions.Normal dentition.  Neck: normal, supple, no masses, no thyromegaly Respiratory: clear to auscultation bilaterally, no wheezing, no crackles. Normal respiratory effort. No accessory muscle use.  Cardiovascular: Regular rate and rhythm, no murmurs / rubs / gallops. No extremity edema. 2+ pedal pulses. No carotid bruits.  Abdomen: no tenderness, no masses palpated. No hepatosplenomegaly. Bowel sounds positive.  Musculoskeletal: no clubbing / cyanosis. No joint deformity upper and lower extremities. Good ROM, no contractures. Normal muscle tone.  Skin: no rashes, lesions, ulcers. No induration Neurologic: CN 2-12 grossly intact. Sensation intact, DTR normal. Strength 5/5 in all 4.  Psychiatric: Normal judgment and insight. Alert and oriented x 3. Normal mood.   (Anything < 9 systems with 2 bullets each down codes to level 1) (If patient refuses exam can't bill higher level) (Make sure to document decubitus ulcers present on admission -- if possible -- and whether patient has chronic indwelling catheter at time of admission)  Labs on Admission: I have personally reviewed following labs and imaging studies  CBC: Recent Labs  Lab 10/18/20 2042  WBC 13.6*  NEUTROABS 10.5*  HGB 9.7*  HCT Unable to determine due to a cold agglutinin  MCV Unable to determine  due to a cold agglutinin  PLT 413*   Basic Metabolic Panel: Recent Labs  Lab 10/18/20 2042  NA 129*  K 2.0*  CL 92*  CO2 24  GLUCOSE 110*  BUN 25*  CREATININE 1.84*  CALCIUM 7.9*   GFR: Estimated Creatinine Clearance: 35.6 mL/min (A) (by C-G formula based on SCr of 1.84 mg/dL (H)). Liver Function Tests: Recent Labs  Lab 10/18/20 2042  AST 106*  ALT 57*  ALKPHOS 143*  BILITOT 28.1*  PROT 7.2  ALBUMIN 2.3*   Recent Labs  Lab 10/18/20 2054  LIPASE 21   Recent Labs  Lab 10/18/20 2055  AMMONIA 160*   Coagulation Profile: Recent Labs  Lab 10/18/20 2054  INR 2.4*   Cardiac Enzymes: No results for input(s): CKTOTAL, CKMB, CKMBINDEX, TROPONINI in the last 168 hours. BNP (last 3 results) No results for input(s): PROBNP in the last 8760 hours. HbA1C: No results for input(s): HGBA1C in the last 72 hours. CBG: No results for input(s): GLUCAP in the last 168 hours. Lipid Profile: No results for input(s): CHOL, HDL, LDLCALC, TRIG, CHOLHDL, LDLDIRECT in the last 72 hours. Thyroid Function Tests: No results for input(s): TSH, T4TOTAL, FREET4, T3FREE, THYROIDAB in the last 72 hours. Anemia Panel: No results for input(s): VITAMINB12, FOLATE, FERRITIN, TIBC, IRON, RETICCTPCT in the last 72 hours. Urine analysis:    Component Value Date/Time   COLORURINE YELLOW 10/18/2020 2150   APPEARANCEUR CLOUDY (A) 10/18/2020 2150   LABSPEC 1.011 10/18/2020 2150   PHURINE 5.0 10/18/2020 2150   GLUCOSEU 50 (A) 10/18/2020 2150   GLUCOSEU NEGATIVE 06/12/2019 1232   HGBUR NEGATIVE 10/18/2020 2150   BILIRUBINUR MODERATE (A) 10/18/2020 2150   BILIRUBINUR neg 11/26/2014 1359   KETONESUR NEGATIVE 10/18/2020 2150   PROTEINUR 30 (A) 10/18/2020 2150   UROBILINOGEN 1.0 06/12/2019 1232   NITRITE NEGATIVE 10/18/2020 2150   LEUKOCYTESUR NEGATIVE 10/18/2020 2150    Radiological Exams on Admission: CT ABDOMEN PELVIS WO CONTRAST  Result Date: 10/18/2020 CLINICAL DATA:  60 year old female  with abdominal distension. EXAM: CT ABDOMEN AND PELVIS WITHOUT CONTRAST TECHNIQUE: Multidetector CT imaging of the abdomen and pelvis was performed following the standard protocol without IV contrast. COMPARISON:  Ultrasound dated 06/21/2018. FINDINGS: Evaluation of this exam is limited in the absence of intravenous contrast. Lower chest: The visualized lung bases are clear. No intra-abdominal free air.  Small ascites. Hepatobiliary: Severe fatty liver. There is irregularity of the liver contour suggestive of cirrhosis. Clinical correlation is recommended. Layering stones noted in the gallbladder. Pancreas: The pancreas is atrophic.  No active inflammatory changes. Spleen: Normal in size without focal abnormality. Adrenals/Urinary Tract: The adrenal glands unremarkable. The kidneys, visualized ureters, and urinary bladder appear unremarkable. Stomach/Bowel: There is no bowel obstruction or active inflammation. No CT findings of acute appendicitis. Vascular/Lymphatic: The abdominal aorta and IVC unremarkable. No portal venous gas. There is no adenopathy. Reproductive: The uterus and ovaries are grossly unremarkable. Other: There is diffuse mesenteric edema and small ascites. Musculoskeletal: Degenerative changes of the spine. No acute osseous pathology. IMPRESSION: 1. Severe fatty liver with findings of cirrhosis. Clinical correlation is recommended. 2. Cholelithiasis. 3. No bowel obstruction. 4. Small ascites. Electronically Signed   By: Anner Crete M.D.   On: 10/18/2020 23:37   CT Head Wo Contrast  Result Date: 10/18/2020 CLINICAL DATA:  Generalized weakness and vomiting. EXAM: CT HEAD WITHOUT CONTRAST TECHNIQUE: Contiguous axial images were obtained from the base of the skull through the vertex without intravenous contrast. COMPARISON:  None. FINDINGS: Brain: No evidence of acute infarction, hemorrhage, hydrocephalus, extra-axial collection or mass lesion/mass effect. Vascular: No hyperdense vessel or  unexpected calcification. Skull: Normal. Negative for fracture or focal lesion. Sinuses/Orbits: No acute finding. Other: None. IMPRESSION: No acute intracranial abnormality. Electronically Signed   By: Virgina Norfolk M.D.   On: 10/18/2020 21:29   DG Chest Port 1 View  Result Date: 10/18/2020 CLINICAL DATA:  Generalized weakness. EXAM: PORTABLE CHEST 1 VIEW COMPARISON:  August 21, 2012 FINDINGS: The heart size and mediastinal contours are within normal limits. Both lungs are clear. The visualized skeletal structures are unremarkable. IMPRESSION: No active disease. Electronically Signed   By: Virgina Norfolk M.D.   On: 10/18/2020 21:11  Assessment/Plan Active Problems:   Hepatic encephalopathy ([place])  (please populate well all problems here in Problem List. (For example, if patient is on BP meds at home and you resume or decide to hold them, it is a problem that needs to be her. Same for CAD, COPD, HLD and so on)   ***  DVT prophylaxis: *** (Lovenox/Heparin/SCD's/anticoagulated/None (if comfort care) Code Status: *** (Full/Partial (specify details) Family Communication: *** (Specify name, relationship. Do not write "discussed with patient". Specify tel # if discussed over the phone) Disposition Plan: *** (specify when and where you expect patient to be discharged) Consults called: *** (with names) Admission status: *** (inpatient / obs / tele / medical floor / SDU)  Level of care: Stepdown  Status is: Inpatient  {Inpatient:23812}  Dispo: The patient is from: {From:23814}              Anticipated d/c is to: {To:23815}              Patient currently {Medically stable:23817}   Difficult to place patient {Yes/No:25151}         Orene Desanctis DO [facility]   If 7PM-7AM, please contact night-coverage [email or web address]   10/18/2020, 11:55 PM

## 2020-10-18 NOTE — H&P (Addendum)
History and Physical    Lori Oconnell UEA:[id number] DOB: 1960-09-03 DOA: 10/18/2020  PCP: [name], MD  Patient coming from: Home, husband at bedside  I have personally briefly reviewed patient's old medical records in [place]  Chief Complaint: AMS  HPI: Lori Oconnell is a 60 y.o. female with medical history significant for heavy alcohol abuse, HTN, HLD who presents due to family concern for altered mental status.  Patient alert and oriented only to self and place.  Husband at bedside provide history.  Patient has been drinking heavily for years and recently have gotten worse since her adult son was recently put back into prison.  Around Easter, she left her husband and went to stay with her daughter and reportedly has been drinking nonstop.  Husband thinks she drinks at least a pint of liquor a day.  Today her granddaughter found her on the floor when she went to school and when she came back patient was still on the floor after 8 hours.  Patient denies any pain or discomfort but otherwise unable to provide any history.  ED Course: Was afebrile and normotensive on room air.  WBC of 13.6, hemoglobin of 9.7.  Sodium of 129.  Potassium of 2.  Creatinine of 1.84.  BG of 110.  AST of 106, ALT 57, total bilirubin of 28.  INR of 2.4.  Ammonia level of 160.  Patient was given lactulose, 1 L of normal saline fluid with a CT abdomen pending and hospitalist was called for admission.  Review of Systems: Unable to obtain given patient's altered mental status  Past Medical History:  Diagnosis Date  . Anxiety   . [place] DISEASE, CERVICAL 07/13/2009  . Edema 03/22/2011  . HEMATOCHEZIA 11/30/2009  . HYPERLIPIDEMIA 07/18/2009  . HYPERTENSION 07/12/2009  . Irregular menses 03/22/2011  . NECK PAIN 07/12/2009  . OVERACTIVE BLADDER 07/12/2009  . RENAL INSUFFICIENCY 07/12/2009  . SINUSITIS- ACUTE-NOS 07/12/2009  . Sleep apnea    prescribed cpap but has not gotten  . TRANSAMINASES, SERUM, ELEVATED  07/12/2009    Past Surgical History:  Procedure Laterality Date  . MASS EXCISION N/A 08/27/2012   Procedure: EXCISION MASS UPPER BACK;  Surgeon: Joyice Faster. Cornett, MD;  Location: [facility];  Service: General;  Laterality: N/A;  . s/p ganglion cyst Right 1980  . TUBAL LIGATION  1990     reports that she has been smoking cigarettes. She has a 1.25 pack-year smoking history. She has quit using smokeless tobacco.  Her smokeless tobacco use included chew. She reports current alcohol use of about 3.0 standard drinks of alcohol per week. She reports that she does not use drugs. Social History  No Known Allergies  Family History  Problem Relation Age of Onset  . Hypertension Mother   . Diabetes Maternal Aunt   . Colon cancer Neg Hx      Prior to Admission medications   Medication Sig Start Date End Date Taking? Authorizing Provider  amLODipine (NORVASC) 5 MG tablet Take 1 tablet (5 mg total) by mouth daily. Must keep scheduled appt for future refills 09/03/20   [name], MD  aspirin 81 MG tablet Take 81 mg by mouth daily.    [provider]  azelastine (OPTIVAR) 0.05 % ophthalmic solution Place 1 drop into both eyes 2 (two) times daily. Patient not taking: No sig reported 06/12/19   [name], MD  escitalopram (LEXAPRO) 10 MG tablet Take 1 tablet (10 mg total) by mouth daily.  Must keep scheduled appt for future refills 09/03/20   [name], MD  naproxen (NAPROSYN) 500 MG tablet Take 1 tablet (500 mg total) by mouth 2 (two) times daily with a meal. Patient not taking: No sig reported 05/09/17   [name], MD  triamcinolone (NASACORT) 55 MCG/ACT AERO nasal inhaler Place 2 sprays into the nose daily. Patient not taking: No sig reported 06/12/19   [name], MD  Vitamin D, Ergocalciferol, (DRISDOL) 1.25 MG (50000 UT) CAPS capsule Take 1 capsule (50,000 Units total) by mouth every 7 (seven) days. 06/15/19   [name], MD    Physical Exam: Vitals:   10/18/20 2035  10/18/20 2047 10/18/20 2150 10/18/20 2303  BP:  109/66 135/74 110/63  Pulse:  100 91 93  Resp:  18 19 16   Temp:  97.9 F (36.6 C)    TempSrc:  Oral    SpO2:  100% 100% 100%  Weight: 80 kg     Height: 5\' 6"  (1.676 m)       Constitutional: NAD,obese female laying at 45 degree in bed with eyes closed  Vitals:   10/18/20 2035 10/18/20 2047 10/18/20 2150 10/18/20 2303  BP:  109/66 135/74 110/63  Pulse:  100 91 93  Resp:  18 19 16   Temp:  97.9 F (36.6 C)    TempSrc:  Oral    SpO2:  100% 100% 100%  Weight: 80 kg     Height: 5\' 6"  (1.676 m)      Eyes: PERRL,sclera icterus ENMT: Mucous membranes are moist.  Neck: normal, supple Respiratory: clear to auscultation bilaterally, no wheezing, no crackles. Normal respiratory effort. Frequent forceful cough with salivation dripping out. no accessory muscle use.  Cardiovascular: Regular rate and rhythm, no murmurs / rubs / gallops. Bilateral ankle edema worse on the left. Abdomen: no tenderness, no masses palpated.  Bowel sounds positive.  Musculoskeletal: no clubbing / cyanosis. No joint deformity upper and lower extremities. Good ROM, no contractures. Normal muscle tone.  Skin: no rashes, lesions, ulcers. No induration Neurologic: Pt oriented only to self and place. Otherwise keeps repeating 1968 to any questions. Mostly has eyes closed.  Psychiatric: Disoriented     Labs on Admission: I have personally reviewed following labs and imaging studies  CBC: Recent Labs  Lab 10/18/20 2042  WBC 13.6*  NEUTROABS 10.5*  HGB 9.7*  HCT Unable to determine due to a cold agglutinin  MCV Unable to determine due to a cold agglutinin  PLT 154*   Basic Metabolic Panel: Recent Labs  Lab 10/18/20 2042  NA 129*  K 2.0*  CL 92*  CO2 24  GLUCOSE 110*  BUN 25*  CREATININE 1.84*  CALCIUM 7.9*   GFR: Estimated Creatinine Clearance: 35.6 mL/min (A) (by C-G formula based on SCr of 1.84 mg/dL (H)). Liver Function Tests: Recent Labs  Lab  10/18/20 2042  AST 106*  ALT 57*  ALKPHOS 143*  BILITOT 28.1*  PROT 7.2  ALBUMIN 2.3*   Recent Labs  Lab 10/18/20 2054  LIPASE 21   Recent Labs  Lab 10/18/20 2055  AMMONIA 160*   Coagulation Profile: Recent Labs  Lab 10/18/20 2054  INR 2.4*   Cardiac Enzymes: No results for input(s): CKTOTAL, CKMB, CKMBINDEX, TROPONINI in the last 168 hours. BNP (last 3 results) No results for input(s): PROBNP in the last 8760 hours. HbA1C: No results for input(s): HGBA1C in the last 72 hours. CBG: No results for input(s): GLUCAP in the last  168 hours. Lipid Profile: No results for input(s): CHOL, HDL, LDLCALC, TRIG, CHOLHDL, LDLDIRECT in the last 72 hours. Thyroid Function Tests: No results for input(s): TSH, T4TOTAL, FREET4, T3FREE, THYROIDAB in the last 72 hours. Anemia Panel: No results for input(s): VITAMINB12, FOLATE, FERRITIN, TIBC, IRON, RETICCTPCT in the last 72 hours. Urine analysis:    Component Value Date/Time   COLORURINE YELLOW 10/18/2020 2150   APPEARANCEUR CLOUDY (A) 10/18/2020 2150   LABSPEC 1.011 10/18/2020 2150   PHURINE 5.0 10/18/2020 2150   GLUCOSEU 50 (A) 10/18/2020 2150   GLUCOSEU NEGATIVE 06/12/2019 1232   HGBUR NEGATIVE 10/18/2020 2150   BILIRUBINUR MODERATE (A) 10/18/2020 2150   BILIRUBINUR neg 11/26/2014 1359   KETONESUR NEGATIVE 10/18/2020 2150   PROTEINUR 30 (A) 10/18/2020 2150   UROBILINOGEN 1.0 06/12/2019 1232   NITRITE NEGATIVE 10/18/2020 2150   LEUKOCYTESUR NEGATIVE 10/18/2020 2150    Radiological Exams on Admission: CT ABDOMEN PELVIS WO CONTRAST  Result Date: 10/18/2020 CLINICAL DATA:  60 year old female with abdominal distension. EXAM: CT ABDOMEN AND PELVIS WITHOUT CONTRAST TECHNIQUE: Multidetector CT imaging of the abdomen and pelvis was performed following the standard protocol without IV contrast. COMPARISON:  Ultrasound dated 06/21/2018. FINDINGS: Evaluation of this exam is limited in the absence of intravenous contrast. Lower chest:  The visualized lung bases are clear. No intra-abdominal free air.  Small ascites. Hepatobiliary: Severe fatty liver. There is irregularity of the liver contour suggestive of cirrhosis. Clinical correlation is recommended. Layering stones noted in the gallbladder. Pancreas: The pancreas is atrophic.  No active inflammatory changes. Spleen: Normal in size without focal abnormality. Adrenals/Urinary Tract: The adrenal glands unremarkable. The kidneys, visualized ureters, and urinary bladder appear unremarkable. Stomach/Bowel: There is no bowel obstruction or active inflammation. No CT findings of acute appendicitis. Vascular/Lymphatic: The abdominal aorta and IVC unremarkable. No portal venous gas. There is no adenopathy. Reproductive: The uterus and ovaries are grossly unremarkable. Other: There is diffuse mesenteric edema and small ascites. Musculoskeletal: Degenerative changes of the spine. No acute osseous pathology. IMPRESSION: 1. Severe fatty liver with findings of cirrhosis. Clinical correlation is recommended. 2. Cholelithiasis. 3. No bowel obstruction. 4. Small ascites. Electronically Signed   By: Anner Crete M.D.   On: 10/18/2020 23:37   CT Head Wo Contrast  Result Date: 10/18/2020 CLINICAL DATA:  Generalized weakness and vomiting. EXAM: CT HEAD WITHOUT CONTRAST TECHNIQUE: Contiguous axial images were obtained from the base of the skull through the vertex without intravenous contrast. COMPARISON:  None. FINDINGS: Brain: No evidence of acute infarction, hemorrhage, hydrocephalus, extra-axial collection or mass lesion/mass effect. Vascular: No hyperdense vessel or unexpected calcification. Skull: Normal. Negative for fracture or focal lesion. Sinuses/Orbits: No acute finding. Other: None. IMPRESSION: No acute intracranial abnormality. Electronically Signed   By: Virgina Norfolk M.D.   On: 10/18/2020 21:29   DG Chest Port 1 View  Result Date: 10/18/2020 CLINICAL DATA:  Generalized weakness. EXAM:  PORTABLE CHEST 1 VIEW COMPARISON:  August 21, 2012 FINDINGS: The heart size and mediastinal contours are within normal limits. Both lungs are clear. The visualized skeletal structures are unremarkable. IMPRESSION: No active disease. Electronically Signed   By: Virgina Norfolk M.D.   On: 10/18/2020 21:11      Assessment/Plan  Acute hepatic encephalopathy secondary to alcohol abuse -Ammonia level of 160 - BID close with goal of 3 bowel movements daily -Follow mentation clinically - pt also found down after 8 hrs- check CK level  Acute liver failure/severe cirrhosis -Patient noted to have transaminitis, hyperbilirubinemia, supratherapeutic INR  from heavy alcohol use -Needs GI consult -Placed on CIWA protocol   Hyperbilirubinemia -Total bili of 28-secondary to heavy alcohol use -No obstruction noted on CT abdomen  Anemia/supratherapeutic INR -Hgb of 9.7 -In the setting of supratherapeutic INR.  No obvious source of bleeding. -Obtain FOBT, iron panel, vitamin B12 and folate, LDH  Hypokalemia -Repleted with IV potassium  Hyponatremia -Na of 129 from dehydration and alcohol use -Has received  1L NS in ED - slow IV 50cc/hr to avoid rapid correction  -BMP q4hr   AKI -likely pre-renal - follow Cr with IV fluid resuscitation   DVT prophylaxis:.SCDs Code Status: Full Family Communication: Plan discussed with husband at bedside  disposition Plan: Home with at least 2 midnight stays  Consults called:  Admission status: inpatient   Level of care: Stepdown  Status is: Inpatient  Remains inpatient appropriate because:Inpatient level of care appropriate due to severity of illness   Dispo: The patient is from: Home              Anticipated d/c is to: Home              Patient currently is not medically stable to d/c.   Difficult to place patient No         Orene Desanctis DO [facility]   If 7PM-7AM, please contact night-coverage [email or web address]   10/18/2020,  11:55 PM

## 2020-10-18 NOTE — ED Triage Notes (Signed)
Pt arrives EMS from home with c/o generalized weakness, vomiting, diarrhea, frequent urination and alcohol intoxication. Pt has been experiencing sx since Thursday.

## 2020-10-18 NOTE — ED Provider Notes (Signed)
[facility] DEPT Provider Note   CSN: [id number] Arrival date & time: 10/18/20  2024     History Chief Complaint  Patient presents with  . Altered Mental Status    Lori Oconnell is a 60 y.o. female hx of HTN, HL, here with altered mental status and confusion.  Patient is a chronic alcoholic.  Patient drinks alcohol daily for the last several years.  Patient apparently left her husband during [place] weekend and stayed with her daughter.  Apparently she has been drinking nonstop since then. Her granddaughter called patient's husband that patient is becoming very altered and not arousable and cannot even get up and walk.  Patient's husband came to the house and noticed that she is very lethargic.  EMS was called.  Patient is unable to give any history.  The history is provided by the spouse and the EMS personnel.  Level V caveat- AMS      Past Medical History:  Diagnosis Date  . Anxiety   . [place] DISEASE, CERVICAL 07/13/2009  . Edema 03/22/2011  . HEMATOCHEZIA 11/30/2009  . HYPERLIPIDEMIA 07/18/2009  . HYPERTENSION 07/12/2009  . Irregular menses 03/22/2011  . NECK PAIN 07/12/2009  . OVERACTIVE BLADDER 07/12/2009  . RENAL INSUFFICIENCY 07/12/2009  . SINUSITIS- ACUTE-NOS 07/12/2009  . Sleep apnea    prescribed cpap but has not gotten  . TRANSAMINASES, SERUM, ELEVATED 07/12/2009    Patient Active Problem List   Diagnosis Date Noted  . Allergic rhinitis 06/12/2019  . Allergic conjunctivitis 06/12/2019  . Abnormal LFTs 06/12/2019  . Poor dentition 05/15/2018  . Left ankle swelling 11/04/2015  . Rash 11/26/2014  . Hot flashes 07/02/2013  . Anemia, unspecified 12/10/2012  . 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 Depression with anxiety 11/09/2012  . 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 Mass on back 08/13/2012  . Lumbar radicular pain 10/08/2011  . Lipoma of neck 10/08/2011  . Neck pain 03/22/2011  . Edema 03/22/2011  . Irregular menses 03/22/2011  .  Encounter for well adult exam with abnormal findings 03/19/2011  . HEMATOCHEZIA 11/30/2009  . Hyperlipidemia 07/18/2009  . [place] DISEASE, CERVICAL 07/13/2009  . Essential hypertension 07/12/2009  . RENAL INSUFFICIENCY 07/12/2009  . OVERACTIVE BLADDER 07/12/2009  . NECK PAIN 07/12/2009  . TRANSAMINASES, SERUM, ELEVATED 07/12/2009    Past Surgical History:  Procedure Laterality Date  . MASS EXCISION N/A 08/27/2012   Procedure: EXCISION MASS UPPER BACK;  Surgeon: Joyice Faster. Cornett, MD;  Location: [facility];  Service: General;  Laterality: N/A;  . s/p ganglion cyst Right 1980  . TUBAL LIGATION  1990     OB History   No obstetric history on file.     Family History  Problem Relation Age of Onset  . Hypertension Mother   . Diabetes Maternal Aunt   . Colon cancer Neg Hx     Social History   Tobacco Use  . Smoking status: Current Some Day Smoker    Packs/day: 0.25    Years: 5.00    Pack years: 1.25    Types: Cigarettes  . Smokeless tobacco: Former Systems developer    Types: Chew  . Tobacco comment: smokes 2 to 3 cigarettes a day  Vaping Use  . Vaping Use: Never used  Substance Use Topics  . Alcohol use: Yes    Alcohol/week: 3.0 standard drinks    Types: 3 Standard drinks or equivalent per week    Comment: wine, beer weekly   . Drug use: No    Home Medications  Prior to Admission medications   Medication Sig Start Date End Date Taking? Authorizing Provider  amLODipine (NORVASC) 5 MG tablet Take 1 tablet (5 mg total) by mouth daily. Must keep scheduled appt for future refills 09/03/20   [name], MD  aspirin 81 MG tablet Take 81 mg by mouth daily.    [provider]  azelastine (OPTIVAR) 0.05 % ophthalmic solution Place 1 drop into both eyes 2 (two) times daily. Patient not taking: No sig reported 06/12/19   [name], MD  escitalopram (LEXAPRO) 10 MG tablet Take 1 tablet (10 mg total) by mouth daily. Must keep scheduled appt for future refills 09/03/20   [name],  MD  naproxen (NAPROSYN) 500 MG tablet Take 1 tablet (500 mg total) by mouth 2 (two) times daily with a meal. Patient not taking: No sig reported 05/09/17   [name], MD  triamcinolone (NASACORT) 55 MCG/ACT AERO nasal inhaler Place 2 sprays into the nose daily. Patient not taking: No sig reported 06/12/19   [name], MD  Vitamin D, Ergocalciferol, (DRISDOL) 1.25 MG (50000 UT) CAPS capsule Take 1 capsule (50,000 Units total) by mouth every 7 (seven) days. 06/15/19   [name], MD    Allergies    Patient has no known allergies.  Review of Systems   Review of Systems  Psychiatric/Behavioral: Positive for confusion.  All other systems reviewed and are negative.   Physical Exam Updated Vital Signs BP 135/74   Pulse 91   Temp 97.9 F (36.6 C) (Oral)   Resp 19   Ht 5\' 6"  (1.676 m)   Wt 80 kg   LMP 03/19/2011   SpO2 100%   BMI 28.47 kg/m   Physical Exam Vitals and nursing note reviewed.  Constitutional:      Comments: Confused and altered and unable to answer questions  HENT:     Head: Normocephalic.     Mouth/Throat:     Mouth: Mucous membranes are dry.  Eyes:     Comments: Scleral icterus  Cardiovascular:     Rate and Rhythm: Normal rate and regular rhythm.     Pulses: Normal pulses.     Heart sounds: Normal heart sounds.  Pulmonary:     Effort: Pulmonary effort is normal.     Breath sounds: Normal breath sounds.  Abdominal:     General: Abdomen is flat.     Comments: Liver is enlarged but nontender.  Musculoskeletal:     Cervical back: Normal range of motion.  Skin:    General: Skin is warm.     Capillary Refill: Capillary refill takes less than 2 seconds.  Neurological:     Comments: Patient is confused and unable to even follow commands but spontaneously moving all extremities.  Psychiatric:     Comments: Unable      ED Results / Procedures / Treatments   Labs (all labs ordered are listed, but only abnormal results are displayed) Labs Reviewed   CBC WITH DIFFERENTIAL/PLATELET - Abnormal; Notable for the following components:      Result Value   WBC 13.6 (*)    Hemoglobin 9.7 (*)    Platelets 122 (*)    Neutro Abs 10.5 (*)    Monocytes Absolute 1.9 (*)    Abs Immature Granulocytes 0.27 (*)    All other components within normal limits  COMPREHENSIVE METABOLIC PANEL - Abnormal; Notable for the following components:   Sodium 129 (*)    Potassium  2.0 (*)    Chloride 92 (*)    Glucose, Bld 110 (*)    BUN 25 (*)    Creatinine, Ser 1.84 (*)    Calcium 7.9 (*)    Albumin 2.3 (*)    AST 106 (*)    ALT 57 (*)    Alkaline Phosphatase 143 (*)    Total Bilirubin 28.1 (*)    GFR, Estimated 31 (*)    All other components within normal limits  URINALYSIS, ROUTINE W REFLEX MICROSCOPIC - Abnormal; Notable for the following components:   APPearance CLOUDY (*)    Glucose, UA 50 (*)    Bilirubin Urine MODERATE (*)    Protein, ur 30 (*)    Bacteria, UA MANY (*)    All other components within normal limits  AMMONIA - Abnormal; Notable for the following components:   Ammonia 160 (*)    All other components within normal limits  PROTIME-INR - Abnormal; Notable for the following components:   Prothrombin Time 25.8 (*)    INR 2.4 (*)    All other components within normal limits  RESP PANEL BY RT-PCR (FLU A&B, COVID) ARPGX2  ETHANOL  LIPASE, BLOOD  BILIRUBIN, DIRECT  HEPATITIS PANEL, ACUTE    EKG EKG Interpretation  Date/Time:  Monday Oct 18 2020 20:59:21 EDT Ventricular Rate:  98 PR Interval:  151 QRS Duration: 68 QT Interval:  473 QTC Calculation: 605 R Axis:   33 Text Interpretation: Sinus rhythm Low voltage, precordial leads Borderline T abnormalities, anterior leads Prolonged QT interval poor baseline, QT prolonged Confirmed by Wandra Arthurs [phone]) on 10/18/2020 10:02:33 PM   Radiology CT Head Wo Contrast  Result Date: 10/18/2020 CLINICAL DATA:  Generalized weakness and vomiting. EXAM: CT HEAD WITHOUT CONTRAST TECHNIQUE:  Contiguous axial images were obtained from the base of the skull through the vertex without intravenous contrast. COMPARISON:  None. FINDINGS: Brain: No evidence of acute infarction, hemorrhage, hydrocephalus, extra-axial collection or mass lesion/mass effect. Vascular: No hyperdense vessel or unexpected calcification. Skull: Normal. Negative for fracture or focal lesion. Sinuses/Orbits: No acute finding. Other: None. IMPRESSION: No acute intracranial abnormality. Electronically Signed   By: Virgina Norfolk M.D.   On: 10/18/2020 21:29   DG Chest Port 1 View  Result Date: 10/18/2020 CLINICAL DATA:  Generalized weakness. EXAM: PORTABLE CHEST 1 VIEW COMPARISON:  August 21, 2012 FINDINGS: The heart size and mediastinal contours are within normal limits. Both lungs are clear. The visualized skeletal structures are unremarkable. IMPRESSION: No active disease. Electronically Signed   By: Virgina Norfolk M.D.   On: 10/18/2020 21:11    Procedures Procedures    CRITICAL CARE Performed by: Wandra Arthurs   Total critical care time: 30  minutes  Critical care time was exclusive of separately billable procedures and treating other patients.  Critical care was necessary to treat or prevent imminent or life-threatening deterioration.  Critical care was time spent personally by me on the following activities: development of treatment plan with patient and/or surrogate as well as nursing, discussions with consultants, evaluation of patient's response to treatment, examination of patient, obtaining history from patient or surrogate, ordering and performing treatments and interventions, ordering and review of laboratory studies, ordering and review of radiographic studies, pulse oximetry and re-evaluation of patient's condition.   Medications Ordered in ED Medications  lactulose (CHRONULAC) 10 GM/15ML solution 30 g (has no administration in time range)  potassium chloride 10 mEq in 100 mL IVPB (has no  administration in time range)  sodium chloride 0.9 % bolus 1,000 mL (1,000 mLs Intravenous Bolus from Bag 10/18/20 2100)  ondansetron (ZOFRAN) injection 4 mg (4 mg Intravenous Given 10/18/20 2151)    ED Course  I have reviewed the triage vital signs and the nursing notes.  Pertinent labs & imaging results that were available during my care of the patient were reviewed by me and considered in my medical decision making (see chart for details).    MDM Rules/Calculators/A&P                         Bonney Berres is a 60 y.o. female here presenting with altered mental status.  Patient is a chronic alcoholic.  Consider alcohol intoxication versus head bleed versus hepatic encephalopathy. Plan to get CT head and ammonia level and CBC and CMP and alcohol level.  10:37 PM Patient has multiple lab derangements.  Patient's ammonia level is 160.  Her bilirubin is 28.  Potassium is 2 and she has acute renal failure with creatinine is 1.8.  CT head unremarkable.  Patient was given lactulose and multiple rounds of potassium.  Patient's alcohol level is negative.  I am concerned for multisystem failure from alcohol. Husband at bedside understand the prognosis is poor.  At this point, patient will be admitted and will likely need GI consult in the morning. Hospitalist request CT ab/pel.    Final Clinical Impression(s) / ED Diagnoses Final diagnoses:  None    Rx / DC Orders ED Discharge Orders    None      Drenda Freeze, MD 10/18/20 2251

## 2020-10-19 DIAGNOSIS — E871 Hypo-osmolality and hyponatremia: Secondary | ICD-10-CM

## 2020-10-19 DIAGNOSIS — N179 Acute kidney failure, unspecified: Secondary | ICD-10-CM

## 2020-10-19 DIAGNOSIS — R188 Other ascites: Secondary | ICD-10-CM

## 2020-10-19 DIAGNOSIS — R791 Abnormal coagulation profile: Secondary | ICD-10-CM

## 2020-10-19 DIAGNOSIS — K7030 Alcoholic cirrhosis of liver without ascites: Secondary | ICD-10-CM

## 2020-10-19 LAB — COMPREHENSIVE METABOLIC PANEL
ALT: 58 U/L — ABNORMAL HIGH (ref 0–44)
AST: 103 U/L — ABNORMAL HIGH (ref 15–41)
Albumin: 2.2 g/dL — ABNORMAL LOW (ref 3.5–5.0)
Alkaline Phosphatase: 132 U/L — ABNORMAL HIGH (ref 38–126)
Anion gap: 14 (ref 5–15)
BUN: 25 mg/dL — ABNORMAL HIGH (ref 6–20)
CO2: 22 mmol/L (ref 22–32)
Calcium: 7.8 mg/dL — ABNORMAL LOW (ref 8.9–10.3)
Chloride: 95 mmol/L — ABNORMAL LOW (ref 98–111)
Creatinine, Ser: 1.69 mg/dL — ABNORMAL HIGH (ref 0.44–1.00)
GFR, Estimated: 35 mL/min — ABNORMAL LOW (ref >60)
Glucose, Bld: 109 mg/dL — ABNORMAL HIGH (ref 70–99)
Potassium: 2.3 mmol/L — CL (ref 3.5–5.1)
Sodium: 131 mmol/L — ABNORMAL LOW (ref 135–145)
Total Bilirubin: 27.4 mg/dL (ref 0.3–1.2)
Total Protein: 6.9 g/dL (ref 6.5–8.1)

## 2020-10-19 LAB — BASIC METABOLIC PANEL
Anion gap: 11 (ref 5–15)
BUN: 26 mg/dL — ABNORMAL HIGH (ref 6–20)
CO2: 23 mmol/L (ref 22–32)
Calcium: 7.6 mg/dL — ABNORMAL LOW (ref 8.9–10.3)
Chloride: 99 mmol/L (ref 98–111)
Creatinine, Ser: 1.41 mg/dL — ABNORMAL HIGH (ref 0.44–1.00)
GFR, Estimated: 43 mL/min — ABNORMAL LOW (ref >60)
Glucose, Bld: 120 mg/dL — ABNORMAL HIGH (ref 70–99)
Potassium: 2.2 mmol/L — CL (ref 3.5–5.1)
Sodium: 133 mmol/L — ABNORMAL LOW (ref 135–145)

## 2020-10-19 LAB — CBC
HCT: UNDETERMINED % (ref 36.0–46.0)
Hemoglobin: 8.7 g/dL — ABNORMAL LOW (ref 12.0–15.0)
MCH: UNDETERMINED pg (ref 26.0–34.0)
MCHC: UNDETERMINED g/dL (ref 30.0–36.0)
MCV: UNDETERMINED fL (ref 80.0–100.0)
Platelets: 136 10*3/uL — ABNORMAL LOW (ref 150–400)
RBC: UNDETERMINED MIL/uL (ref 3.87–5.11)
RDW: UNDETERMINED % (ref 11.5–15.5)
WBC: 12.8 10*3/uL — ABNORMAL HIGH (ref 4.0–10.5)
nRBC: UNDETERMINED % (ref 0.0–0.2)

## 2020-10-19 LAB — RESP PANEL BY RT-PCR (FLU A&B, COVID) ARPGX2
Influenza A by PCR: NEGATIVE
Influenza B by PCR: NEGATIVE
SARS Coronavirus 2 by RT PCR: NEGATIVE

## 2020-10-19 LAB — IRON AND TIBC: Iron: 83 ug/dL (ref 28–170)

## 2020-10-19 LAB — MAGNESIUM
Magnesium: 1 mg/dL — ABNORMAL LOW (ref 1.7–2.4)
Magnesium: 2.2 mg/dL (ref 1.7–2.4)

## 2020-10-19 LAB — FOLATE: Folate: 4.3 ng/mL — ABNORMAL LOW (ref >5.9)

## 2020-10-19 LAB — VITAMIN B12: Vitamin B-12: 3972 pg/mL — ABNORMAL HIGH (ref 180–914)

## 2020-10-19 LAB — HEPATITIS PANEL, ACUTE
HCV Ab: NONREACTIVE
Hep A IgM: NONREACTIVE
Hep B C IgM: NONREACTIVE
Hepatitis B Surface Ag: NONREACTIVE

## 2020-10-19 LAB — LACTATE DEHYDROGENASE: LDH: 407 U/L — ABNORMAL HIGH (ref 98–192)

## 2020-10-19 LAB — MRSA PCR SCREENING: MRSA by PCR: NEGATIVE

## 2020-10-19 LAB — CK: Total CK: 54 U/L (ref 38–234)

## 2020-10-19 MED ORDER — PNEUMOCOCCAL VAC POLYVALENT 25 MCG/0.5ML IJ INJ
0.5000 mL | INJECTION | INTRAMUSCULAR | Status: DC
  Filled 2020-10-19 (×2): qty 0.5

## 2020-10-19 MED ORDER — SODIUM CHLORIDE 0.9 % IV BOLUS
500.0000 mL | Freq: Once | INTRAVENOUS | Status: AC
  Administered 2020-10-19: 500 mL via INTRAVENOUS

## 2020-10-19 MED ORDER — ORAL CARE MOUTH RINSE
15.0000 mL | Freq: Two times a day (BID) | OROMUCOSAL | Status: DC
  Administered 2020-10-19 – 2020-10-27 (×16): 15 mL via OROMUCOSAL

## 2020-10-19 MED ORDER — CHLORHEXIDINE GLUCONATE 0.12 % MT SOLN
15.0000 mL | Freq: Two times a day (BID) | OROMUCOSAL | Status: DC
  Administered 2020-10-19 – 2020-10-27 (×17): 15 mL via OROMUCOSAL
  Filled 2020-10-19 (×16): qty 15

## 2020-10-19 MED ORDER — POTASSIUM CHLORIDE 10 MEQ/100ML IV SOLN
10.0000 meq | INTRAVENOUS | Status: AC
  Administered 2020-10-19 (×4): 10 meq via INTRAVENOUS
  Filled 2020-10-19 (×4): qty 100

## 2020-10-19 MED ORDER — CHLORHEXIDINE GLUCONATE CLOTH 2 % EX PADS
6.0000 | MEDICATED_PAD | Freq: Every day | CUTANEOUS | Status: DC
  Administered 2020-10-19 – 2020-10-21 (×3): 6 via TOPICAL

## 2020-10-19 MED ORDER — MAGNESIUM SULFATE 4 GM/100ML IV SOLN
4.0000 g | Freq: Once | INTRAVENOUS | Status: AC
  Administered 2020-10-19: 4 g via INTRAVENOUS
  Filled 2020-10-19: qty 100

## 2020-10-19 MED ORDER — POTASSIUM CHLORIDE CRYS ER 20 MEQ PO TBCR
40.0000 meq | EXTENDED_RELEASE_TABLET | Freq: Once | ORAL | Status: AC
  Administered 2020-10-19: 40 meq via ORAL
  Filled 2020-10-19: qty 2

## 2020-10-19 NOTE — Progress Notes (Signed)
Date and time results received: 10/19/2020 1615 Test: Potassium  Critical Value: 2.2  Name of Provider Notified: Yes, [place]  Orders Received? Or Actions Taken?: Awaiting orders

## 2020-10-19 NOTE — Progress Notes (Signed)
PROGRESS NOTE    Lori Oconnell  GMW:[id number] DOB: Nov 03, 1960 DOA: 10/18/2020 PCP: [name], MD   Brief Narrative:  Lori Oconnell is a 60 y.o. female with medical history significant for heavy alcohol abuse, HTN, HLD who presents due to family concern for altered mental status. Patient alert and oriented only to self and place.  Husband at bedside provide history.  Patient has been drinking heavily for years and recently have gotten worse since her adult son was recently put back into prison.  Around Easter, she left her husband and went to stay with her daughter and reportedly has been drinking nonstop.  Husband thinks she drinks at least a pint of liquor a day.  Today her granddaughter found her on the floor when she went to school and when she came back patient was still on the floor after 8 hours.  Assessment & Plan:   Principal Problem:   Hepatic encephalopathy (HCC) Active Problems:   Anemia, unspecified   Alcoholic cirrhosis of liver without ascites (HCC)   Supratherapeutic INR   Hyperbilirubinemia   Hyponatremia   AKI (acute kidney injury) ([place])  Acute hepatic encephalopathy secondary to alcohol abuse, minimally improving - Ammonia level of 160 - Increase lactulose until goal of 3 bowel movements daily; consider NG placement if mental status worsens; able to take PO safely today - CK WNL  Acute liver failure/severe cirrhosis - Patient noted to have transaminitis, hyperbilirubinemia, supratherapeutic INR from heavy alcohol use - follow am labs; hopefully this is a transient event/injury but concern that this could be a new baseline for patient in which case GI should get involved (could be done as outpatient). Follow repeat labs. - Placed on CIWA protocol - follow for withdrawals. Last drink likely 5/8-5/9 - Tbili of 28 at intake - No obstruction noted on CT abdomen - MELD 34(MeldNa 35)  Anemia/supratherapeutic INR - Likely chronic anemia of chronic disease/malnutrition  given above - Hgb low but stable around 9, without obvious source of bleeding - Supratherapeutic INR - 2.4 - repeat pending - FOBT pending, Iron wnl, folate low  Hypokalemia - Repleted, follow repeat labs  Hyponatremia -Na of 129 from dehydration and alcohol use -Has received  1L NS in ED - slow IV 50cc/hr to avoid rapid correction  -BMP q4hr   AKI - Likely pre-renal - Follow Cr with IV fluid resuscitation  DVT prophylaxis: SCDs Code Status:  Full Family Communication: Husband at bedside  Status is: Inpatient  Dispo: The patient is from: Home              Anticipated d/c is to: To be determined              Anticipated d/c date is: 40 to 72 hours              Patient currently not medically stable for discharge  Consultants:   None  Procedures:   None  Antimicrobials:  None  Subjective: No acute issues or events overnight, patient still somewhat somnolent and confused, oriented to person but otherwise review of systems markedly limited, patient denies any overt nausea vomiting chest pain or shortness of breath but continues to ask the same questions despite answers from myself and husband at bedside  Objective: Vitals:   10/19/20 0402 10/19/20 0445 10/19/20 0500 10/19/20 0703  BP: (!) 117/98  109/68 (!) 100/54  Pulse: 88  86 84  Resp: 14  15 13   Temp:      TempSrc:  SpO2: 100%  100% 100%  Weight:  77 kg    Height:        Intake/Output Summary (Last 24 hours) at 10/19/2020 0734 Last data filed at 10/19/2020 0734 Gross per 24 hour  Intake 839.08 ml  Output 200 ml  Net 639.08 ml   Filed Weights   10/18/20 2035 10/19/20 0445  Weight: 80 kg 77 kg    Examination:  General:  Pleasantly resting in bed, No acute distress.  Somnolent but easily arousable alert to person and general situation only HEENT:  Normocephalic atraumatic.  Sclerae icteric, noninjected.  Extraocular movements intact bilaterally. Neck:  Without mass or deformity.  Trachea is  midline. Lungs:  Clear to auscultate bilaterally without rhonchi, wheeze, or rales. Heart:  Regular rate and rhythm.  Without murmurs, rubs, or gallops. Abdomen:  Soft, nontender, minimally distended.  Without guarding or rebound. Extremities: Without cyanosis, clubbing, edema, or obvious deformity. Vascular:  Dorsalis pedis and posterior tibial pulses palpable bilaterally. Skin:  Warm and dry, no erythema, no ulcerations.   Data Reviewed: I have personally reviewed following labs and imaging studies  CBC: Recent Labs  Lab 10/18/20 2042 10/19/20 0306  WBC 13.6* 12.8*  NEUTROABS 10.5*  --   HGB 9.7* 8.7*  HCT Unable to determine due to a cold agglutinin Unable to determine due to a cold agglutinin  MCV Unable to determine due to a cold agglutinin Unable to determine due to a cold agglutinin  PLT 122* [id number]*   Basic Metabolic Panel: Recent Labs  Lab 10/18/20 2042 10/18/20 2058 10/19/20 0306  NA 129*  --  131*  K 2.0*  --  2.3*  CL 92*  --  95*  CO2 24  --  22  GLUCOSE 110*  --  109*  BUN 25*  --  25*  CREATININE 1.84*  --  1.69*  CALCIUM 7.9*  --  7.8*  MG  --  1.0*  --    GFR: Estimated Creatinine Clearance: 38 mL/min (A) (by C-G formula based on SCr of 1.69 mg/dL (H)). Liver Function Tests: Recent Labs  Lab 10/18/20 2042 10/19/20 0306  AST 106* 103*  ALT 57* 58*  ALKPHOS 143* 132*  BILITOT 28.1* 27.4*  PROT 7.2 6.9  ALBUMIN 2.3* 2.2*   Recent Labs  Lab 10/18/20 2054  LIPASE 21   Recent Labs  Lab 10/18/20 2055  AMMONIA 160*   Coagulation Profile: Recent Labs  Lab 10/18/20 2054  INR 2.4*   Cardiac Enzymes: Recent Labs  Lab 10/19/20 0306  CKTOTAL 54   BNP (last 3 results) No results for input(s): PROBNP in the last 8760 hours. HbA1C: No results for input(s): HGBA1C in the last 72 hours. CBG: No results for input(s): GLUCAP in the last 168 hours. Lipid Profile: No results for input(s): CHOL, HDL, LDLCALC, TRIG, CHOLHDL, LDLDIRECT in the last  72 hours. Thyroid Function Tests: No results for input(s): TSH, T4TOTAL, FREET4, T3FREE, THYROIDAB in the last 72 hours. Anemia Panel: No results for input(s): VITAMINB12, FOLATE, FERRITIN, TIBC, IRON, RETICCTPCT in the last 72 hours. Sepsis Labs: No results for input(s): PROCALCITON, LATICACIDVEN in the last 168 hours.  Recent Results (from the past 240 hour(s))  Resp Panel by RT-PCR (Flu A&B, Covid) Nasopharyngeal Swab     Status: None   Collection Time: 10/18/20 10:04 PM   Specimen: Nasopharyngeal Swab; Nasopharyngeal(NP) swabs in vial transport medium  Result Value Ref Range Status   SARS Coronavirus 2 by RT PCR NEGATIVE NEGATIVE Final  Comment: (NOTE) SARS-CoV-2 target nucleic acids are NOT DETECTED.  The SARS-CoV-2 RNA is generally detectable in upper respiratory specimens during the acute phase of infection. The lowest concentration of SARS-CoV-2 viral copies this assay can detect is 138 copies/mL. A negative result does not preclude SARS-Cov-2 infection and should not be used as the sole basis for treatment or other patient management decisions. A negative result may occur with  improper specimen collection/handling, submission of specimen other than nasopharyngeal swab, presence of viral mutation(s) within the areas targeted by this assay, and inadequate number of viral copies(<138 copies/mL). A negative result must be combined with clinical observations, patient history, and epidemiological information. The expected result is Negative.  Fact Sheet for Patients:  EntrepreneurPulse.com.au  Fact Sheet for Healthcare Providers:  IncredibleEmployment.be  This test is no t yet approved or cleared by the [place] FDA and  has been authorized for detection and/or diagnosis of SARS-CoV-2 by FDA under an Emergency Use Authorization (EUA). This EUA will remain  in effect (meaning this test can be used) for the duration of the COVID-19  declaration under Section 564(b)(1) of the Act, 21 U.S.C.section 360bbb-3(b)(1), unless the authorization is terminated  or revoked sooner.       Influenza A by PCR NEGATIVE NEGATIVE Final   Influenza B by PCR NEGATIVE NEGATIVE Final    Comment: (NOTE) The Xpert Xpress SARS-CoV-2/FLU/RSV plus assay is intended as an aid in the diagnosis of influenza from Nasopharyngeal swab specimens and should not be used as a sole basis for treatment. Nasal washings and aspirates are unacceptable for Xpert Xpress SARS-CoV-2/FLU/RSV testing.  Fact Sheet for Patients: EntrepreneurPulse.com.au  Fact Sheet for Healthcare Providers: IncredibleEmployment.be  This test is not yet approved or cleared by the [place] FDA and has been authorized for detection and/or diagnosis of SARS-CoV-2 by FDA under an Emergency Use Authorization (EUA). This EUA will remain in effect (meaning this test can be used) for the duration of the COVID-19 declaration under Section 564(b)(1) of the Act, 21 U.S.C. section 360bbb-3(b)(1), unless the authorization is terminated or revoked.  Performed at [facility], [place] [address], [place], [place] [address]   MRSA PCR Screening     Status: None   Collection Time: 10/19/20  1:26 AM   Specimen: Nasopharyngeal  Result Value Ref Range Status   MRSA by PCR NEGATIVE NEGATIVE Final    Comment:        The GeneXpert MRSA Assay (FDA approved for NASAL specimens only), is one component of a comprehensive MRSA colonization surveillance program. It is not intended to diagnose MRSA infection nor to guide or monitor treatment for MRSA infections. Performed at [facility], [place] [address], [place], [place] [address]          Radiology Studies: CT ABDOMEN PELVIS WO CONTRAST  Result Date: 10/18/2020 CLINICAL DATA:  60 year old female with abdominal distension. EXAM: CT ABDOMEN AND PELVIS WITHOUT  CONTRAST TECHNIQUE: Multidetector CT imaging of the abdomen and pelvis was performed following the standard protocol without IV contrast. COMPARISON:  Ultrasound dated 06/21/2018. FINDINGS: Evaluation of this exam is limited in the absence of intravenous contrast. Lower chest: The visualized lung bases are clear. No intra-abdominal free air.  Small ascites. Hepatobiliary: Severe fatty liver. There is irregularity of the liver contour suggestive of cirrhosis. Clinical correlation is recommended. Layering stones noted in the gallbladder. Pancreas: The pancreas is atrophic.  No active inflammatory changes. Spleen: Normal in size without focal abnormality. Adrenals/Urinary Tract: The adrenal  glands unremarkable. The kidneys, visualized ureters, and urinary bladder appear unremarkable. Stomach/Bowel: There is no bowel obstruction or active inflammation. No CT findings of acute appendicitis. Vascular/Lymphatic: The abdominal aorta and IVC unremarkable. No portal venous gas. There is no adenopathy. Reproductive: The uterus and ovaries are grossly unremarkable. Other: There is diffuse mesenteric edema and small ascites. Musculoskeletal: Degenerative changes of the spine. No acute osseous pathology. IMPRESSION: 1. Severe fatty liver with findings of cirrhosis. Clinical correlation is recommended. 2. Cholelithiasis. 3. No bowel obstruction. 4. Small ascites. Electronically Signed   By: Anner Crete M.D.   On: 10/18/2020 23:37   CT Head Wo Contrast  Result Date: 10/18/2020 CLINICAL DATA:  Generalized weakness and vomiting. EXAM: CT HEAD WITHOUT CONTRAST TECHNIQUE: Contiguous axial images were obtained from the base of the skull through the vertex without intravenous contrast. COMPARISON:  None. FINDINGS: Brain: No evidence of acute infarction, hemorrhage, hydrocephalus, extra-axial collection or mass lesion/mass effect. Vascular: No hyperdense vessel or unexpected calcification. Skull: Normal. Negative for fracture or  focal lesion. Sinuses/Orbits: No acute finding. Other: None. IMPRESSION: No acute intracranial abnormality. Electronically Signed   By: Virgina Norfolk M.D.   On: 10/18/2020 21:29   DG Chest Port 1 View  Result Date: 10/18/2020 CLINICAL DATA:  Generalized weakness. EXAM: PORTABLE CHEST 1 VIEW COMPARISON:  August 21, 2012 FINDINGS: The heart size and mediastinal contours are within normal limits. Both lungs are clear. The visualized skeletal structures are unremarkable. IMPRESSION: No active disease. Electronically Signed   By: Virgina Norfolk M.D.   On: 10/18/2020 21:11        Scheduled Meds: . chlorhexidine  15 mL Mouth Rinse BID  . Chlorhexidine Gluconate Cloth  6 each Topical Daily  . folic acid  1 mg Oral Daily  . lactulose  10 g Oral BID  . mouth rinse  15 mL Mouth Rinse q12n4p  . multivitamin with minerals  1 tablet Oral Daily  . [START ON 10/20/2020] pneumococcal 23 valent vaccine  0.5 mL Intramuscular Tomorrow-1000  . thiamine  100 mg Oral Daily   Or  . thiamine  100 mg Intravenous Daily   Continuous Infusions: . sodium chloride 50 mL/hr at 10/19/20 0734  . potassium chloride       LOS: 1 day   Time spent: 45 min  Little Ishikawa, DO [facility]  If 7PM-7AM, please contact night-coverage [email or web address]  10/19/2020, 7:34 AM

## 2020-10-19 NOTE — TOC Initial Note (Signed)
Transition of Care [facility]) - Initial/Assessment Note    Patient Details  Name: Lori Oconnell MRN: [id number] Date of Birth: [birth year]  Transition of Care [facility]) CM/SW Contact:    Leeroy Cha, RN Phone Number: 10/19/2020, 9:38 AM  Clinical Narrative:                 60 y.o. female with medical history significant for heavy alcohol abuse, HTN, HLD who presents due to family concern for altered mental status.  Patient alert and oriented only to self and place.  Husband at bedside provide history.  Patient has been drinking heavily for years and recently have gotten worse since her adult son was recently put back into prison.  Around Easter, she left her husband and went to stay with her daughter and reportedly has been drinking nonstop.  Husband thinks she drinks at least a pint of liquor a day.  Today her granddaughter found her on the floor when she went to school and when she came back patient was still on the floor after 8 hours.  Patient denies any pain or discomfort but otherwise unable to provide any history.  ED Course: Was afebrile and normotensive on room air.  WBC of 13.6, hemoglobin of 9.7.  Sodium of 129.  Potassium of 2.  Creatinine of 1.84.  BG of 110.  AST of 106, ALT 57, total bilirubin of 28.  INR of 2.4.  Ammonia level of 160.  Patient was given lactulose, 1 L of normal saline fluid with a CT abdomen pending and hospitalist was called for admission. PLAN: To return to home with spouse and self care, will give substance abuse resourceses to patient. Expected Discharge Plan: Home/Self Care Barriers to Discharge: Continued Medical Work up   Patient Goals and CMS Choice Patient states their goals for this hospitalization and ongoing recovery are:: to go home CMS Medicare.gov Compare Post Acute Care list provided to:: Patient    Expected Discharge Plan and Services Expected Discharge Plan: Home/Self Care   Discharge Planning Services: CM Consult   Living  arrangements for the past 2 months: Single Family Home                                      Prior Living Arrangements/Services Living arrangements for the past 2 months: Single Family Home Lives with:: Spouse Patient language and need for interpreter reviewed:: Yes Do you feel safe going back to the place where you live?: Yes      Need for Family Participation in Patient Care: Yes (Comment) Care giver support system in place?: Yes (comment)   Criminal Activity/Legal Involvement Pertinent to Current Situation/Hospitalization: No - Comment as needed  Activities of Daily Living   ADL Screening (condition at time of admission) Is the patient deaf or have difficulty hearing?: No Does the patient have difficulty seeing, even when wearing glasses/contacts?: No Does the patient have difficulty concentrating, remembering, or making decisions?: Yes Does the patient have difficulty dressing or bathing?: No Does the patient have difficulty walking or climbing stairs?: No  Permission Sought/Granted                  Emotional Assessment Appearance:: Appears stated age Attitude/Demeanor/Rapport: Engaged Affect (typically observed): Calm Orientation: : Oriented to Place,Oriented to Self,Oriented to  Time,Oriented to Situation Alcohol / Substance Use: Not Applicable Psych Involvement: No (comment)  Admission diagnosis:  Hepatic encephalopathy ([place]) [K72.90]  Hypokalemia [E87.6] Bilirubinemia [E80.6] AKI (acute kidney injury) ([place]) [N17.9] Acute liver failure with hepatic coma ([place]) [K72.01] Patient Active Problem List   Diagnosis Date Noted  . Alcoholic cirrhosis of liver without ascites ([place]) 10/19/2020  . Supratherapeutic INR 10/19/2020  . Hyperbilirubinemia 10/19/2020  . Hyponatremia 10/19/2020  . AKI (acute kidney injury) ([place]) 10/19/2020  . Hepatic encephalopathy ([place]) 10/18/2020  . Allergic rhinitis 06/12/2019  . Allergic conjunctivitis 06/12/2019  . Abnormal LFTs  06/12/2019  . Poor dentition 05/15/2018  . Left ankle swelling 11/04/2015  . Rash 11/26/2014  . Hot flashes 07/02/2013  . Anemia, unspecified 12/10/2012  . 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 Depression with anxiety 11/09/2012  . 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 Mass on back 08/13/2012  . Lumbar radicular pain 10/08/2011  . Lipoma of neck 10/08/2011  . Neck pain 03/22/2011  . Edema 03/22/2011  . Irregular menses 03/22/2011  . Encounter for well adult exam with abnormal findings 03/19/2011  . HEMATOCHEZIA 11/30/2009  . Hyperlipidemia 07/18/2009  . [place] DISEASE, CERVICAL 07/13/2009  . Essential hypertension 07/12/2009  . RENAL INSUFFICIENCY 07/12/2009  . OVERACTIVE BLADDER 07/12/2009  . NECK PAIN 07/12/2009  . TRANSAMINASES, SERUM, ELEVATED 07/12/2009   PCP:  [name], MD Pharmacy:   [id number] - [place], [place] [address] Phone: [phone] Fax: [phone]  [place], [address], [address] [place], [address] [place] [address] Phone: [phone] Fax: [phone]     Social Determinants of Health (SDOH) Interventions    Readmission Risk Interventions No flowsheet data found.

## 2020-10-20 DIAGNOSIS — K7010 Alcoholic hepatitis without ascites: Secondary | ICD-10-CM

## 2020-10-20 LAB — COMPREHENSIVE METABOLIC PANEL
ALT: 53 U/L — ABNORMAL HIGH (ref 0–44)
AST: 96 U/L — ABNORMAL HIGH (ref 15–41)
Albumin: 2 g/dL — ABNORMAL LOW (ref 3.5–5.0)
Alkaline Phosphatase: 116 U/L (ref 38–126)
Anion gap: 8 (ref 5–15)
BUN: 26 mg/dL — ABNORMAL HIGH (ref 6–20)
CO2: 23 mmol/L (ref 22–32)
Calcium: 7.4 mg/dL — ABNORMAL LOW (ref 8.9–10.3)
Chloride: 102 mmol/L (ref 98–111)
Creatinine, Ser: 1.41 mg/dL — ABNORMAL HIGH (ref 0.44–1.00)
GFR, Estimated: 43 mL/min — ABNORMAL LOW (ref >60)
Glucose, Bld: 98 mg/dL (ref 70–99)
Potassium: 2.3 mmol/L — CL (ref 3.5–5.1)
Sodium: 133 mmol/L — ABNORMAL LOW (ref 135–145)
Total Bilirubin: 25.1 mg/dL (ref 0.3–1.2)
Total Protein: 6 g/dL — ABNORMAL LOW (ref 6.5–8.1)

## 2020-10-20 LAB — CBC
HCT: 17.6 % — ABNORMAL LOW (ref 36.0–46.0)
Hemoglobin: 6.6 g/dL — CL (ref 12.0–15.0)
MCH: 43.1 pg — ABNORMAL HIGH (ref 26.0–34.0)
MCHC: 37.5 g/dL — ABNORMAL HIGH (ref 30.0–36.0)
MCV: 115 fL — ABNORMAL HIGH (ref 80.0–100.0)
Platelets: 126 10*3/uL — ABNORMAL LOW (ref 150–400)
RBC: 1.53 MIL/uL — ABNORMAL LOW (ref 3.87–5.11)
RDW: 20.9 % — ABNORMAL HIGH (ref 11.5–15.5)
WBC: 11.5 10*3/uL — ABNORMAL HIGH (ref 4.0–10.5)
nRBC: 1 % — ABNORMAL HIGH (ref 0.0–0.2)

## 2020-10-20 LAB — PROTIME-INR
INR: 2.4 — ABNORMAL HIGH (ref 0.8–1.2)
Prothrombin Time: 26 seconds — ABNORMAL HIGH (ref 11.4–15.2)

## 2020-10-20 LAB — ABO/RH: ABO/RH(D): O POS

## 2020-10-20 LAB — HEMOGLOBIN AND HEMATOCRIT, BLOOD
HCT: 17.4 % — ABNORMAL LOW (ref 36.0–46.0)
HCT: 22.7 % — ABNORMAL LOW (ref 36.0–46.0)
Hemoglobin: 6.7 g/dL — CL (ref 12.0–15.0)
Hemoglobin: 8.4 g/dL — ABNORMAL LOW (ref 12.0–15.0)

## 2020-10-20 LAB — PREPARE RBC (CROSSMATCH)

## 2020-10-20 LAB — POTASSIUM: Potassium: 3 mmol/L — ABNORMAL LOW (ref 3.5–5.1)

## 2020-10-20 MED ORDER — SODIUM CHLORIDE 0.9% IV SOLUTION
Freq: Once | INTRAVENOUS | Status: AC
Start: 2020-10-20 — End: 2020-10-20

## 2020-10-20 MED ORDER — POTASSIUM CHLORIDE 20 MEQ PO PACK
40.0000 meq | PACK | Freq: Once | ORAL | Status: AC
  Administered 2020-10-20: 40 meq via ORAL
  Filled 2020-10-20: qty 2

## 2020-10-20 MED ORDER — POTASSIUM CHLORIDE 10 MEQ/100ML IV SOLN
10.0000 meq | INTRAVENOUS | Status: AC
  Administered 2020-10-20 (×6): 10 meq via INTRAVENOUS
  Filled 2020-10-20 (×6): qty 100

## 2020-10-20 MED ORDER — POTASSIUM CHLORIDE IN NACL 40-0.9 MEQ/L-% IV SOLN
INTRAVENOUS | Status: DC
  Filled 2020-10-20: qty 1000

## 2020-10-20 NOTE — Progress Notes (Signed)
Called by RN. Hgb dropped to 6.5 from 8.7. No sign of bleeding. Vital sounds stable.  RN unsure if blood was drawn from arm receiving IV fluids.  Draw type and screen and obtain STAT H/H to repeat and confirm is low. If still low will transfuse PRBC. Potassium still low. Add K to IVF infusion.

## 2020-10-20 NOTE — TOC Progression Note (Signed)
Transition of Care [facility]) - Progression Note    Patient Details  Name: Lori Oconnell MRN: [id number] Date of Birth: 1960/12/18  Transition of Care [facility]) CM/SW Contact  Leeroy Cha, RN Phone Number: 10/20/2020, 9:02 AM  Clinical Narrative:    [id number]/hgb 6.7 typed and crossmatch for 1 unit prbc   Expected Discharge Plan: Home/Self Care Barriers to Discharge: Continued Medical Work up  Expected Discharge Plan and Services Expected Discharge Plan: Home/Self Care   Discharge Planning Services: CM Consult   Living arrangements for the past 2 months: Single Family Home                                       Social Determinants of Health (SDOH) Interventions    Readmission Risk Interventions No flowsheet data found.

## 2020-10-20 NOTE — Progress Notes (Signed)
Progress Note    Lori Oconnell   DXI:[id number]  DOB: Jul 21, 1960  DOA: 10/18/2020     2  PCP: [name], MD  CC: AMS  Hospital Course: Lori Oconnell a 60 y.o.femalewith medical history significant forheavy alcohol abuse, HTN, HLD who presentsdue to family concern for altered mental status. Patient alert and oriented only to self and place on admission.  Patient was reported to have been drinking more heavily prior to admission after recent stressors at home. She was on a drinking binge and averaging approximately 1 pint of liquor per day.  She was found on the floor by granddaughter prior to being brought to the hospital.  Interval History:  No events overnight.  Resting in bed.  Awake and able to converse better this morning.  ROS: Constitutional: negative for chills and fevers, Respiratory: negative for cough, Cardiovascular: negative for chest pain and Gastrointestinal: negative for abdominal pain  Assessment & Plan: Acute hepatic encephalopathy - Ammonia level of 160 on 10/18/20 - continue lactulose -Mentation improved  Alcoholic cirrhosis Alcoholic hepatitis - small ascites seen on CT A/P on 10/18/20 - at risk for progressive liver failure; patient heavily counseled for alcohol cessation - Continue monitoring for signs of withdrawal  Macrocytic anemia - MCV 115 - folate low 4.3, B12 is 3972 - also likely chronic component 2/2 etoh abuse   Hypokalemia - Repleted, follow repeat labs  Hyponatremia -Na of 129 from dehydration and alcohol use - s/p repeltion  AKI - Likely pre-renal - Follow Cr with IV fluid resuscitation  Old records reviewed in assessment of this patient  Antimicrobials:   DVT prophylaxis: SCDs Start: 10/18/20 2307   Code Status:   Code Status: Full Code Family Communication:   Disposition Plan: Status is: Inpatient  Remains inpatient appropriate because:Inpatient level of care appropriate due to severity of illness   Dispo:  The patient is from: Home              Anticipated d/c is to: Home              Patient currently is not medically stable to d/c.   Difficult to place patient No  Risk of unplanned readmission score: Unplanned Admission- Pilot do not use: 13.16   Objective: Blood pressure 112/72, pulse 87, temperature 97.7 F (36.5 C), temperature source Oral, resp. rate 14, height 5\' 6"  (1.676 m), weight 77 kg, last menstrual period 03/19/2011, SpO2 100 %.  Examination: General appearance: awake, alert, cooperative Head: Normocephalic, without obvious abnormality, atraumatic Eyes: scleral icterus appreciated Lungs: clear to auscultation bilaterally Heart: regular rate and rhythm and S1, S2 normal Abdomen: soft, NT, ND, BS present Extremities: no edema Skin: mobility and turgor normal Neurologic: Grossly normal  Consultants:     Procedures:     Data Reviewed: I have personally reviewed following labs and imaging studies Results for orders placed or performed during the hospital encounter of 10/18/20 (from the past 24 hour(s))  CBC     Status: Abnormal   Collection Time: 10/20/20  3:10 AM  Result Value Ref Range   WBC 11.5 (H) 4.0 - 10.5 K/uL   RBC 1.53 (L) 3.87 - 5.11 MIL/uL   Hemoglobin 6.6 (LL) 12.0 - 15.0 g/dL   HCT 17.6 (L) 36.0 - 46.0 %   MCV 115.0 (H) 80.0 - 100.0 fL   MCH 43.1 (H) 26.0 - 34.0 pg   MCHC 37.5 (H) 30.0 - 36.0 g/dL   RDW 20.9 (H) 11.5 - 15.5 %  Platelets 126 (L) 150 - 400 K/uL   nRBC 1.0 (H) 0.0 - 0.2 %  Comprehensive metabolic panel     Status: Abnormal   Collection Time: 10/20/20  3:10 AM  Result Value Ref Range   Sodium 133 (L) 135 - 145 mmol/L   Potassium 2.3 (LL) 3.5 - 5.1 mmol/L   Chloride 102 98 - 111 mmol/L   CO2 23 22 - 32 mmol/L   Glucose, Bld 98 70 - 99 mg/dL   BUN 26 (H) 6 - 20 mg/dL   Creatinine, Ser 1.41 (H) 0.44 - 1.00 mg/dL   Calcium 7.4 (L) 8.9 - 10.3 mg/dL   Total Protein 6.0 (L) 6.5 - 8.1 g/dL   Albumin 2.0 (L) 3.5 - 5.0 g/dL   AST 96  (H) 15 - 41 U/L   ALT 53 (H) 0 - 44 U/L   Alkaline Phosphatase 116 38 - 126 U/L   Total Bilirubin 25.1 (HH) 0.3 - 1.2 mg/dL   GFR, Estimated 43 (L) >60 mL/min   Anion gap 8 5 - 15  Protime-INR     Status: Abnormal   Collection Time: 10/20/20  3:10 AM  Result Value Ref Range   Prothrombin Time 26.0 (H) 11.4 - 15.2 seconds   INR 2.4 (H) 0.8 - 1.2  ABO/Rh     Status: None   Collection Time: 10/20/20  3:10 AM  Result Value Ref Range   ABO/RH(D)      O POS Performed at [facility], [place] [address], [place], [place] [address]   Hemoglobin and hematocrit, blood     Status: Abnormal   Collection Time: 10/20/20  5:48 AM  Result Value Ref Range   Hemoglobin 6.7 (LL) 12.0 - 15.0 g/dL   HCT 17.4 (L) 36.0 - 46.0 %  Type and screen [place]     Status: None (Preliminary result)   Collection Time: 10/20/20  5:48 AM  Result Value Ref Range   ABO/RH(D) O POS    Antibody Screen NEG    Sample Expiration 10/23/2020,2359    Unit Number [id number]    Blood Component Type RED CELLS,LR    Unit division 00    Status of Unit ISSUED    Transfusion Status OK TO TRANSFUSE    Crossmatch Result      Compatible Performed at [facility], [place] [address], [place], [place] [address]   Prepare RBC (crossmatch)     Status: None   Collection Time: 10/20/20  7:49 AM  Result Value Ref Range   Order Confirmation      ORDER PROCESSED BY BLOOD BANK Performed at [facility], [place] [address], [place], [place] [address]     Recent Results (from the past 240 hour(s))  Resp Panel by RT-PCR (Flu A&B, Covid) Nasopharyngeal Swab     Status: None   Collection Time: 10/18/20 10:04 PM   Specimen: Nasopharyngeal Swab; Nasopharyngeal(NP) swabs in vial transport medium  Result Value Ref Range Status   SARS Coronavirus 2 by RT PCR NEGATIVE NEGATIVE Final    Comment: (NOTE) SARS-CoV-2 target nucleic acids are NOT DETECTED.  The SARS-CoV-2  RNA is generally detectable in upper respiratory specimens during the acute phase of infection. The lowest concentration of SARS-CoV-2 viral copies this assay can detect is 138 copies/mL. A negative result does not preclude SARS-Cov-2 infection and should not be used as the sole basis for treatment or other patient management decisions. A negative result may occur  with  improper specimen collection/handling, submission of specimen other than nasopharyngeal swab, presence of viral mutation(s) within the areas targeted by this assay, and inadequate number of viral copies(<138 copies/mL). A negative result must be combined with clinical observations, patient history, and epidemiological information. The expected result is Negative.  Fact Sheet for Patients:  EntrepreneurPulse.com.au  Fact Sheet for Healthcare Providers:  IncredibleEmployment.be  This test is no t yet approved or cleared by the [place] FDA and  has been authorized for detection and/or diagnosis of SARS-CoV-2 by FDA under an Emergency Use Authorization (EUA). This EUA will remain  in effect (meaning this test can be used) for the duration of the COVID-19 declaration under Section 564(b)(1) of the Act, 21 U.S.C.section 360bbb-3(b)(1), unless the authorization is terminated  or revoked sooner.       Influenza A by PCR NEGATIVE NEGATIVE Final   Influenza B by PCR NEGATIVE NEGATIVE Final    Comment: (NOTE) The Xpert Xpress SARS-CoV-2/FLU/RSV plus assay is intended as an aid in the diagnosis of influenza from Nasopharyngeal swab specimens and should not be used as a sole basis for treatment. Nasal washings and aspirates are unacceptable for Xpert Xpress SARS-CoV-2/FLU/RSV testing.  Fact Sheet for Patients: EntrepreneurPulse.com.au  Fact Sheet for Healthcare Providers: IncredibleEmployment.be  This test is not yet approved or cleared by the  [place] FDA and has been authorized for detection and/or diagnosis of SARS-CoV-2 by FDA under an Emergency Use Authorization (EUA). This EUA will remain in effect (meaning this test can be used) for the duration of the COVID-19 declaration under Section 564(b)(1) of the Act, 21 U.S.C. section 360bbb-3(b)(1), unless the authorization is terminated or revoked.  Performed at [facility], [place] [address] [place], [place] [address]   MRSA PCR Screening     Status: None   Collection Time: 10/19/20  1:26 AM   Specimen: Nasopharyngeal  Result Value Ref Range Status   MRSA by PCR NEGATIVE NEGATIVE Final    Comment:        The GeneXpert MRSA Assay (FDA approved for NASAL specimens only), is one component of a comprehensive MRSA colonization surveillance program. It is not intended to diagnose MRSA infection nor to guide or monitor treatment for MRSA infections. Performed at [facility], [place] [address], [place], [place] [address]      Radiology Studies: CT ABDOMEN PELVIS WO CONTRAST  Result Date: 10/18/2020 CLINICAL DATA:  60 year old female with abdominal distension. EXAM: CT ABDOMEN AND PELVIS WITHOUT CONTRAST TECHNIQUE: Multidetector CT imaging of the abdomen and pelvis was performed following the standard protocol without IV contrast. COMPARISON:  Ultrasound dated 06/21/2018. FINDINGS: Evaluation of this exam is limited in the absence of intravenous contrast. Lower chest: The visualized lung bases are clear. No intra-abdominal free air.  Small ascites. Hepatobiliary: Severe fatty liver. There is irregularity of the liver contour suggestive of cirrhosis. Clinical correlation is recommended. Layering stones noted in the gallbladder. Pancreas: The pancreas is atrophic.  No active inflammatory changes. Spleen: Normal in size without focal abnormality. Adrenals/Urinary Tract: The adrenal glands unremarkable. The kidneys, visualized ureters, and  urinary bladder appear unremarkable. Stomach/Bowel: There is no bowel obstruction or active inflammation. No CT findings of acute appendicitis. Vascular/Lymphatic: The abdominal aorta and IVC unremarkable. No portal venous gas. There is no adenopathy. Reproductive: The uterus and ovaries are grossly unremarkable. Other: There is diffuse mesenteric edema and small ascites. Musculoskeletal: Degenerative changes of the spine. No acute osseous pathology. IMPRESSION: 1. Severe fatty liver  with findings of cirrhosis. Clinical correlation is recommended. 2. Cholelithiasis. 3. No bowel obstruction. 4. Small ascites. Electronically Signed   By: Anner Crete M.D.   On: 10/18/2020 23:37   CT Head Wo Contrast  Result Date: 10/18/2020 CLINICAL DATA:  Generalized weakness and vomiting. EXAM: CT HEAD WITHOUT CONTRAST TECHNIQUE: Contiguous axial images were obtained from the base of the skull through the vertex without intravenous contrast. COMPARISON:  None. FINDINGS: Brain: No evidence of acute infarction, hemorrhage, hydrocephalus, extra-axial collection or mass lesion/mass effect. Vascular: No hyperdense vessel or unexpected calcification. Skull: Normal. Negative for fracture or focal lesion. Sinuses/Orbits: No acute finding. Other: None. IMPRESSION: No acute intracranial abnormality. Electronically Signed   By: Virgina Norfolk M.D.   On: 10/18/2020 21:29   DG Chest Port 1 View  Result Date: 10/18/2020 CLINICAL DATA:  Generalized weakness. EXAM: PORTABLE CHEST 1 VIEW COMPARISON:  August 21, 2012 FINDINGS: The heart size and mediastinal contours are within normal limits. Both lungs are clear. The visualized skeletal structures are unremarkable. IMPRESSION: No active disease. Electronically Signed   By: Virgina Norfolk M.D.   On: 10/18/2020 21:11   CT ABDOMEN PELVIS WO CONTRAST  Final Result    CT Head Wo Contrast  Final Result    DG Chest Port 1 View  Final Result      Scheduled Meds: . chlorhexidine   15 mL Mouth Rinse BID  . Chlorhexidine Gluconate Cloth  6 each Topical Daily  . folic acid  1 mg Oral Daily  . lactulose  10 g Oral BID  . mouth rinse  15 mL Mouth Rinse q12n4p  . multivitamin with minerals  1 tablet Oral Daily  . pneumococcal 23 valent vaccine  0.5 mL Intramuscular Tomorrow-1000  . thiamine  100 mg Oral Daily   Or  . thiamine  100 mg Intravenous Daily   PRN Meds: LORazepam **OR** LORazepam Continuous Infusions:   LOS: 2 days  Time spent: Greater than 50% of the 35 minute visit was spent in counseling/coordination of care for the patient as laid out in the A&P.   Dwyane Dee, MD [facility] 10/20/2020, 3:22 PM

## 2020-10-20 NOTE — Progress Notes (Signed)
Initial Nutrition Assessment  DOCUMENTATION CODES:  Not applicable  INTERVENTION:  Continue to advance diet as medically able.  Add Magic cup TID with meals, each supplement provides 290 kcal and 9 grams of protein.  Add 8 PM nourishment for cirrhosis - RD to order.  Continue CIWA protocol.  NUTRITION DIAGNOSIS:  Increased nutrient needs related to acute illness,chronic illness as evidenced by estimated needs.  GOAL:  Patient will meet greater than or equal to 90% of their needs  MONITOR:  PO intake,Supplement acceptance,Labs,Weight trends,I & O's  REASON FOR ASSESSMENT:  Malnutrition Screening Tool    ASSESSMENT:  60 yo female with a PMH of heavy EtOH abuse, HTN, and HLD who presents with acute hepatic encephalopathy 2/2 EtOH abuse and acute liver failure/severe cirrhosis.  Spoke with pt at bedside this morning. Pt in good spirits, seems less confused. Pt reports that she eats well at home. She enjoys vegetables and fruits, salads, chicken dishes, etc. Pt ate 50% of breakfast and dinner yesterday per meal documentation, as well as 75% of her breakfast this morning.  Pt denies any recent weight loss. Weight has remained stable over the past 5 years with fluctuation between 77-83 kg, per Epic.  On exam, pt had few mild depletions. She reports ambulating well. Given history of EtOH abuse and cirrhosis, pt at risk for malnutrition.  Recommend adding Magic Cup TID and 8 pm nourishment for cirrhosis - RD to order snack. Recommend continuing [place].  Medications: reviewed; folic acid, lactulose, MVI with minerals, thiamine, IVF with KCl 10 mEq  Labs: reviewed; Na 133, K 2.3  NUTRITION - FOCUSED PHYSICAL EXAM: Flowsheet Row Most Recent Value  Orbital Region Mild depletion  Upper Arm Region No depletion  Thoracic and Lumbar Region No depletion  Buccal Region No depletion  Temple Region No depletion  Clavicle Bone Region No depletion  Clavicle and Acromion Bone Region No  depletion  Scapular Bone Region No depletion  Dorsal Hand Mild depletion  Patellar Region No depletion  Anterior Thigh Region No depletion  Posterior Calf Region No depletion  Edema (RD Assessment) None  Hair Reviewed  Eyes Reviewed  [Yellow sclera]  Mouth Reviewed  Skin Reviewed  Nails Reviewed     Diet Order:   Diet Order            DIET SOFT Room service appropriate? Yes; Fluid consistency: Thin  Diet effective now                EDUCATION NEEDS:  Education needs have been addressed  Skin:  Skin Assessment: Reviewed RN Assessment  Last BM:  10/20/20 - Type 7; 5/10 - x5  Height:  Ht Readings from Last 1 Encounters:  10/18/20 5\' 6"  (1.676 m)   Weight:  Wt Readings from Last 1 Encounters:  10/19/20 77 kg   Ideal Body Weight:  59.1 kg  BMI:  Body mass index is 27.4 kg/m.  Estimated Nutritional Needs:  Kcal:  1800-2000 Protein:  90-105 grams Fluid:  >1.8 L  Derrel Nip, RD, LDN Registered Dietitian After Hours/Weekend Pager # in [place]

## 2020-10-21 DIAGNOSIS — K7010 Alcoholic hepatitis without ascites: Secondary | ICD-10-CM

## 2020-10-21 LAB — COMPREHENSIVE METABOLIC PANEL
ALT: 54 U/L — ABNORMAL HIGH (ref 0–44)
AST: 102 U/L — ABNORMAL HIGH (ref 15–41)
Albumin: 2 g/dL — ABNORMAL LOW (ref 3.5–5.0)
Alkaline Phosphatase: 119 U/L (ref 38–126)
Anion gap: 9 (ref 5–15)
BUN: 21 mg/dL — ABNORMAL HIGH (ref 6–20)
CO2: 22 mmol/L (ref 22–32)
Calcium: 7.8 mg/dL — ABNORMAL LOW (ref 8.9–10.3)
Chloride: 104 mmol/L (ref 98–111)
Creatinine, Ser: 0.86 mg/dL (ref 0.44–1.00)
GFR, Estimated: >60 mL/min (ref >60)
Glucose, Bld: 100 mg/dL — ABNORMAL HIGH (ref 70–99)
Potassium: 2.7 mmol/L — CL (ref 3.5–5.1)
Sodium: 135 mmol/L (ref 135–145)
Total Bilirubin: 26.4 mg/dL (ref 0.3–1.2)
Total Protein: 6.2 g/dL — ABNORMAL LOW (ref 6.5–8.1)

## 2020-10-21 LAB — CBC WITH DIFFERENTIAL/PLATELET
Abs Immature Granulocytes: 0.22 10*3/uL — ABNORMAL HIGH (ref 0.00–0.07)
Basophils Absolute: 0 10*3/uL (ref 0.0–0.1)
Basophils Relative: 0 %
Eosinophils Absolute: 0.1 10*3/uL (ref 0.0–0.5)
Eosinophils Relative: 1 %
HCT: 22.4 % — ABNORMAL LOW (ref 36.0–46.0)
Hemoglobin: 8.3 g/dL — ABNORMAL LOW (ref 12.0–15.0)
Immature Granulocytes: 2 %
Lymphocytes Relative: 8 %
Lymphs Abs: 1 10*3/uL (ref 0.7–4.0)
MCH: 40.1 pg — ABNORMAL HIGH (ref 26.0–34.0)
MCHC: 37.1 g/dL — ABNORMAL HIGH (ref 30.0–36.0)
MCV: 108.2 fL — ABNORMAL HIGH (ref 80.0–100.0)
Monocytes Absolute: 1.5 10*3/uL — ABNORMAL HIGH (ref 0.1–1.0)
Monocytes Relative: 12 %
Neutro Abs: 9.1 10*3/uL — ABNORMAL HIGH (ref 1.7–7.7)
Neutrophils Relative %: 77 %
Platelets: 123 10*3/uL — ABNORMAL LOW (ref 150–400)
RBC: 2.07 MIL/uL — ABNORMAL LOW (ref 3.87–5.11)
RDW: 25.4 % — ABNORMAL HIGH (ref 11.5–15.5)
WBC: 11.8 10*3/uL — ABNORMAL HIGH (ref 4.0–10.5)
nRBC: 0.8 % — ABNORMAL HIGH (ref 0.0–0.2)

## 2020-10-21 LAB — BPAM RBC
Blood Product Expiration Date: 202206062359
ISSUE DATE / TIME: 202205111011
Unit Type and Rh: 5100

## 2020-10-21 LAB — PROTIME-INR
INR: 2.5 — ABNORMAL HIGH (ref 0.8–1.2)
Prothrombin Time: 27.3 seconds — ABNORMAL HIGH (ref 11.4–15.2)

## 2020-10-21 LAB — TYPE AND SCREEN
ABO/RH(D): O POS
Antibody Screen: NEGATIVE
Unit division: 0

## 2020-10-21 LAB — GAMMA GT: GGT: 134 U/L — ABNORMAL HIGH (ref 7–50)

## 2020-10-21 LAB — MAGNESIUM: Magnesium: 1.6 mg/dL — ABNORMAL LOW (ref 1.7–2.4)

## 2020-10-21 MED ORDER — MAGNESIUM SULFATE 2 GM/50ML IV SOLN
2.0000 g | Freq: Once | INTRAVENOUS | Status: AC
  Administered 2020-10-21: 2 g via INTRAVENOUS
  Filled 2020-10-21: qty 50

## 2020-10-21 MED ORDER — PREDNISOLONE 5 MG PO TABS
40.0000 mg | ORAL_TABLET | Freq: Every day | ORAL | Status: DC
  Administered 2020-10-21 – 2020-10-27 (×7): 40 mg via ORAL
  Filled 2020-10-21 (×7): qty 8

## 2020-10-21 MED ORDER — POTASSIUM CHLORIDE 10 MEQ/100ML IV SOLN
10.0000 meq | INTRAVENOUS | Status: DC
  Administered 2020-10-21 (×2): 10 meq via INTRAVENOUS
  Filled 2020-10-21 (×2): qty 100

## 2020-10-21 NOTE — Care Management Important Message (Signed)
Important Message  Patient Details IM Letter given to the Patient. Name: Lori Oconnell MRN: [id number] Date of Birth: 1961-02-18   Medicare Important Message Given:  Yes     Kerin Salen 10/21/2020, 1:26 PM

## 2020-10-21 NOTE — Evaluation (Signed)
Physical Therapy Evaluation Patient Details Name: Lori Oconnell MRN: [id number] DOB: [birth year] Today's Date: 10/21/2020   History of Present Illness  Lori Oconnell is a 60 yo female with presents with AMS, found on the floor. PMH: HTN, heavy ETOH abuse  Clinical Impression  Pt admitted with above diagnosis. Pt a&o x4, but appears dazed looking off when not speaking. 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 Pt currently requiring min A with STS and stand pivot transfer, minimal ambulation using RW in room taking short, choppy steps with limited bil foot clearance, no LOB. Pt transfers to [facility] x2 for BM, able to perform pericare with single hand while other hand on RW to steady. Pt's spouse in room reports he works, unable to stay with pt 24/7, they do have a daughter who works from home but unsure if she can provide this much assistance while working; want to further discuss SNF vs HHPT with family. Pt currently with functional limitations due to the deficits listed below (see PT Problem List). Pt will benefit from skilled PT to increase their independence and safety with mobility to allow discharge to the venue listed below.       Follow Up Recommendations SNF (vs HHPT pending progress, family availability)    Equipment Recommendations  Rolling walker with 5" wheels;3in1 (PT)    Recommendations for Other Services       Precautions / Restrictions Precautions Precautions: Fall Restrictions Weight Bearing Restrictions: No      Mobility  Bed Mobility Overal bed mobility: Needs Assistance Bed Mobility: Supine to Sit  Supine to sit: Supervision;HOB elevated  General bed mobility comments: HOB elevated with use of bedrail to upright trunk and scoot out to EOB, increased time, no physical assist    Transfers Overall transfer level: Needs assistance Equipment used: Rolling walker (2 wheeled);None Transfers: Sit  to/from [employer] to Stand: Min assist;Min guard Stand pivot transfers: Min assist  General transfer comment: STS varying between min A and min guard with multiple reps, min A to steady with pivot over to [facility], VCs for hand placement when using RW, slightly unsteady  Ambulation/Gait Ambulation/Gait assistance: Min assist Gait Distance (Feet): 4 Feet Assistive device: Rolling walker (2 wheeled) Gait Pattern/deviations: Step-to pattern;Narrow base of support Gait velocity: decreased   General Gait Details: pt takes short, choppy slow steps at bedside with narrow BOS, cued for improved bil foot clearance and step length, decreased heel-toe gait, VCs for body positioning within RW frame  Stairs            Wheelchair Mobility    Modified Rankin (Stroke Patients Only)       Balance Overall balance assessment: Needs assistance Sitting-balance support: Feet supported Sitting balance-Leahy Scale: Good Sitting balance - Comments: seated EOB   Standing balance support: During functional activity;Bilateral upper extremity supported Standing balance-Leahy Scale: Poor Standing balance comment: reliant on UE support, able to release single hand for pericare and other hand on RW to steady          Pertinent Vitals/Pain Pain Assessment: No/denies pain    Home Living Family/patient expects to be discharged to:: Private residence Living Arrangements: Children;Spouse/significant other Available Help at Discharge: Family;Available PRN/intermittently Type of Home: House Home Access: Level entry     Home Layout: One level Home Equipment: None      Prior Function Level of Independence: Independent  Comments: Pt reports independent at baseline,  works as Quarry manager at [employer] in town, drives, denies recent falls.     Hand Dominance        Extremity/Trunk Assessment   Upper Extremity Assessment Upper Extremity Assessment: Defer to OT evaluation    Lower  Extremity Assessment Lower Extremity Assessment: Generalized weakness (AROM WNL, strength grossly 4-/5)    Cervical / Trunk Assessment Cervical / Trunk Assessment: Normal  Communication   Communication: No difficulties  Cognition Arousal/Alertness: Awake/alert Behavior During Therapy: Flat affect Overall Cognitive Status: Impaired/Different from baseline  General Comments: Pt arouses to verbal cues, takes a moment to fully awake, a&o x4. States current president correctly. Pt appears dazed during eval, looking off, responds to commands appropriately with increased time. Pt's spouse in room states pt is much better today, but agrees pt looks "spacey"      General Comments      Exercises     Assessment/Plan    PT Assessment Patient needs continued PT services  PT Problem List Decreased strength;Decreased activity tolerance;Decreased balance;Decreased mobility;Decreased cognition;Decreased knowledge of use of DME;Decreased safety awareness;Obesity       PT Treatment Interventions DME instruction;Gait training;Functional mobility training;Therapeutic activities;Therapeutic exercise;Balance training;Cognitive remediation;Patient/family education    PT Goals (Current goals can be found in the Care Plan section)  Acute Rehab PT Goals Patient Stated Goal: get back to baseline, unsure if SNF vs HHPT is better- want to discuss with other family PT Goal Formulation: With patient/family Time For Goal Achievement: 11/04/20 Potential to Achieve Goals: Good    Frequency Min 3X/week   Barriers to discharge        Co-evaluation               AM-PAC PT "6 Clicks" Mobility  Outcome Measure Help needed turning from your back to your side while in a flat bed without using bedrails?: A Little Help needed moving from lying on your back to sitting on the side of a flat bed without using bedrails?: A Little Help needed moving to and from a bed to a chair (including a wheelchair)?: A  Little Help needed standing up from a chair using your arms (e.g., wheelchair or bedside chair)?: A Little Help needed to walk in hospital room?: A Little Help needed climbing 3-5 steps with a railing? : A Lot 6 Click Score: 17    End of Session Equipment Utilized During Treatment: Gait belt Activity Tolerance: Patient tolerated treatment well Patient left: in chair;with call bell/phone within reach;with chair alarm set;with family/visitor present Nurse Communication: Mobility status;Other (comment) (2 BMs) PT Visit Diagnosis: Unsteadiness on feet (R26.81);Other abnormalities of gait and mobility (R26.89);Difficulty in walking, not elsewhere classified (R26.2)    Time: 0981-1914 PT Time Calculation (min) (ACUTE ONLY): 36 min   Charges:   PT Evaluation $PT Eval Low Complexity: 1 Low PT Treatments $Therapeutic Activity: 8-22 mins         Tori Denzell Colasanti PT, DPT 10/21/20, 3:45 PM

## 2020-10-21 NOTE — Progress Notes (Signed)
Progress Note    Nishtha Raider   WUJ:[id number]  DOB: 1961-02-06  DOA: 10/18/2020     3  PCP: [name], MD  CC: AMS  Hospital Course: Jerae Izard a 60 y.o.femalewith medical history significant forheavy alcohol abuse, HTN, HLD who presentsdue to family concern for altered mental status. Patient alert and oriented only to self and place on admission.  Patient was reported to have been drinking more heavily prior to admission after recent stressors at home. She was on a drinking binge and averaging approximately 1 pint of liquor per day.  She was found on the floor by granddaughter prior to being brought to the hospital.  Interval History:  No events overnight.  Husband present bedside this morning.  Update given and questions answered. She is hoping to discharge home soon and overall is feeling relatively improved.  She is tolerating a diet and denies any nausea/vomiting or diarrhea.  Has not been out of bed much but knows that physical therapy will work with her hopefully today.  ROS: Constitutional: negative for chills and fevers, Respiratory: negative for cough, Cardiovascular: negative for chest pain and Gastrointestinal: negative for abdominal pain  Assessment & Plan: Acute hepatic encephalopathy - Ammonia level of 160 on 10/18/20 - continue lactulose -Mentation improved  Alcoholic cirrhosis Alcoholic hepatitis - small ascites seen on CT A/P on 10/18/20 - at risk for progressive liver failure; patient heavily counseled for alcohol cessation - Continue monitoring for signs of withdrawal -Discriminant function 96 points.  Given minimal improvement, will start on prednisolone for at least 1 week.  She will need repeat CMP, PT/INR to decide if warrants continuing steroids for up to 28 days.  Macrocytic anemia - MCV 115 - folate low 4.3, B12 is 9562 -Continue folic acid and multivitamin - also likely chronic component 2/2 etoh abuse   Hypokalemia - Repleted, follow  repeat labs  Hyponatremia -Na of 129 from dehydration and alcohol use - s/p repeltion  AKI - Likely pre-renal - Follow Cr with IV fluid resuscitation  Old records reviewed in assessment of this patient  Antimicrobials:   DVT prophylaxis: SCDs Start: 10/18/20 2307   Code Status:   Code Status: Full Code Family Communication:   Disposition Plan: Status is: Inpatient  Remains inpatient appropriate because:Inpatient level of care appropriate due to severity of illness   Dispo: The patient is from: Home              Anticipated d/c is to: Home              Patient currently is not medically stable to d/c.   Difficult to place patient No  Risk of unplanned readmission score: Unplanned Admission- Pilot do not use: 13.37   Objective: Blood pressure 113/70, pulse 81, temperature 97.7 F (36.5 C), resp. rate 16, height 5\' 6"  (1.676 m), weight 77 kg, last menstrual period 03/19/2011, SpO2 100 %.  Examination: General appearance: awake, alert, cooperative Head: Normocephalic, without obvious abnormality, atraumatic Eyes: scleral icterus appreciated Lungs: clear to auscultation bilaterally Heart: regular rate and rhythm and S1, S2 normal Abdomen: soft, NT, ND, BS present Extremities: no edema Skin: mobility and turgor normal Neurologic: Grossly normal  Consultants:     Procedures:     Data Reviewed: I have personally reviewed following labs and imaging studies Results for orders placed or performed during the hospital encounter of 10/18/20 (from the past 24 hour(s))  Hemoglobin and hematocrit, blood     Status: Abnormal   Collection  Time: 10/20/20  3:34 PM  Result Value Ref Range   Hemoglobin 8.4 (L) 12.0 - 15.0 g/dL   HCT 22.7 (L) 36.0 - 46.0 %  Potassium     Status: Abnormal   Collection Time: 10/20/20  3:34 PM  Result Value Ref Range   Potassium 3.0 (L) 3.5 - 5.1 mmol/L  Comprehensive metabolic panel     Status: Abnormal   Collection Time: 10/21/20  4:39 AM   Result Value Ref Range   Sodium 135 135 - 145 mmol/L   Potassium 2.7 (LL) 3.5 - 5.1 mmol/L   Chloride 104 98 - 111 mmol/L   CO2 22 22 - 32 mmol/L   Glucose, Bld 100 (H) 70 - 99 mg/dL   BUN 21 (H) 6 - 20 mg/dL   Creatinine, Ser 0.86 0.44 - 1.00 mg/dL   Calcium 7.8 (L) 8.9 - 10.3 mg/dL   Total Protein 6.2 (L) 6.5 - 8.1 g/dL   Albumin 2.0 (L) 3.5 - 5.0 g/dL   AST 102 (H) 15 - 41 U/L   ALT 54 (H) 0 - 44 U/L   Alkaline Phosphatase 119 38 - 126 U/L   Total Bilirubin 26.4 (HH) 0.3 - 1.2 mg/dL   GFR, Estimated >60 >60 mL/min   Anion gap 9 5 - 15  Protime-INR     Status: Abnormal   Collection Time: 10/21/20  4:39 AM  Result Value Ref Range   Prothrombin Time 27.3 (H) 11.4 - 15.2 seconds   INR 2.5 (H) 0.8 - 1.2  CBC with Differential/Platelet     Status: Abnormal   Collection Time: 10/21/20  4:39 AM  Result Value Ref Range   WBC 11.8 (H) 4.0 - 10.5 K/uL   RBC 2.07 (L) 3.87 - 5.11 MIL/uL   Hemoglobin 8.3 (L) 12.0 - 15.0 g/dL   HCT 22.4 (L) 36.0 - 46.0 %   MCV 108.2 (H) 80.0 - 100.0 fL   MCH 40.1 (H) 26.0 - 34.0 pg   MCHC 37.1 (H) 30.0 - 36.0 g/dL   RDW 25.4 (H) 11.5 - 15.5 %   Platelets 123 (L) 150 - 400 K/uL   nRBC 0.8 (H) 0.0 - 0.2 %   Neutrophils Relative % 77 %   Neutro Abs 9.1 (H) 1.7 - 7.7 K/uL   Lymphocytes Relative 8 %   Lymphs Abs 1.0 0.7 - 4.0 K/uL   Monocytes Relative 12 %   Monocytes Absolute 1.5 (H) 0.1 - 1.0 K/uL   Eosinophils Relative 1 %   Eosinophils Absolute 0.1 0.0 - 0.5 K/uL   Basophils Relative 0 %   Basophils Absolute 0.0 0.0 - 0.1 K/uL   Immature Granulocytes 2 %   Abs Immature Granulocytes 0.22 (H) 0.00 - 0.07 K/uL   Polychromasia PRESENT    Target Cells PRESENT   Magnesium     Status: Abnormal   Collection Time: 10/21/20  4:39 AM  Result Value Ref Range   Magnesium 1.6 (L) 1.7 - 2.4 mg/dL  Gamma GT     Status: Abnormal   Collection Time: 10/21/20  4:39 AM  Result Value Ref Range   GGT 134 (H) 7 - 50 U/L    Recent Results (from the past 240  hour(s))  Resp Panel by RT-PCR (Flu A&B, Covid) Nasopharyngeal Swab     Status: None   Collection Time: 10/18/20 10:04 PM   Specimen: Nasopharyngeal Swab; Nasopharyngeal(NP) swabs in vial transport medium  Result Value Ref Range Status   SARS Coronavirus 2 by RT  PCR NEGATIVE NEGATIVE Final    Comment: (NOTE) SARS-CoV-2 target nucleic acids are NOT DETECTED.  The SARS-CoV-2 RNA is generally detectable in upper respiratory specimens during the acute phase of infection. The lowest concentration of SARS-CoV-2 viral copies this assay can detect is 138 copies/mL. A negative result does not preclude SARS-Cov-2 infection and should not be used as the sole basis for treatment or other patient management decisions. A negative result may occur with  improper specimen collection/handling, submission of specimen other than nasopharyngeal swab, presence of viral mutation(s) within the areas targeted by this assay, and inadequate number of viral copies(<138 copies/mL). A negative result must be combined with clinical observations, patient history, and epidemiological information. The expected result is Negative.  Fact Sheet for Patients:  EntrepreneurPulse.com.au  Fact Sheet for Healthcare Providers:  IncredibleEmployment.be  This test is no t yet approved or cleared by the [place] FDA and  has been authorized for detection and/or diagnosis of SARS-CoV-2 by FDA under an Emergency Use Authorization (EUA). This EUA will remain  in effect (meaning this test can be used) for the duration of the COVID-19 declaration under Section 564(b)(1) of the Act, 21 U.S.C.section 360bbb-3(b)(1), unless the authorization is terminated  or revoked sooner.       Influenza A by PCR NEGATIVE NEGATIVE Final   Influenza B by PCR NEGATIVE NEGATIVE Final    Comment: (NOTE) The Xpert Xpress SARS-CoV-2/FLU/RSV plus assay is intended as an aid in the diagnosis of influenza from  Nasopharyngeal swab specimens and should not be used as a sole basis for treatment. Nasal washings and aspirates are unacceptable for Xpert Xpress SARS-CoV-2/FLU/RSV testing.  Fact Sheet for Patients: EntrepreneurPulse.com.au  Fact Sheet for Healthcare Providers: IncredibleEmployment.be  This test is not yet approved or cleared by the [place] FDA and has been authorized for detection and/or diagnosis of SARS-CoV-2 by FDA under an Emergency Use Authorization (EUA). This EUA will remain in effect (meaning this test can be used) for the duration of the COVID-19 declaration under Section 564(b)(1) of the Act, 21 U.S.C. section 360bbb-3(b)(1), unless the authorization is terminated or revoked.  Performed at [facility], [place] [address], [place], [place] [address]   MRSA PCR Screening     Status: None   Collection Time: 10/19/20  1:26 AM   Specimen: Nasopharyngeal  Result Value Ref Range Status   MRSA by PCR NEGATIVE NEGATIVE Final    Comment:        The GeneXpert MRSA Assay (FDA approved for NASAL specimens only), is one component of a comprehensive MRSA colonization surveillance program. It is not intended to diagnose MRSA infection nor to guide or monitor treatment for MRSA infections. Performed at [facility], [place] [address] [place],  [address]      Radiology Studies: No results found. CT ABDOMEN PELVIS WO CONTRAST  Final Result    CT Head Wo Contrast  Final Result    DG Chest Port 1 View  Final Result      Scheduled Meds: . chlorhexidine  15 mL Mouth Rinse BID  . Chlorhexidine Gluconate Cloth  6 each Topical Daily  . folic acid  1 mg Oral Daily  . lactulose  10 g Oral BID  . mouth rinse  15 mL Mouth Rinse q12n4p  . multivitamin with minerals  1 tablet Oral Daily  . pneumococcal 23 valent vaccine  0.5 mL Intramuscular Tomorrow-1000  . prednisoLONE  40 mg Oral Daily  .  thiamine  100 mg Oral Daily   Or  . thiamine  100 mg Intravenous Daily   PRN Meds: LORazepam **OR** LORazepam Continuous Infusions:   LOS: 3 days  Time spent: Greater than 50% of the 35 minute visit was spent in counseling/coordination of care for the patient as laid out in the A&P.   Dwyane Dee, MD [facility] 10/21/2020, 3:10 PM

## 2020-10-22 LAB — CBC WITH DIFFERENTIAL/PLATELET
Abs Immature Granulocytes: 0.27 10*3/uL — ABNORMAL HIGH (ref 0.00–0.07)
Basophils Absolute: 0 10*3/uL (ref 0.0–0.1)
Basophils Relative: 0 %
Eosinophils Absolute: 0 10*3/uL (ref 0.0–0.5)
Eosinophils Relative: 0 %
HCT: 21.1 % — ABNORMAL LOW (ref 36.0–46.0)
Hemoglobin: 7.9 g/dL — ABNORMAL LOW (ref 12.0–15.0)
Immature Granulocytes: 2 %
Lymphocytes Relative: 6 %
Lymphs Abs: 0.9 10*3/uL (ref 0.7–4.0)
MCH: 41.1 pg — ABNORMAL HIGH (ref 26.0–34.0)
MCHC: 37.4 g/dL — ABNORMAL HIGH (ref 30.0–36.0)
MCV: 109.9 fL — ABNORMAL HIGH (ref 80.0–100.0)
Monocytes Absolute: 1.1 10*3/uL — ABNORMAL HIGH (ref 0.1–1.0)
Monocytes Relative: 8 %
Neutro Abs: 11.3 10*3/uL — ABNORMAL HIGH (ref 1.7–7.7)
Neutrophils Relative %: 84 %
Platelets: 122 10*3/uL — ABNORMAL LOW (ref 150–400)
RBC: 1.92 MIL/uL — ABNORMAL LOW (ref 3.87–5.11)
RDW: 25.8 % — ABNORMAL HIGH (ref 11.5–15.5)
WBC: 13.6 10*3/uL — ABNORMAL HIGH (ref 4.0–10.5)
nRBC: 0.7 % — ABNORMAL HIGH (ref 0.0–0.2)

## 2020-10-22 LAB — PROTIME-INR
INR: 2.5 — ABNORMAL HIGH (ref 0.8–1.2)
Prothrombin Time: 26.8 seconds — ABNORMAL HIGH (ref 11.4–15.2)

## 2020-10-22 LAB — COMPREHENSIVE METABOLIC PANEL
ALT: 63 U/L — ABNORMAL HIGH (ref 0–44)
AST: 105 U/L — ABNORMAL HIGH (ref 15–41)
Albumin: 1.8 g/dL — ABNORMAL LOW (ref 3.5–5.0)
Alkaline Phosphatase: 120 U/L (ref 38–126)
Anion gap: 8 (ref 5–15)
BUN: 20 mg/dL (ref 6–20)
CO2: 22 mmol/L (ref 22–32)
Calcium: 7.8 mg/dL — ABNORMAL LOW (ref 8.9–10.3)
Chloride: 103 mmol/L (ref 98–111)
Creatinine, Ser: 0.71 mg/dL (ref 0.44–1.00)
GFR, Estimated: >60 mL/min (ref >60)
Glucose, Bld: 133 mg/dL — ABNORMAL HIGH (ref 70–99)
Potassium: 3 mmol/L — ABNORMAL LOW (ref 3.5–5.1)
Sodium: 133 mmol/L — ABNORMAL LOW (ref 135–145)
Total Bilirubin: 25 mg/dL (ref 0.3–1.2)
Total Protein: 6 g/dL — ABNORMAL LOW (ref 6.5–8.1)

## 2020-10-22 LAB — MAGNESIUM: Magnesium: 1.6 mg/dL — ABNORMAL LOW (ref 1.7–2.4)

## 2020-10-22 MED ORDER — POTASSIUM CHLORIDE CRYS ER 20 MEQ PO TBCR
40.0000 meq | EXTENDED_RELEASE_TABLET | ORAL | Status: AC
  Administered 2020-10-22 (×2): 40 meq via ORAL
  Filled 2020-10-22 (×2): qty 2

## 2020-10-22 MED ORDER — MAGNESIUM OXIDE -MG SUPPLEMENT 400 (240 MG) MG PO TABS
800.0000 mg | ORAL_TABLET | Freq: Once | ORAL | Status: AC
  Administered 2020-10-22: 800 mg via ORAL
  Filled 2020-10-22: qty 2

## 2020-10-22 NOTE — Plan of Care (Signed)

## 2020-10-22 NOTE — NC FL2 (Signed)
[place] LEVEL OF CARE SCREENING TOOL     IDENTIFICATION  Patient Name: Lori Oconnell Birthdate: [birth year] Sex: female Admission Date (Current Location): 10/18/2020  [facility] and [place] Number:  Herbalist and Address:  [facility],  [place], [place]      Provider Number: [id number]  Attending Physician Name and Address:  Dwyane Dee, MD  Relative Name and Phone Number:  Diego, Delancey   [phone]    Current Level of Care: Hospital Recommended Level of Care: [place] Prior Approval Number:    Date Approved/Denied:   PASRR Number: [id number]  Discharge Plan: SNF    Current Diagnoses: Patient Active Problem List   Diagnosis Date Noted  . Alcoholic hepatitis without ascites 10/20/2020  . Alcoholic cirrhosis of liver without ascites ([place]) 10/19/2020  . Supratherapeutic INR 10/19/2020  . Hyperbilirubinemia 10/19/2020  . Hyponatremia 10/19/2020  . AKI (acute kidney injury) ([place]) 10/19/2020  . Hepatic encephalopathy ([place]) 10/18/2020  . Allergic rhinitis 06/12/2019  . Allergic conjunctivitis 06/12/2019  . Abnormal LFTs 06/12/2019  . Poor dentition 05/15/2018  . Left ankle swelling 11/04/2015  . Rash 11/26/2014  . Hot flashes 07/02/2013  . Macrocytic anemia 12/10/2012  . 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 Depression with anxiety 11/09/2012  . 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 Mass on back 08/13/2012  . Lumbar radicular pain 10/08/2011  . Lipoma of neck 10/08/2011  . Neck pain 03/22/2011  . Edema 03/22/2011  . Irregular menses 03/22/2011  . Encounter for well adult exam with abnormal findings 03/19/2011  . HEMATOCHEZIA 11/30/2009  . Hyperlipidemia 07/18/2009  . [place] DISEASE, CERVICAL 07/13/2009  . Essential hypertension 07/12/2009  . RENAL INSUFFICIENCY 07/12/2009  . OVERACTIVE BLADDER 07/12/2009  . NECK PAIN 07/12/2009  . TRANSAMINASES, SERUM, ELEVATED 07/12/2009     Orientation RESPIRATION BLADDER Height & Weight     Self,Time,Place  Normal Continent Weight: 169 lb 12.1 oz (77 kg) Height:  5\' 6"  (167.6 cm)  BEHAVIORAL SYMPTOMS/MOOD NEUROLOGICAL BOWEL NUTRITION STATUS      Continent Diet  AMBULATORY STATUS COMMUNICATION OF NEEDS Skin   Limited Assist Verbally Normal                       Personal Care Assistance Level of Assistance  Bathing,Dressing Bathing Assistance: Limited assistance   Dressing Assistance: Limited assistance     Functional Limitations Info  Sight,Hearing,Speech Sight Info: Adequate Hearing Info: Adequate Speech Info: Adequate    SPECIAL CARE FACTORS FREQUENCY  PT (By licensed PT),OT (By licensed OT)     PT Frequency: Minimum 5x a week OT Frequency: Minimum 5x a week            Contractures Contractures Info: Not present    Additional Factors Info  Code Status,Allergies Code Status Info: Full Code Allergies Info: NKA           Current Medications (10/22/2020):  This is the current hospital active medication list Current Facility-Administered Medications  Medication Dose Route Frequency Provider Last Rate Last Admin  . chlorhexidine (PERIDEX) 0.12 % solution 15 mL  15 mL Mouth Rinse BID Dwyane Dee, MD   15 mL at 10/22/20 0919  . Chlorhexidine Gluconate Cloth 2 % PADS 6 each  6 each Topical Daily Dwyane Dee, MD   6 each at 10/21/20 [phone]  . folic acid (FOLVITE) tablet 1 mg  1 mg Oral Daily Dwyane Dee, MD   1 mg at 10/22/20 0919  .  lactulose (CHRONULAC) 10 GM/15ML solution 10 g  10 g Oral BID Dwyane Dee, MD   10 g at 10/22/20 0919  . MEDLINE mouth rinse  15 mL Mouth Rinse q12n4p Dwyane Dee, MD   15 mL at 10/22/20 1202  . multivitamin with minerals tablet 1 tablet  1 tablet Oral Daily Dwyane Dee, MD   1 tablet at 10/22/20 0919  . pneumococcal 23 valent vaccine (PNEUMOVAX-23) injection 0.5 mL  0.5 mL Intramuscular Tomorrow-1000 Tu, Ching T, DO      . prednisoLONE tablet 40 mg   40 mg Oral Daily Dwyane Dee, MD   40 mg at 10/22/20 1202  . thiamine tablet 100 mg  100 mg Oral Daily Dwyane Dee, MD   100 mg at 10/22/20 8882   Or  . thiamine (B-1) injection 100 mg  100 mg Intravenous Daily Dwyane Dee, MD         Discharge Medications: Please see discharge summary for a list of discharge medications.  Relevant Imaging Results:  Relevant Lab Results:   Additional Information SSN [id number]  Ross Ludwig, LCSW

## 2020-10-22 NOTE — TOC Progression Note (Signed)
Transition of Care [facility]) - Progression Note    Patient Details  Name: Lori Oconnell MRN: [id number] Date of Birth: [birth year]  [facility] CM/SW Contact  Ross Ludwig, [place] Phone Number: 10/22/2020, 4:40 PM  Clinical Narrative:     PT and OT are recommending SNF, however patient is currently refusing.  Patient stated she will think about SNF as a possibility for rehab.  Patient has a husband who works, patient's husband is concerned about patient being alone while he is at work.  Patient did agree to home health PT, OT and social work.  CSW provided choice of agencies, she did not have a preference, [facility] agreed to accept patient.  Patient expressed that she would rather go home.   CSW discussed the difference between going to SNF verse going home with home health.  CSW was encouraging patient to think about SNF placement.  Patient did give CSW permission to send her information to SNFs in case she changes her mind.  If patient decides on SNF, she will need a new Covid test and insurance authorization.  PT and OT recommended patient have 3 in 1 and a rolling walker, CSW contacted [employer], who will deliver DME to patient's room.  CSW to continue to follow patient's progress throughout discharge planning.   Expected Discharge Plan: Home/Self Care Barriers to Discharge: Continued Medical Work up  Expected Discharge Plan and Services Expected Discharge Plan: Home/Self Care   Discharge Planning Services: CM Consult   Living arrangements for the past 2 months: Single Family Home                                       Social Determinants of Health (SDOH) Interventions    Readmission Risk Interventions No flowsheet data found.

## 2020-10-22 NOTE — Progress Notes (Signed)
Progress Note    Akosua Constantine   ALP:[id number]  DOB: 1960-07-01  DOA: 10/18/2020     4  PCP: [name], MD  CC: AMS  Hospital Course: Lelaina Oatis a 60 y.o.femalewith medical history significant forheavy alcohol abuse, HTN, HLD who presentsdue to family concern for altered mental status. Patient alert and oriented only to self and place on admission.  Patient was reported to have been drinking more heavily prior to admission after recent stressors at home. She was on a drinking binge and averaging approximately 1 pint of liquor per day.  She was found on the floor by granddaughter prior to being brought to the hospital.  Interval History:  No events overnight.  Husband again present this morning.  Patient was slightly agitated and anxious for wanting to go home.  Discussed that she was recommended to consider rehab or at least home health.  She is wishing for home health.  We also discussed that medically she is not quite stable or ready for going home yet.  She was aggravated during this discussion but ultimately she voiced some understanding and for now I have informed her she will remain in the hospital until further stability of liver function. Husband was present and agreeable with the plan.  ROS: Constitutional: negative for chills and fevers, Respiratory: negative for cough, Cardiovascular: negative for chest pain and Gastrointestinal: negative for abdominal pain  Assessment & Plan: Acute hepatic encephalopathy - Ammonia level of 160 on 10/18/20 - continue lactulose -Mentation improved  Alcoholic cirrhosis Alcoholic hepatitis - small ascites seen on CT A/P on 10/18/20 - at risk for progressive liver failure; patient heavily counseled for alcohol cessation - Continue monitoring for signs of withdrawal -Discriminant function 96 points.  Given minimal improvement, will start on prednisolone for at least 1 week.  She will need repeat CMP, PT/INR to decide if warrants  continuing steroids for up to 28 days.  Macrocytic anemia - MCV 115 - folate low 4.3, B12 is 3532 -Continue folic acid and multivitamin - also likely chronic component 2/2 etoh abuse   Hypokalemia - Repleted, follow repeat labs  Hyponatremia -Na of 129 from dehydration and alcohol use - s/p repeltion  AKI - resolved  - Likely pre-renal - resolved with IVF  Old records reviewed in assessment of this patient  Antimicrobials:   DVT prophylaxis: SCDs Start: 10/18/20 2307   Code Status:   Code Status: Full Code Family Communication:   Disposition Plan: Status is: Inpatient  Remains inpatient appropriate because:Inpatient level of care appropriate due to severity of illness   Dispo: The patient is from: Home              Anticipated d/c is to: Home              Patient currently is not medically stable to d/c.   Difficult to place patient No  Risk of unplanned readmission score: Unplanned Admission- Pilot do not use: 11.72   Objective: Blood pressure 113/84, pulse 77, temperature (!) 97.5 F (36.4 C), temperature source Oral, resp. rate 18, height 5\' 6"  (1.676 m), weight 77 kg, last menstrual period 03/19/2011, SpO2 100 %.  Examination: General appearance: awake, alert, cooperative Head: Normocephalic, without obvious abnormality, atraumatic Eyes: scleral icterus appreciated Lungs: clear to auscultation bilaterally Heart: regular rate and rhythm and S1, S2 normal Abdomen: soft, NT, ND, BS present Extremities: no edema Skin: mobility and turgor normal Neurologic: Grossly normal.  No asterixis or tremor appreciated  Consultants:  Procedures:     Data Reviewed: I have personally reviewed following labs and imaging studies Results for orders placed or performed during the hospital encounter of 10/18/20 (from the past 24 hour(s))  Comprehensive metabolic panel     Status: Abnormal   Collection Time: 10/22/20  5:29 AM  Result Value Ref Range   Sodium  133 (L) 135 - 145 mmol/L   Potassium 3.0 (L) 3.5 - 5.1 mmol/L   Chloride 103 98 - 111 mmol/L   CO2 22 22 - 32 mmol/L   Glucose, Bld 133 (H) 70 - 99 mg/dL   BUN 20 6 - 20 mg/dL   Creatinine, Ser 5.400.71 0.44 - 1.00 mg/dL   Calcium 7.8 (L) 8.9 - 10.3 mg/dL   Total Protein 6.0 (L) 6.5 - 8.1 g/dL   Albumin 1.8 (L) 3.5 - 5.0 g/dL   AST 981105 (H) 15 - 41 U/L   ALT 63 (H) 0 - 44 U/L   Alkaline Phosphatase 120 38 - 126 U/L   Total Bilirubin 25.0 (HH) 0.3 - 1.2 mg/dL   GFR, Estimated >19>60 >14>60 mL/min   Anion gap 8 5 - 15  Protime-INR     Status: Abnormal   Collection Time: 10/22/20  5:29 AM  Result Value Ref Range   Prothrombin Time 26.8 (H) 11.4 - 15.2 seconds   INR 2.5 (H) 0.8 - 1.2  CBC with Differential/Platelet     Status: Abnormal   Collection Time: 10/22/20  5:29 AM  Result Value Ref Range   WBC 13.6 (H) 4.0 - 10.5 K/uL   RBC 1.92 (L) 3.87 - 5.11 MIL/uL   Hemoglobin 7.9 (L) 12.0 - 15.0 g/dL   HCT 78.221.1 (L) 95.636.0 - 21.346.0 %   MCV 109.9 (H) 80.0 - 100.0 fL   MCH 41.1 (H) 26.0 - 34.0 pg   MCHC 37.4 (H) 30.0 - 36.0 g/dL   RDW 08.625.8 (H) 57.811.5 - 46.915.5 %   Platelets 122 (L) 150 - 400 K/uL   nRBC 0.7 (H) 0.0 - 0.2 %   Neutrophils Relative % 84 %   Neutro Abs 11.3 (H) 1.7 - 7.7 K/uL   Lymphocytes Relative 6 %   Lymphs Abs 0.9 0.7 - 4.0 K/uL   Monocytes Relative 8 %   Monocytes Absolute 1.1 (H) 0.1 - 1.0 K/uL   Eosinophils Relative 0 %   Eosinophils Absolute 0.0 0.0 - 0.5 K/uL   Basophils Relative 0 %   Basophils Absolute 0.0 0.0 - 0.1 K/uL   Immature Granulocytes 2 %   Abs Immature Granulocytes 0.27 (H) 0.00 - 0.07 K/uL   Polychromasia PRESENT    Target Cells PRESENT   Magnesium     Status: Abnormal   Collection Time: 10/22/20  5:29 AM  Result Value Ref Range   Magnesium 1.6 (L) 1.7 - 2.4 mg/dL    Recent Results (from the past 240 hour(s))  Resp Panel by RT-PCR (Flu A&B, Covid) Nasopharyngeal Swab     Status: None   Collection Time: 10/18/20 10:04 PM   Specimen: Nasopharyngeal Swab;  Nasopharyngeal(NP) swabs in vial transport medium  Result Value Ref Range Status   SARS Coronavirus 2 by RT PCR NEGATIVE NEGATIVE Final    Comment: (NOTE) SARS-CoV-2 target nucleic acids are NOT DETECTED.  The SARS-CoV-2 RNA is generally detectable in upper respiratory specimens during the acute phase of infection. The lowest concentration of SARS-CoV-2 viral copies this assay can detect is 138 copies/mL. A negative result does not preclude SARS-Cov-2 infection and  should not be used as the sole basis for treatment or other patient management decisions. A negative result may occur with  improper specimen collection/handling, submission of specimen other than nasopharyngeal swab, presence of viral mutation(s) within the areas targeted by this assay, and inadequate number of viral copies(<138 copies/mL). A negative result must be combined with clinical observations, patient history, and epidemiological information. The expected result is Negative.  Fact Sheet for Patients:  EntrepreneurPulse.com.au  Fact Sheet for Healthcare Providers:  IncredibleEmployment.be  This test is no t yet approved or cleared by the [place] FDA and  has been authorized for detection and/or diagnosis of SARS-CoV-2 by FDA under an Emergency Use Authorization (EUA). This EUA will remain  in effect (meaning this test can be used) for the duration of the COVID-19 declaration under Section 564(b)(1) of the Act, 21 U.S.C.section 360bbb-3(b)(1), unless the authorization is terminated  or revoked sooner.       Influenza A by PCR NEGATIVE NEGATIVE Final   Influenza B by PCR NEGATIVE NEGATIVE Final    Comment: (NOTE) The Xpert Xpress SARS-CoV-2/FLU/RSV plus assay is intended as an aid in the diagnosis of influenza from Nasopharyngeal swab specimens and should not be used as a sole basis for treatment. Nasal washings and aspirates are unacceptable for Xpert Xpress  SARS-CoV-2/FLU/RSV testing.  Fact Sheet for Patients: EntrepreneurPulse.com.au  Fact Sheet for Healthcare Providers: IncredibleEmployment.be  This test is not yet approved or cleared by the [place] FDA and has been authorized for detection and/or diagnosis of SARS-CoV-2 by FDA under an Emergency Use Authorization (EUA). This EUA will remain in effect (meaning this test can be used) for the duration of the COVID-19 declaration under Section 564(b)(1) of the Act, 21 U.S.C. section 360bbb-3(b)(1), unless the authorization is terminated or revoked.  Performed at [facility], [place] [address] [place], [place] [address]   MRSA PCR Screening     Status: None   Collection Time: 10/19/20  1:26 AM   Specimen: Nasopharyngeal  Result Value Ref Range Status   MRSA by PCR NEGATIVE NEGATIVE Final    Comment:        The GeneXpert MRSA Assay (FDA approved for NASAL specimens only), is one component of a comprehensive MRSA colonization surveillance program. It is not intended to diagnose MRSA infection nor to guide or monitor treatment for MRSA infections. Performed at [facility], [place] [address], [place], [place] [address]      Radiology Studies: No results found. CT ABDOMEN PELVIS WO CONTRAST  Final Result    CT Head Wo Contrast  Final Result    DG Chest Port 1 View  Final Result      Scheduled Meds: . chlorhexidine  15 mL Mouth Rinse BID  . Chlorhexidine Gluconate Cloth  6 each Topical Daily  . folic acid  1 mg Oral Daily  . lactulose  10 g Oral BID  . mouth rinse  15 mL Mouth Rinse q12n4p  . multivitamin with minerals  1 tablet Oral Daily  . pneumococcal 23 valent vaccine  0.5 mL Intramuscular Tomorrow-1000  . prednisoLONE  40 mg Oral Daily  . thiamine  100 mg Oral Daily   Or  . thiamine  100 mg Intravenous Daily   PRN Meds:  Continuous Infusions:   LOS: 4 days  Time spent: Greater  than 50% of the 35 minute visit was spent in counseling/coordination of care for the patient as laid out in the A&P.   Shanon Brow  Analayah Brooke, MD [facility] 10/22/2020, 3:16 PM

## 2020-10-23 DIAGNOSIS — D539 Nutritional anemia, unspecified: Secondary | ICD-10-CM

## 2020-10-23 LAB — CBC WITH DIFFERENTIAL/PLATELET
Abs Immature Granulocytes: 0.19 10*3/uL — ABNORMAL HIGH (ref 0.00–0.07)
Basophils Absolute: 0 10*3/uL (ref 0.0–0.1)
Basophils Relative: 0 %
Eosinophils Absolute: 0 10*3/uL (ref 0.0–0.5)
Eosinophils Relative: 0 %
HCT: 20.8 % — ABNORMAL LOW (ref 36.0–46.0)
Hemoglobin: 7.5 g/dL — ABNORMAL LOW (ref 12.0–15.0)
Immature Granulocytes: 2 %
Lymphocytes Relative: 7 %
Lymphs Abs: 0.8 10*3/uL (ref 0.7–4.0)
MCH: 40.5 pg — ABNORMAL HIGH (ref 26.0–34.0)
MCHC: 36.1 g/dL — ABNORMAL HIGH (ref 30.0–36.0)
MCV: 112.4 fL — ABNORMAL HIGH (ref 80.0–100.0)
Monocytes Absolute: 1.3 10*3/uL — ABNORMAL HIGH (ref 0.1–1.0)
Monocytes Relative: 10 %
Neutro Abs: 10.6 10*3/uL — ABNORMAL HIGH (ref 1.7–7.7)
Neutrophils Relative %: 81 %
Platelets: 120 10*3/uL — ABNORMAL LOW (ref 150–400)
RBC: 1.85 MIL/uL — ABNORMAL LOW (ref 3.87–5.11)
RDW: 25.8 % — ABNORMAL HIGH (ref 11.5–15.5)
WBC: 13 10*3/uL — ABNORMAL HIGH (ref 4.0–10.5)
nRBC: 0.5 % — ABNORMAL HIGH (ref 0.0–0.2)

## 2020-10-23 LAB — COMPREHENSIVE METABOLIC PANEL
ALT: 62 U/L — ABNORMAL HIGH (ref 0–44)
AST: 109 U/L — ABNORMAL HIGH (ref 15–41)
Albumin: 1.8 g/dL — ABNORMAL LOW (ref 3.5–5.0)
Alkaline Phosphatase: 132 U/L — ABNORMAL HIGH (ref 38–126)
Anion gap: 8 (ref 5–15)
BUN: 24 mg/dL — ABNORMAL HIGH (ref 6–20)
CO2: 22 mmol/L (ref 22–32)
Calcium: 8.1 mg/dL — ABNORMAL LOW (ref 8.9–10.3)
Chloride: 104 mmol/L (ref 98–111)
Creatinine, Ser: 0.93 mg/dL (ref 0.44–1.00)
GFR, Estimated: >60 mL/min (ref >60)
Glucose, Bld: 127 mg/dL — ABNORMAL HIGH (ref 70–99)
Potassium: 3.2 mmol/L — ABNORMAL LOW (ref 3.5–5.1)
Sodium: 134 mmol/L — ABNORMAL LOW (ref 135–145)
Total Bilirubin: 24.3 mg/dL (ref 0.3–1.2)
Total Protein: 5.9 g/dL — ABNORMAL LOW (ref 6.5–8.1)

## 2020-10-23 LAB — PROTIME-INR
INR: 2.4 — ABNORMAL HIGH (ref 0.8–1.2)
Prothrombin Time: 25.8 seconds — ABNORMAL HIGH (ref 11.4–15.2)

## 2020-10-23 LAB — AMMONIA: Ammonia: 144 umol/L — ABNORMAL HIGH (ref 9–35)

## 2020-10-23 LAB — MAGNESIUM: Magnesium: 1.6 mg/dL — ABNORMAL LOW (ref 1.7–2.4)

## 2020-10-23 MED ORDER — POTASSIUM CHLORIDE CRYS ER 20 MEQ PO TBCR
40.0000 meq | EXTENDED_RELEASE_TABLET | Freq: Once | ORAL | Status: AC
  Administered 2020-10-23: 40 meq via ORAL
  Filled 2020-10-23: qty 2

## 2020-10-23 MED ORDER — LACTULOSE 10 GM/15ML PO SOLN
20.0000 g | Freq: Two times a day (BID) | ORAL | Status: DC
  Administered 2020-10-23 – 2020-10-25 (×5): 20 g via ORAL
  Filled 2020-10-23 (×5): qty 30

## 2020-10-23 MED ORDER — MAGNESIUM OXIDE -MG SUPPLEMENT 400 (240 MG) MG PO TABS
800.0000 mg | ORAL_TABLET | Freq: Once | ORAL | Status: AC
  Administered 2020-10-23: 800 mg via ORAL
  Filled 2020-10-23: qty 2

## 2020-10-23 NOTE — Progress Notes (Signed)
Progress Note    Lori Oconnell   IZT:[id number]  DOB: [birth year]  DOA: 10/18/2020     5  PCP: [name], MD  CC: AMS  Hospital Course: Lori Oconnell a 60 y.o.femalewith medical history significant forheavy alcohol abuse, HTN, HLD who presentsdue to family concern for altered mental status. Patient alert and oriented only to self and place on admission.  Patient was reported to have been drinking more heavily prior to admission after recent stressors at home. She was on a drinking binge and averaging approximately 1 pint of liquor per day.  She was found on the floor by granddaughter prior to being brought to the hospital.  Interval History:  No events overnight.  Mentation appears further improved.  Energy also starting to get a little better each day.  Walked some in her room she says with therapy.  Husband present this morning as well. Tentative plan at this time appears to be pursuing short-term rehab which she is now okay with; previously she had wanted to just go home with home health.  ROS: Constitutional: negative for chills and fevers, Respiratory: negative for cough, Cardiovascular: negative for chest pain and Gastrointestinal: negative for abdominal pain  Assessment & Plan: Acute hepatic encephalopathy - Ammonia level of 160 on 10/18/20; repeated on 10/23/20, 144 - continue lactulose -Mentation improved  Alcoholic cirrhosis Alcoholic hepatitis - small ascites seen on CT A/P on 10/18/20 - at risk for progressive liver failure; patient heavily counseled for alcohol cessation - no s/s withdrawal; d/c CIWA - Discriminant function 96 points.  Given minimal improvement, will start on prednisolone for at least 1 week.  She will need repeat CMP, PT/INR to decide if warrants continuing steroids for up to 28 days.  Macrocytic anemia - MCV 115 - folate low 4.3, B12 is 3825 -Continue folic acid and multivitamin - also likely chronic component 2/2 etoh abuse    Hypokalemia - Repleted, follow repeat labs  Hyponatremia - improving  -Na of 129 from dehydration and alcohol use - s/p repletion  AKI - resolved  - Likely pre-renal - resolved with IVF  Old records reviewed in assessment of this patient  Antimicrobials:   DVT prophylaxis: SCDs Start: 10/18/20 2307   Code Status:   Code Status: Full Code Family Communication:   Disposition Plan: Status is: Inpatient  Remains inpatient appropriate because:Inpatient level of care appropriate due to severity of illness   Dispo: The patient is from: Home              Anticipated d/c is to: Home vs rehab              Patient currently is not medically stable to d/c.   Difficult to place patient No  Risk of unplanned readmission score: Unplanned Admission- Pilot do not use: 13.49   Objective: Blood pressure 107/71, pulse 90, temperature 98.2 F (36.8 C), temperature source Oral, resp. rate 20, height 5\' 6"  (1.676 m), weight 77 kg, last menstrual period 03/19/2011, SpO2 98 %.  Examination: General appearance: awake, alert, cooperative Head: Normocephalic, without obvious abnormality, atraumatic Eyes: scleral icterus appreciated Lungs: clear to auscultation bilaterally Heart: regular rate and rhythm and S1, S2 normal Abdomen: soft, NT, ND, BS present Extremities: no edema Skin: mobility and turgor normal Neurologic: Grossly normal.  No asterixis or tremor appreciated  Consultants:     Procedures:     Data Reviewed: I have personally reviewed following labs and imaging studies Results for orders placed or performed during the  hospital encounter of 10/18/20 (from the past 24 hour(s))  Comprehensive metabolic panel     Status: Abnormal   Collection Time: 10/23/20  4:56 AM  Result Value Ref Range   Sodium 134 (L) 135 - 145 mmol/L   Potassium 3.2 (L) 3.5 - 5.1 mmol/L   Chloride 104 98 - 111 mmol/L   CO2 22 22 - 32 mmol/L   Glucose, Bld 127 (H) 70 - 99 mg/dL   BUN 24 (H) 6  - 20 mg/dL   Creatinine, Ser 0.93 0.44 - 1.00 mg/dL   Calcium 8.1 (L) 8.9 - 10.3 mg/dL   Total Protein 5.9 (L) 6.5 - 8.1 g/dL   Albumin 1.8 (L) 3.5 - 5.0 g/dL   AST 109 (H) 15 - 41 U/L   ALT 62 (H) 0 - 44 U/L   Alkaline Phosphatase 132 (H) 38 - 126 U/L   Total Bilirubin 24.3 (HH) 0.3 - 1.2 mg/dL   GFR, Estimated >60 >60 mL/min   Anion gap 8 5 - 15  Protime-INR     Status: Abnormal   Collection Time: 10/23/20  4:56 AM  Result Value Ref Range   Prothrombin Time 25.8 (H) 11.4 - 15.2 seconds   INR 2.4 (H) 0.8 - 1.2  CBC with Differential/Platelet     Status: Abnormal   Collection Time: 10/23/20  4:56 AM  Result Value Ref Range   WBC 13.0 (H) 4.0 - 10.5 K/uL   RBC 1.85 (L) 3.87 - 5.11 MIL/uL   Hemoglobin 7.5 (L) 12.0 - 15.0 g/dL   HCT 20.8 (L) 36.0 - 46.0 %   MCV 112.4 (H) 80.0 - 100.0 fL   MCH 40.5 (H) 26.0 - 34.0 pg   MCHC 36.1 (H) 30.0 - 36.0 g/dL   RDW 25.8 (H) 11.5 - 15.5 %   Platelets 120 (L) 150 - 400 K/uL   nRBC 0.5 (H) 0.0 - 0.2 %   Neutrophils Relative % 81 %   Neutro Abs 10.6 (H) 1.7 - 7.7 K/uL   Lymphocytes Relative 7 %   Lymphs Abs 0.8 0.7 - 4.0 K/uL   Monocytes Relative 10 %   Monocytes Absolute 1.3 (H) 0.1 - 1.0 K/uL   Eosinophils Relative 0 %   Eosinophils Absolute 0.0 0.0 - 0.5 K/uL   Basophils Relative 0 %   Basophils Absolute 0.0 0.0 - 0.1 K/uL   Immature Granulocytes 2 %   Abs Immature Granulocytes 0.19 (H) 0.00 - 0.07 K/uL   Target Cells PRESENT    Giant PLTs PRESENT   Magnesium     Status: Abnormal   Collection Time: 10/23/20  4:56 AM  Result Value Ref Range   Magnesium 1.6 (L) 1.7 - 2.4 mg/dL  Ammonia     Status: Abnormal   Collection Time: 10/23/20  4:56 AM  Result Value Ref Range   Ammonia 144 (H) 9 - 35 umol/L    Recent Results (from the past 240 hour(s))  Resp Panel by RT-PCR (Flu A&B, Covid) Nasopharyngeal Swab     Status: None   Collection Time: 10/18/20 10:04 PM   Specimen: Nasopharyngeal Swab; Nasopharyngeal(NP) swabs in vial transport  medium  Result Value Ref Range Status   SARS Coronavirus 2 by RT PCR NEGATIVE NEGATIVE Final    Comment: (NOTE) SARS-CoV-2 target nucleic acids are NOT DETECTED.  The SARS-CoV-2 RNA is generally detectable in upper respiratory specimens during the acute phase of infection. The lowest concentration of SARS-CoV-2 viral copies this assay can detect is 138 copies/mL.  A negative result does not preclude SARS-Cov-2 infection and should not be used as the sole basis for treatment or other patient management decisions. A negative result may occur with  improper specimen collection/handling, submission of specimen other than nasopharyngeal swab, presence of viral mutation(s) within the areas targeted by this assay, and inadequate number of viral copies(<138 copies/mL). A negative result must be combined with clinical observations, patient history, and epidemiological information. The expected result is Negative.  Fact Sheet for Patients:  EntrepreneurPulse.com.au  Fact Sheet for Healthcare Providers:  IncredibleEmployment.be  This test is no t yet approved or cleared by the [place] FDA and  has been authorized for detection and/or diagnosis of SARS-CoV-2 by FDA under an Emergency Use Authorization (EUA). This EUA will remain  in effect (meaning this test can be used) for the duration of the COVID-19 declaration under Section 564(b)(1) of the Act, 21 U.S.C.section 360bbb-3(b)(1), unless the authorization is terminated  or revoked sooner.       Influenza A by PCR NEGATIVE NEGATIVE Final   Influenza B by PCR NEGATIVE NEGATIVE Final    Comment: (NOTE) The Xpert Xpress SARS-CoV-2/FLU/RSV plus assay is intended as an aid in the diagnosis of influenza from Nasopharyngeal swab specimens and should not be used as a sole basis for treatment. Nasal washings and aspirates are unacceptable for Xpert Xpress SARS-CoV-2/FLU/RSV testing.  Fact Sheet for  Patients: EntrepreneurPulse.com.au  Fact Sheet for Healthcare Providers: IncredibleEmployment.be  This test is not yet approved or cleared by the [place] FDA and has been authorized for detection and/or diagnosis of SARS-CoV-2 by FDA under an Emergency Use Authorization (EUA). This EUA will remain in effect (meaning this test can be used) for the duration of the COVID-19 declaration under Section 564(b)(1) of the Act, 21 U.S.C. section 360bbb-3(b)(1), unless the authorization is terminated or revoked.  Performed at [facility], [place] [address], [place], [place] [address]   MRSA PCR Screening     Status: None   Collection Time: 10/19/20  1:26 AM   Specimen: Nasopharyngeal  Result Value Ref Range Status   MRSA by PCR NEGATIVE NEGATIVE Final    Comment:        The GeneXpert MRSA Assay (FDA approved for NASAL specimens only), is one component of a comprehensive MRSA colonization surveillance program. It is not intended to diagnose MRSA infection nor to guide or monitor treatment for MRSA infections. Performed at [facility], [place] [address] [place],  [address]      Radiology Studies: No results found. CT ABDOMEN PELVIS WO CONTRAST  Final Result    CT Head Wo Contrast  Final Result    DG Chest Port 1 View  Final Result      Scheduled Meds: . chlorhexidine  15 mL Mouth Rinse BID  . Chlorhexidine Gluconate Cloth  6 each Topical Daily  . folic acid  1 mg Oral Daily  . lactulose  20 g Oral BID  . mouth rinse  15 mL Mouth Rinse q12n4p  . multivitamin with minerals  1 tablet Oral Daily  . pneumococcal 23 valent vaccine  0.5 mL Intramuscular Tomorrow-1000  . prednisoLONE  40 mg Oral Daily  . thiamine  100 mg Oral Daily   Or  . thiamine  100 mg Intravenous Daily   PRN Meds:  Continuous Infusions:   LOS: 5 days  Time spent: Greater than 50% of the 35 minute visit was spent in  counseling/coordination of care for the patient  as laid out in the A&P.   Dwyane Dee, MD [facility] 10/23/2020, 1:54 PM

## 2020-10-24 LAB — CBC WITH DIFFERENTIAL/PLATELET
Abs Immature Granulocytes: 0.29 10*3/uL — ABNORMAL HIGH (ref 0.00–0.07)
Basophils Absolute: 0 10*3/uL (ref 0.0–0.1)
Basophils Relative: 0 %
Eosinophils Absolute: 0 10*3/uL (ref 0.0–0.5)
Eosinophils Relative: 0 %
HCT: 20.2 % — ABNORMAL LOW (ref 36.0–46.0)
Hemoglobin: 7.4 g/dL — ABNORMAL LOW (ref 12.0–15.0)
Immature Granulocytes: 2 %
Lymphocytes Relative: 6 %
Lymphs Abs: 0.8 10*3/uL (ref 0.7–4.0)
MCH: 40.9 pg — ABNORMAL HIGH (ref 26.0–34.0)
MCHC: 36.6 g/dL — ABNORMAL HIGH (ref 30.0–36.0)
MCV: 111.6 fL — ABNORMAL HIGH (ref 80.0–100.0)
Monocytes Absolute: 1.2 10*3/uL — ABNORMAL HIGH (ref 0.1–1.0)
Monocytes Relative: 10 %
Neutro Abs: 10 10*3/uL — ABNORMAL HIGH (ref 1.7–7.7)
Neutrophils Relative %: 82 %
Platelets: 120 10*3/uL — ABNORMAL LOW (ref 150–400)
RBC: 1.81 MIL/uL — ABNORMAL LOW (ref 3.87–5.11)
RDW: 25.6 % — ABNORMAL HIGH (ref 11.5–15.5)
WBC: 12.3 10*3/uL — ABNORMAL HIGH (ref 4.0–10.5)
nRBC: 0.4 % — ABNORMAL HIGH (ref 0.0–0.2)

## 2020-10-24 LAB — MAGNESIUM: Magnesium: 1.7 mg/dL (ref 1.7–2.4)

## 2020-10-24 LAB — COMPREHENSIVE METABOLIC PANEL
ALT: 71 U/L — ABNORMAL HIGH (ref 0–44)
AST: 118 U/L — ABNORMAL HIGH (ref 15–41)
Albumin: 1.8 g/dL — ABNORMAL LOW (ref 3.5–5.0)
Alkaline Phosphatase: 136 U/L — ABNORMAL HIGH (ref 38–126)
Anion gap: 8 (ref 5–15)
BUN: 29 mg/dL — ABNORMAL HIGH (ref 6–20)
CO2: 22 mmol/L (ref 22–32)
Calcium: 8.5 mg/dL — ABNORMAL LOW (ref 8.9–10.3)
Chloride: 107 mmol/L (ref 98–111)
Creatinine, Ser: 0.85 mg/dL (ref 0.44–1.00)
GFR, Estimated: >60 mL/min (ref >60)
Glucose, Bld: 132 mg/dL — ABNORMAL HIGH (ref 70–99)
Potassium: 3.2 mmol/L — ABNORMAL LOW (ref 3.5–5.1)
Sodium: 137 mmol/L (ref 135–145)
Total Bilirubin: 23.9 mg/dL (ref 0.3–1.2)
Total Protein: 5.9 g/dL — ABNORMAL LOW (ref 6.5–8.1)

## 2020-10-24 LAB — PROTIME-INR
INR: 2.2 — ABNORMAL HIGH (ref 0.8–1.2)
Prothrombin Time: 24.7 seconds — ABNORMAL HIGH (ref 11.4–15.2)

## 2020-10-24 NOTE — Plan of Care (Signed)
  Problem: Education: Goal: Knowledge of General Education information will improve Description: Including pain rating scale, medication(s)/side effects and non-pharmacologic comfort measures Outcome: Progressing   Problem: Clinical Measurements: Goal: Diagnostic test results will improve Outcome: Progressing   Problem: Activity: Goal: Risk for activity intolerance will decrease Outcome: Progressing   

## 2020-10-24 NOTE — Progress Notes (Signed)
Progress Note    Lori Oconnell   XFG:[id number]  DOB: [birth year]  DOA: 10/18/2020     6  PCP: [name], MD  CC: AMS  Hospital Course: Lori Oconnell a 60 y.o.femalewith medical history significant forheavy alcohol abuse, HTN, HLD who presentsdue to family concern for altered mental status. Patient alert and oriented only to self and place on admission.  Patient was reported to have been drinking more heavily prior to admission after recent stressors at home. She was on a drinking binge and averaging approximately 1 pint of liquor per day.  She was found on the floor by granddaughter prior to being brought to the hospital.  Interval History:  No events overnight.  Husband present bedside this morning.  Mentation remains improved and intact.  Still having ongoing bowel movements from lactulose.  Energy also improving and she has been up and ambulating more. We discussed seriousness again of her liver injury and also talked with her daughter on the phone while in the room this morning.  Plan remains for pursuing discharge to rehab early this week.  ROS: Constitutional: negative for chills and fevers, Respiratory: negative for cough, Cardiovascular: negative for chest pain and Gastrointestinal: negative for abdominal pain  Assessment & Plan: Acute hepatic encephalopathy - Ammonia level of 160 on 10/18/20; repeated on 10/23/20, 144 - continue lactulose -Mentation improved  Alcoholic cirrhosis Alcoholic hepatitis - small ascites seen on CT A/P on 10/18/20 - at risk for progressive liver failure; patient heavily counseled for alcohol cessation - no s/s withdrawal; d/c CIWA MELD-Na score: 27 at 10/24/2020  4:24 AM MELD score: 27 at 10/24/2020  4:24 AM Calculated from: Serum Creatinine: 0.85 mg/dL (Using min of 1 mg/dL) at 10/24/2020  4:24 AM Serum Sodium: 137 mmol/L at 10/24/2020  4:24 AM Total Bilirubin: 23.9 mg/dL at 10/24/2020  4:24 AM INR(ratio): 2.2 at 10/24/2020  4:24 AM Age: 60  years - Discriminant function 96 points.  Given minimal improvement, will start on prednisolone for at least 1 week.  She will need repeat CMP, PT/INR to decide if warrants continuing steroids for up to 28 days. - discussed with GI, also in agreement with steroids; no indication for liver transplant eval at this time; will need close outpatient follow up after discharge for ongoing management  Macrocytic anemia - MCV 115 - folate low 4.3, B12 is 9678 -Continue folic acid and multivitamin - also likely chronic component 2/2 etoh abuse   Hypokalemia - Repleted, follow repeat labs  Hyponatremia - improving  -Na of 129 from dehydration and alcohol use - s/p repletion  AKI - resolved  - Likely pre-renal - resolved with IVF  Old records reviewed in assessment of this patient  Antimicrobials:   DVT prophylaxis: SCDs Start: 10/18/20 2307   Code Status:   Code Status: Full Code Family Communication: husband  Disposition Plan: Status is: Inpatient  Remains inpatient appropriate because:Inpatient level of care appropriate due to severity of illness   Dispo: The patient is from: Home              Anticipated d/c is to: Home vs rehab              Patient currently is not medically stable to d/c.   Difficult to place patient No  Risk of unplanned readmission score: Unplanned Admission- Pilot do not use: 13.69   Objective: Blood pressure 116/71, pulse 72, temperature (!) 97.4 F (36.3 C), temperature source Oral, resp. rate 18, height 5\' 6"  (1.676  m), weight 77 kg, last menstrual period 03/19/2011, SpO2 100 %.  Examination: General appearance: awake, alert, cooperative Head: Normocephalic, without obvious abnormality, atraumatic Eyes: scleral icterus appreciated Lungs: clear to auscultation bilaterally Heart: regular rate and rhythm and S1, S2 normal Abdomen: soft, NT, ND, BS present Extremities: 1+ RLE edema Skin: mobility and turgor normal Neurologic: Grossly normal.   No asterixis or tremor appreciated  Consultants:     Procedures:     Data Reviewed: I have personally reviewed following labs and imaging studies Results for orders placed or performed during the hospital encounter of 10/18/20 (from the past 24 hour(s))  Comprehensive metabolic panel     Status: Abnormal   Collection Time: 10/24/20  4:24 AM  Result Value Ref Range   Sodium 137 135 - 145 mmol/L   Potassium 3.2 (L) 3.5 - 5.1 mmol/L   Chloride 107 98 - 111 mmol/L   CO2 22 22 - 32 mmol/L   Glucose, Bld 132 (H) 70 - 99 mg/dL   BUN 29 (H) 6 - 20 mg/dL   Creatinine, Ser 0.85 0.44 - 1.00 mg/dL   Calcium 8.5 (L) 8.9 - 10.3 mg/dL   Total Protein 5.9 (L) 6.5 - 8.1 g/dL   Albumin 1.8 (L) 3.5 - 5.0 g/dL   AST 118 (H) 15 - 41 U/L   ALT 71 (H) 0 - 44 U/L   Alkaline Phosphatase 136 (H) 38 - 126 U/L   Total Bilirubin 23.9 (HH) 0.3 - 1.2 mg/dL   GFR, Estimated >60 >60 mL/min   Anion gap 8 5 - 15  Protime-INR     Status: Abnormal   Collection Time: 10/24/20  4:24 AM  Result Value Ref Range   Prothrombin Time 24.7 (H) 11.4 - 15.2 seconds   INR 2.2 (H) 0.8 - 1.2  CBC with Differential/Platelet     Status: Abnormal   Collection Time: 10/24/20  4:24 AM  Result Value Ref Range   WBC 12.3 (H) 4.0 - 10.5 K/uL   RBC 1.81 (L) 3.87 - 5.11 MIL/uL   Hemoglobin 7.4 (L) 12.0 - 15.0 g/dL   HCT 20.2 (L) 36.0 - 46.0 %   MCV 111.6 (H) 80.0 - 100.0 fL   MCH 40.9 (H) 26.0 - 34.0 pg   MCHC 36.6 (H) 30.0 - 36.0 g/dL   RDW 25.6 (H) 11.5 - 15.5 %   Platelets 120 (L) 150 - 400 K/uL   nRBC 0.4 (H) 0.0 - 0.2 %   Neutrophils Relative % 82 %   Neutro Abs 10.0 (H) 1.7 - 7.7 K/uL   Lymphocytes Relative 6 %   Lymphs Abs 0.8 0.7 - 4.0 K/uL   Monocytes Relative 10 %   Monocytes Absolute 1.2 (H) 0.1 - 1.0 K/uL   Eosinophils Relative 0 %   Eosinophils Absolute 0.0 0.0 - 0.5 K/uL   Basophils Relative 0 %   Basophils Absolute 0.0 0.0 - 0.1 K/uL   Immature Granulocytes 2 %   Abs Immature Granulocytes 0.29 (H) 0.00  - 0.07 K/uL   Target Cells PRESENT   Magnesium     Status: None   Collection Time: 10/24/20  4:24 AM  Result Value Ref Range   Magnesium 1.7 1.7 - 2.4 mg/dL    Recent Results (from the past 240 hour(s))  Resp Panel by RT-PCR (Flu A&B, Covid) Nasopharyngeal Swab     Status: None   Collection Time: 10/18/20 10:04 PM   Specimen: Nasopharyngeal Swab; Nasopharyngeal(NP) swabs in vial transport medium  Result  Value Ref Range Status   SARS Coronavirus 2 by RT PCR NEGATIVE NEGATIVE Final    Comment: (NOTE) SARS-CoV-2 target nucleic acids are NOT DETECTED.  The SARS-CoV-2 RNA is generally detectable in upper respiratory specimens during the acute phase of infection. The lowest concentration of SARS-CoV-2 viral copies this assay can detect is 138 copies/mL. A negative result does not preclude SARS-Cov-2 infection and should not be used as the sole basis for treatment or other patient management decisions. A negative result may occur with  improper specimen collection/handling, submission of specimen other than nasopharyngeal swab, presence of viral mutation(s) within the areas targeted by this assay, and inadequate number of viral copies(<138 copies/mL). A negative result must be combined with clinical observations, patient history, and epidemiological information. The expected result is Negative.  Fact Sheet for Patients:  EntrepreneurPulse.com.au  Fact Sheet for Healthcare Providers:  IncredibleEmployment.be  This test is no t yet approved or cleared by the [place] FDA and  has been authorized for detection and/or diagnosis of SARS-CoV-2 by FDA under an Emergency Use Authorization (EUA). This EUA will remain  in effect (meaning this test can be used) for the duration of the COVID-19 declaration under Section 564(b)(1) of the Act, 21 U.S.C.section 360bbb-3(b)(1), unless the authorization is terminated  or revoked sooner.       Influenza A  by PCR NEGATIVE NEGATIVE Final   Influenza B by PCR NEGATIVE NEGATIVE Final    Comment: (NOTE) The Xpert Xpress SARS-CoV-2/FLU/RSV plus assay is intended as an aid in the diagnosis of influenza from Nasopharyngeal swab specimens and should not be used as a sole basis for treatment. Nasal washings and aspirates are unacceptable for Xpert Xpress SARS-CoV-2/FLU/RSV testing.  Fact Sheet for Patients: EntrepreneurPulse.com.au  Fact Sheet for Healthcare Providers: IncredibleEmployment.be  This test is not yet approved or cleared by the [place] FDA and has been authorized for detection and/or diagnosis of SARS-CoV-2 by FDA under an Emergency Use Authorization (EUA). This EUA will remain in effect (meaning this test can be used) for the duration of the COVID-19 declaration under Section 564(b)(1) of the Act, 21 U.S.C. section 360bbb-3(b)(1), unless the authorization is terminated or revoked.  Performed at [facility], [place] [address], [place], [place] [address]   MRSA PCR Screening     Status: None   Collection Time: 10/19/20  1:26 AM   Specimen: Nasopharyngeal  Result Value Ref Range Status   MRSA by PCR NEGATIVE NEGATIVE Final    Comment:        The GeneXpert MRSA Assay (FDA approved for NASAL specimens only), is one component of a comprehensive MRSA colonization surveillance program. It is not intended to diagnose MRSA infection nor to guide or monitor treatment for MRSA infections. Performed at [facility], [place] [address] [place], [place] [address]      Radiology Studies: No results found. CT ABDOMEN PELVIS WO CONTRAST  Final Result    CT Head Wo Contrast  Final Result    DG Chest Port 1 View  Final Result      Scheduled Meds: . chlorhexidine  15 mL Mouth Rinse BID  . Chlorhexidine Gluconate Cloth  6 each Topical Daily  . folic acid  1 mg Oral Daily  . lactulose  20 g Oral BID   . mouth rinse  15 mL Mouth Rinse q12n4p  . multivitamin with minerals  1 tablet Oral Daily  . pneumococcal 23 valent vaccine  0.5 mL Intramuscular Tomorrow-1000  .  prednisoLONE  40 mg Oral Daily  . thiamine  100 mg Oral Daily   Or  . thiamine  100 mg Intravenous Daily   PRN Meds:  Continuous Infusions:   LOS: 6 days  Time spent: Greater than 50% of the 35 minute visit was spent in counseling/coordination of care for the patient as laid out in the A&P.   Dwyane Dee, MD [facility] 10/24/2020, 1:58 PM

## 2020-10-25 LAB — CBC WITH DIFFERENTIAL/PLATELET
Abs Immature Granulocytes: 0.27 10*3/uL — ABNORMAL HIGH (ref 0.00–0.07)
Basophils Absolute: 0 10*3/uL (ref 0.0–0.1)
Basophils Relative: 0 %
Eosinophils Absolute: 0 10*3/uL (ref 0.0–0.5)
Eosinophils Relative: 0 %
HCT: 21.8 % — ABNORMAL LOW (ref 36.0–46.0)
Hemoglobin: 7.9 g/dL — ABNORMAL LOW (ref 12.0–15.0)
Immature Granulocytes: 2 %
Lymphocytes Relative: 7 %
Lymphs Abs: 1.1 10*3/uL (ref 0.7–4.0)
MCH: 40.7 pg — ABNORMAL HIGH (ref 26.0–34.0)
MCHC: 36.2 g/dL — ABNORMAL HIGH (ref 30.0–36.0)
MCV: 112.4 fL — ABNORMAL HIGH (ref 80.0–100.0)
Monocytes Absolute: 1.4 10*3/uL — ABNORMAL HIGH (ref 0.1–1.0)
Monocytes Relative: 10 %
Neutro Abs: 11.9 10*3/uL — ABNORMAL HIGH (ref 1.7–7.7)
Neutrophils Relative %: 81 %
Platelets: 120 10*3/uL — ABNORMAL LOW (ref 150–400)
RBC: 1.94 MIL/uL — ABNORMAL LOW (ref 3.87–5.11)
RDW: 25.2 % — ABNORMAL HIGH (ref 11.5–15.5)
WBC: 14.6 10*3/uL — ABNORMAL HIGH (ref 4.0–10.5)
nRBC: 0.3 % — ABNORMAL HIGH (ref 0.0–0.2)

## 2020-10-25 LAB — COMPREHENSIVE METABOLIC PANEL
ALT: 87 U/L — ABNORMAL HIGH (ref 0–44)
AST: 138 U/L — ABNORMAL HIGH (ref 15–41)
Albumin: 1.9 g/dL — ABNORMAL LOW (ref 3.5–5.0)
Alkaline Phosphatase: 150 U/L — ABNORMAL HIGH (ref 38–126)
Anion gap: 7 (ref 5–15)
BUN: 32 mg/dL — ABNORMAL HIGH (ref 6–20)
CO2: 23 mmol/L (ref 22–32)
Calcium: 8.9 mg/dL (ref 8.9–10.3)
Chloride: 106 mmol/L (ref 98–111)
Creatinine, Ser: 0.74 mg/dL (ref 0.44–1.00)
GFR, Estimated: >60 mL/min (ref >60)
Glucose, Bld: 103 mg/dL — ABNORMAL HIGH (ref 70–99)
Potassium: 3.2 mmol/L — ABNORMAL LOW (ref 3.5–5.1)
Sodium: 136 mmol/L (ref 135–145)
Total Bilirubin: 23.9 mg/dL (ref 0.3–1.2)
Total Protein: 6.3 g/dL — ABNORMAL LOW (ref 6.5–8.1)

## 2020-10-25 LAB — PROTIME-INR
INR: 2.1 — ABNORMAL HIGH (ref 0.8–1.2)
Prothrombin Time: 23.2 seconds — ABNORMAL HIGH (ref 11.4–15.2)

## 2020-10-25 LAB — MAGNESIUM: Magnesium: 1.6 mg/dL — ABNORMAL LOW (ref 1.7–2.4)

## 2020-10-25 MED ORDER — LACTULOSE 10 GM/15ML PO SOLN
10.0000 g | Freq: Two times a day (BID) | ORAL | Status: DC
  Administered 2020-10-25 – 2020-10-27 (×4): 10 g via ORAL
  Filled 2020-10-25 (×4): qty 15

## 2020-10-25 MED ORDER — MAGNESIUM OXIDE -MG SUPPLEMENT 400 (240 MG) MG PO TABS
800.0000 mg | ORAL_TABLET | Freq: Once | ORAL | Status: AC
  Administered 2020-10-25: 800 mg via ORAL
  Filled 2020-10-25: qty 2

## 2020-10-25 MED ORDER — POTASSIUM CHLORIDE CRYS ER 20 MEQ PO TBCR
40.0000 meq | EXTENDED_RELEASE_TABLET | Freq: Once | ORAL | Status: AC
  Administered 2020-10-25: 40 meq via ORAL
  Filled 2020-10-25: qty 2

## 2020-10-25 NOTE — TOC Progression Note (Signed)
Transition of Care [facility]) - Progression Note    Patient Details  Name: Lori Oconnell MRN: [id number] Date of Birth: May 05, 1961  Transition of Care [facility]) CM/SW Contact  Drayden Lukas, Juliann Pulse, RN Phone Number: 10/25/2020, 3:28 PM  Clinical Narrative:   . 1.3 mi [place] [facility] and [place], [place] [address] [phone] Overall rating Above average 2. 1.6 mi [place] [facility] [place], [place] [address] [phone] Overall rating Much below average 3. 2.1 mi [place], [place] [address] [phone] Overall rating Much below average 4. 2.5 mi [facility] at [place], [place] [address] [phone] Overall rating Average 5. 2.8 mi [facility] at the [place], [place] [address] [phone] Overall rating Above average 6. 2.8 mi [place] [address] [place], [place] [address] [phone] Overall rating Average 7. 3 mi [facility] [place], [place] [address] [phone] Overall rating Much above average 8. 3.6 [place] [address] [place], [place] [address] [phone] Overall rating Average 9. 3.6 mi [facility] [address] [place], [place] [address] [phone] Overall rating Below average 10. 3.9 mi [facility] [place], [place] [address] [phone] Overall rating Below average 11. 4.4 mi [facility] at [place], [place] [address] [phone] Overall rating Much above average 12. 4.6 mi [facility] [address] [place], [place] [address] [phone] Overall rating Above average 13. 5.5  mi [facility] [address] [place], [place] [address] [phone] Overall rating Above average 14. 8.2 [facility] [place], [place] [address] [phone] Overall rating Above average 15. 9 mi [facility] [place] [address] [place], [place] [address] [phone] Overall rating Average 16. 9.1 mi [facility] and [place], [place] [address] [phone] Overall rating Below average 17. 9.2 mi [place] [address] [place], [place] [address] [phone] Overall rating Much above average 18. 10.8 mi [place] at [facility] [address] [place], [place] [address] [phone] Overall rating Much above average 19. 12.6 mi [facility] [address] [place], [place] [address] [phone] Overall rating Much below average 20. 12.8 [facility] [place], [place] [address] [phone] Overall rating Much below average 21. 14.2 mi The [place] [address] [place], [place] [address] [phone] Overall rating Much below average 22. 14.4 mi [facility] at [place], [place] [address] [phone] Overall rating Below average 23. 14.8 mi [place] [facility] [place], [place] [address] [phone] Overall rating Average 24. 14.9 mi [place] [address] [place], [place] [address] [phone] Overall rating Much below average 25. 16.5 mi [place] [address] [place], [place] [address] [phone] Overall rating Above average 26. 16.7 mi [facility] [place], [place] [address] [phone] Overall rating Much above average 27. 17.9 mi [place],  [address] [phone] Overall  rating Average 28. 28.7 [facility] [address] [place], [place] [address] [phone] Overall rating Much below average 29. 19.7 mi [place] [address] [place], [place] [address] [phone] Overall rating Much below average 30. 20 mi [address] at the [facility] at [facility], [place] [address] [phone] Overall rating Much above average 31. 21.1 mi [facility] [place], [place] [address] [phone] Overall rating Much below average 32. 21.6 [address] [place], [place] [address] [phone] Overall rating Below average 33. 48.0 [facility] [address] [place], [place] [address] [phone] Overall rating Below average 34. 21.8 [place], [place] [address] [phone] Overall rating Much above average 35. [address] [place], [place] [address] [phone] Overall rating Much above average 36. 22.6 mi [facility] [address] [place], [place] [address] [phone] Overall rating Below average 37. 22.7 mi [facility] [place], [place] [address] [phone] Overall rating Much below average 38. 23.3 mi [facility] [place] [address] [place], [place] [address] [phone] Overall rating Average 39. 23.5 mi [place], [place] [address] [phone] Overall rating Much below average 40. 24.1 mi [place] [address] [place], [place] [address] [phone] Overall rating Below average 41. 24.2 mi [place] [address] [place], [place] [address] [phone] Overall rating Below average 42. 24.4 [facility], [place] [address] [phone] Overall rating Much below average 43. 24.5 mi [facility] [place], [place] [address] [phone] Overall rating Average To    Expected Discharge Plan: Home/Self Care Barriers to Discharge: Continued Medical Work up  Expected Discharge Plan and Services Expected Discharge Plan: Home/Self Care   Discharge Planning Services: CM Consult   Living arrangements for the past 2 months: Single Family Home                                       Social Determinants of Health (SDOH) Interventions    Readmission Risk Interventions No flowsheet data found.

## 2020-10-25 NOTE — Progress Notes (Signed)
Physical Therapy Treatment Patient Details Name: Lori Oconnell MRN: [id number] DOB: 11-21-1960 Today's Date: 10/25/2020    History of Present Illness Lori Oconnell is a 60 yo female with presents with AMS, found on the floor. PMH: HTN, heavy ETOH abuse    PT Comments    Pt tolerates BLE strengthening exercises with cues for motor control and decreased momentum. Pt attempted STS reps for strengthening without UEs and with single UE, but unable, required bil UE to assist in powering up to stand, supv for safety. Pt tolerates longer ambulation bout this session, taking slow, choppy steps with decreased heel-toe pattern and bil hip/knee flexion in swing despite cues, min guard for safety. Pt reports daughter working from home unable to assist pt and pt agreeable to SNF for strengthening to return to baseline prior to return home due to spouse working and unable to assist 24/7. Continue to progress acute PT as able.   Follow Up Recommendations  SNF (vs HHPT pending progress, family availability)     Equipment Recommendations  Rolling walker with 5" wheels;3in1 (PT)    Recommendations for Other Services       Precautions / Restrictions Precautions Precautions: Fall Restrictions Weight Bearing Restrictions: No    Mobility  Bed Mobility  General bed mobility comments: in recliner upon arrival    Transfers Overall transfer level: Needs assistance Equipment used: Rolling walker (2 wheeled) Transfers: Sit to/from Stand Sit to Stand: Supervision  General transfer comment: supv for safety, attempted reps without BUE assisting to power up but unable, good steadiness upon rising  Ambulation/Gait Ambulation/Gait assistance: Min guard Gait Distance (Feet): 180 Feet Assistive device: Rolling walker (2 wheeled) Gait Pattern/deviations: Step-through pattern;Decreased stride length Gait velocity: decreased   General Gait Details: decreased bil heel-toe pattern, decreased bil hip/knee flexion  in swing, slow flat foot steps with RW, cued for increased foot clearance and step length with minimal improvement, no LOB   Stairs             Wheelchair Mobility    Modified Rankin (Stroke Patients Only)       Balance Overall balance assessment: Needs assistance Sitting-balance support: Feet supported Sitting balance-Leahy Scale: Good Sitting balance - Comments: seated EOB   Standing balance support: During functional activity;Bilateral upper extremity supported Standing balance-Leahy Scale: Poor Standing balance comment: reliant on UE support         Cognition Arousal/Alertness: Awake/alert Behavior During Therapy: WFL for tasks assessed/performed Overall Cognitive Status: Within Functional Limits for tasks assessed  General Comments: Pt follows commands appropriately, no family present, appears reserved with ambulation but denies complaints      Exercises General Exercises - Lower Extremity Long Arc Quad: Seated;AROM;Strengthening;Both;15 reps Hip Flexion/Marching: Seated;AROM;Strengthening;Both;15 reps Heel Raises: Standing;AROM;Strengthening;Both;10 reps Mini-Sqauts: Standing;5 reps Other Exercises Other Exercises: STS, 10 reps from recliner, attempted without UEs assisting but unable    General Comments        Pertinent Vitals/Pain Pain Assessment: No/denies pain    Home Living                      Prior Function            PT Goals (current goals can now be found in the care plan section) Acute Rehab PT Goals Patient Stated Goal: get back to baseline, unsure if SNF vs HHPT is better- want to discuss with other family PT Goal Formulation: With patient/family Time For Goal Achievement: 11/04/20 Potential to Achieve Goals: Good Progress towards PT  goals: Progressing toward goals    Frequency    Min 3X/week      PT Plan Current plan remains appropriate    Co-evaluation              AM-PAC PT "6 Clicks" Mobility    Outcome Measure  Help needed turning from your back to your side while in a flat bed without using bedrails?: A Little Help needed moving from lying on your back to sitting on the side of a flat bed without using bedrails?: A Little Help needed moving to and from a bed to a chair (including a wheelchair)?: A Little Help needed standing up from a chair using your arms (e.g., wheelchair or bedside chair)?: A Little Help needed to walk in hospital room?: A Little Help needed climbing 3-5 steps with a railing? : A Little 6 Click Score: 18    End of Session Equipment Utilized During Treatment: Gait belt Activity Tolerance: Patient tolerated treatment well Patient left: in chair;with call bell/phone within reach;with chair alarm set;with nursing/sitter in room Nurse Communication: Mobility status PT Visit Diagnosis: Unsteadiness on feet (R26.81);Other abnormalities of gait and mobility (R26.89);Difficulty in walking, not elsewhere classified (R26.2)     Time: 0912-0930 PT Time Calculation (min) (ACUTE ONLY): 18 min  Charges:  $Therapeutic Exercise: 8-22 mins                      Talbot Grumbling PT, DPT 10/25/20, 12:36 PM

## 2020-10-25 NOTE — Progress Notes (Signed)
Progress Note    Lori Oconnell   VOH:[id number]  DOB: 09/06/1960  DOA: 10/18/2020     7  PCP: [name], MD  CC: AMS  Hospital Course: Lori Oconnell a 60 y.o.femalewith medical history significant forheavy alcohol abuse, HTN, HLD who presentsdue to family concern for altered mental status. Patient alert and oriented only to self and place on admission.  Patient was reported to have been drinking more heavily prior to admission after recent stressors at home. She was on a drinking binge and averaging approximately 1 pint of liquor per day.  She was found on the floor by granddaughter prior to being brought to the hospital.  Interval History:  No events overnight.  Mentation is slow but she is still alert and oriented.  Does have some trouble focusing at times.  She keeps asking when she is able to be discharged.  Again discussed with her that liver function needs to be further monitored some more.  ROS: Constitutional: negative for chills and fevers, Respiratory: negative for cough, Cardiovascular: negative for chest pain and Gastrointestinal: negative for abdominal pain  Assessment & Plan: Acute hepatic encephalopathy - Ammonia level of 160 on 10/18/20; repeated on 10/23/20, 144 - continue lactulose -Mentation improved, but still some trouble focusing at times - Excessive bowel movements.  Decrease lactulose to 10 g twice daily (changed on 10/25/2020) - Recheck ammonia level on 6/94/8546  Alcoholic cirrhosis Alcoholic hepatitis - small ascites seen on CT A/P on 10/18/20 - at risk for progressive liver failure; patient heavily counseled for alcohol cessation - no s/s withdrawal; d/c CIWA MELD-Na score: 27 at 10/25/2020  4:44 AM MELD score: 27 at 10/25/2020  4:44 AM Calculated from: Serum Creatinine: 0.74 mg/dL (Using min of 1 mg/dL) at 10/25/2020  4:44 AM Serum Sodium: 136 mmol/L at 10/25/2020  4:44 AM Total Bilirubin: 23.9 mg/dL at 10/25/2020  4:44 AM INR(ratio): 2.1 at  10/25/2020  4:44 AM Age: 22 years - Discriminant function 96 points.  Given minimal improvement, will start on prednisolone for at least 1 week.  She will need repeat CMP, PT/INR to decide if warrants continuing steroids for up to 28 days. - discussed with GI, also in agreement with steroids; no indication for liver transplant eval at this time; will need close outpatient follow up after discharge for ongoing management - LFTs still slowly uptrending; this might occur for several days; for now, ideal goal is to see LFTs peak and start to downtrend prior to discharge - given ongoing anemia and LFT elevation, may need further screening for varices and/or more recs for lingering LFT elevation; GI consult placed  Macrocytic anemia - MCV 115 - folate low 4.3, B12 is 2703 -Continue folic acid and multivitamin - also likely chronic component 2/2 etoh abuse   Hypokalemia - Repleted, follow repeat labs  Hyponatremia - improving  -Na of 129 from dehydration and alcohol use - s/p repletion  AKI - resolved  - Likely pre-renal - resolved with IVF  Old records reviewed in assessment of this patient  Antimicrobials:   DVT prophylaxis: SCDs Start: 10/18/20 2307   Code Status:   Code Status: Full Code Family Communication: husband  Disposition Plan: Status is: Inpatient  Remains inpatient appropriate because:Inpatient level of care appropriate due to severity of illness   Dispo: The patient is from: Home              Anticipated d/c is to: SNF  Patient currently is not medically stable to d/c.   Difficult to place patient No  Risk of unplanned readmission score: Unplanned Admission- Pilot do not use: 11.9   Objective: Blood pressure 119/66, pulse 91, temperature 98.1 F (36.7 C), temperature source Oral, resp. rate 18, height 5\' 6"  (1.676 m), weight 77 kg, last menstrual period 03/19/2011, SpO2 99 %.  Examination: General appearance: awake, alert, cooperative; slowed  mentation but AOx3 Head: Normocephalic, without obvious abnormality, atraumatic Eyes: scleral icterus appreciated Lungs: clear to auscultation bilaterally Heart: regular rate and rhythm and S1, S2 normal Abdomen: soft, NT, ND, BS present Extremities: 1+ B/L LE edema Skin: mobility and turgor normal Neurologic: Grossly normal.  No asterixis or tremor appreciated  Consultants:   GI  Procedures:     Data Reviewed: I have personally reviewed following labs and imaging studies Results for orders placed or performed during the hospital encounter of 10/18/20 (from the past 24 hour(s))  Protime-INR     Status: Abnormal   Collection Time: 10/25/20  4:44 AM  Result Value Ref Range   Prothrombin Time 23.2 (H) 11.4 - 15.2 seconds   INR 2.1 (H) 0.8 - 1.2  CBC with Differential/Platelet     Status: Abnormal   Collection Time: 10/25/20  4:44 AM  Result Value Ref Range   WBC 14.6 (H) 4.0 - 10.5 K/uL   RBC 1.94 (L) 3.87 - 5.11 MIL/uL   Hemoglobin 7.9 (L) 12.0 - 15.0 g/dL   HCT 21.8 (L) 36.0 - 46.0 %   MCV 112.4 (H) 80.0 - 100.0 fL   MCH 40.7 (H) 26.0 - 34.0 pg   MCHC 36.2 (H) 30.0 - 36.0 g/dL   RDW 25.2 (H) 11.5 - 15.5 %   Platelets 120 (L) 150 - 400 K/uL   nRBC 0.3 (H) 0.0 - 0.2 %   Neutrophils Relative % 81 %   Neutro Abs 11.9 (H) 1.7 - 7.7 K/uL   Lymphocytes Relative 7 %   Lymphs Abs 1.1 0.7 - 4.0 K/uL   Monocytes Relative 10 %   Monocytes Absolute 1.4 (H) 0.1 - 1.0 K/uL   Eosinophils Relative 0 %   Eosinophils Absolute 0.0 0.0 - 0.5 K/uL   Basophils Relative 0 %   Basophils Absolute 0.0 0.0 - 0.1 K/uL   Immature Granulocytes 2 %   Abs Immature Granulocytes 0.27 (H) 0.00 - 0.07 K/uL   Target Cells PRESENT   Magnesium     Status: Abnormal   Collection Time: 10/25/20  4:44 AM  Result Value Ref Range   Magnesium 1.6 (L) 1.7 - 2.4 mg/dL  Comprehensive metabolic panel     Status: Abnormal   Collection Time: 10/25/20  4:44 AM  Result Value Ref Range   Sodium 136 135 - 145 mmol/L    Potassium 3.2 (L) 3.5 - 5.1 mmol/L   Chloride 106 98 - 111 mmol/L   CO2 23 22 - 32 mmol/L   Glucose, Bld 103 (H) 70 - 99 mg/dL   BUN 32 (H) 6 - 20 mg/dL   Creatinine, Ser 0.74 0.44 - 1.00 mg/dL   Calcium 8.9 8.9 - 10.3 mg/dL   Total Protein 6.3 (L) 6.5 - 8.1 g/dL   Albumin 1.9 (L) 3.5 - 5.0 g/dL   AST 138 (H) 15 - 41 U/L   ALT 87 (H) 0 - 44 U/L   Alkaline Phosphatase 150 (H) 38 - 126 U/L   Total Bilirubin 23.9 (HH) 0.3 - 1.2 mg/dL   GFR, Estimated >60 >  60 mL/min   Anion gap 7 5 - 15    Recent Results (from the past 240 hour(s))  Resp Panel by RT-PCR (Flu A&B, Covid) Nasopharyngeal Swab     Status: None   Collection Time: 10/18/20 10:04 PM   Specimen: Nasopharyngeal Swab; Nasopharyngeal(NP) swabs in vial transport medium  Result Value Ref Range Status   SARS Coronavirus 2 by RT PCR NEGATIVE NEGATIVE Final    Comment: (NOTE) SARS-CoV-2 target nucleic acids are NOT DETECTED.  The SARS-CoV-2 RNA is generally detectable in upper respiratory specimens during the acute phase of infection. The lowest concentration of SARS-CoV-2 viral copies this assay can detect is 138 copies/mL. A negative result does not preclude SARS-Cov-2 infection and should not be used as the sole basis for treatment or other patient management decisions. A negative result may occur with  improper specimen collection/handling, submission of specimen other than nasopharyngeal swab, presence of viral mutation(s) within the areas targeted by this assay, and inadequate number of viral copies(<138 copies/mL). A negative result must be combined with clinical observations, patient history, and epidemiological information. The expected result is Negative.  Fact Sheet for Patients:  EntrepreneurPulse.com.au  Fact Sheet for Healthcare Providers:  IncredibleEmployment.be  This test is no t yet approved or cleared by the [place] FDA and  has been authorized for detection  and/or diagnosis of SARS-CoV-2 by FDA under an Emergency Use Authorization (EUA). This EUA will remain  in effect (meaning this test can be used) for the duration of the COVID-19 declaration under Section 564(b)(1) of the Act, 21 U.S.C.section 360bbb-3(b)(1), unless the authorization is terminated  or revoked sooner.       Influenza A by PCR NEGATIVE NEGATIVE Final   Influenza B by PCR NEGATIVE NEGATIVE Final    Comment: (NOTE) The Xpert Xpress SARS-CoV-2/FLU/RSV plus assay is intended as an aid in the diagnosis of influenza from Nasopharyngeal swab specimens and should not be used as a sole basis for treatment. Nasal washings and aspirates are unacceptable for Xpert Xpress SARS-CoV-2/FLU/RSV testing.  Fact Sheet for Patients: EntrepreneurPulse.com.au  Fact Sheet for Healthcare Providers: IncredibleEmployment.be  This test is not yet approved or cleared by the [place] FDA and has been authorized for detection and/or diagnosis of SARS-CoV-2 by FDA under an Emergency Use Authorization (EUA). This EUA will remain in effect (meaning this test can be used) for the duration of the COVID-19 declaration under Section 564(b)(1) of the Act, 21 U.S.C. section 360bbb-3(b)(1), unless the authorization is terminated or revoked.  Performed at [facility], [place] [address], [place], [place] [address]   MRSA PCR Screening     Status: None   Collection Time: 10/19/20  1:26 AM   Specimen: Nasopharyngeal  Result Value Ref Range Status   MRSA by PCR NEGATIVE NEGATIVE Final    Comment:        The GeneXpert MRSA Assay (FDA approved for NASAL specimens only), is one component of a comprehensive MRSA colonization surveillance program. It is not intended to diagnose MRSA infection nor to guide or monitor treatment for MRSA infections. Performed at [facility], [address], [place], [place] [address]       Radiology Studies: No results found. CT ABDOMEN PELVIS WO CONTRAST  Final Result    CT Head Wo Contrast  Final Result    DG Chest Port 1 View  Final Result      Scheduled Meds: . chlorhexidine  15 mL Mouth Rinse BID  . folic  acid  1 mg Oral Daily  . lactulose  20 g Oral BID  . mouth rinse  15 mL Mouth Rinse q12n4p  . multivitamin with minerals  1 tablet Oral Daily  . pneumococcal 23 valent vaccine  0.5 mL Intramuscular Tomorrow-1000  . prednisoLONE  40 mg Oral Daily  . thiamine  100 mg Oral Daily   Or  . thiamine  100 mg Intravenous Daily   PRN Meds:  Continuous Infusions:   LOS: 7 days  Time spent: Greater than 50% of the 35 minute visit was spent in counseling/coordination of care for the patient as laid out in the A&P.   Dwyane Dee, MD [facility] 10/25/2020, 12:49 PM

## 2020-10-25 NOTE — Consult Note (Signed)
Referring Provider: Dr. Dwyane Dee [facility]) Primary Care Physician:  [name], MD Primary Gastroenterologist:  Dr. Therisa Doyne [facility])  Reason for Consultation: Decompensated cirrhosis  HPI: Lori Oconnell is a 60 y.o. female with past medical history of alcohol use presenting for consultation of cirrhosis and alcoholic hepatitis.  Patient presented to the ED don 5/9 due to altered mental status.  Presentation consistent with hepatic encephalopathy.  She was started on lactulose with improvement in mentation.  Patient denies any abdominal pain, nausea, vomiting, changes in stool, melena, or hematochezia.  She is tolerating a diet and states she is ready to go home.  Takes 81 mg ASA daily. Takes naproxen as needed. Denies blood thinner use.  Patient states she drinks 1-2 alcoholic beverages per day, though per chart review, upon arrival to ED, patient's husband reported patient drinks at least a pint of liquid per day.  Negative Cologuard 2019. No prior EGD/colonoscopy.  Past Medical History:  Diagnosis Date  . Anxiety   . [place] DISEASE, CERVICAL 07/13/2009  . Edema 03/22/2011  . HEMATOCHEZIA 11/30/2009  . HYPERLIPIDEMIA 07/18/2009  . HYPERTENSION 07/12/2009  . Irregular menses 03/22/2011  . NECK PAIN 07/12/2009  . OVERACTIVE BLADDER 07/12/2009  . RENAL INSUFFICIENCY 07/12/2009  . SINUSITIS- ACUTE-NOS 07/12/2009  . Sleep apnea    prescribed cpap but has not gotten  . TRANSAMINASES, SERUM, ELEVATED 07/12/2009    Past Surgical History:  Procedure Laterality Date  . MASS EXCISION N/A 08/27/2012   Procedure: EXCISION MASS UPPER BACK;  Surgeon: Joyice Faster. Cornett, MD;  Location: [facility];  Service: General;  Laterality: N/A;  . s/p ganglion cyst Right 1980  . TUBAL LIGATION  1990    Prior to Admission medications   Medication Sig Start Date End Date Taking? Authorizing Provider  amLODipine (NORVASC) 5 MG tablet Take 1 tablet (5 mg total) by mouth daily. Must keep scheduled appt for future  refills 09/03/20  Yes [name], MD  aspirin 81 MG tablet Take 81 mg by mouth daily.   Yes [provider]  escitalopram (LEXAPRO) 10 MG tablet Take 1 tablet (10 mg total) by mouth daily. Must keep scheduled appt for future refills 09/03/20  Yes [name], MD  azelastine (OPTIVAR) 0.05 % ophthalmic solution Place 1 drop into both eyes 2 (two) times daily. Patient not taking: No sig reported 06/12/19   [name], MD  naproxen (NAPROSYN) 500 MG tablet Take 1 tablet (500 mg total) by mouth 2 (two) times daily with a meal. Patient not taking: No sig reported 05/09/17   [name], MD  triamcinolone (NASACORT) 55 MCG/ACT AERO nasal inhaler Place 2 sprays into the nose daily. Patient not taking: No sig reported 06/12/19   [name], MD  Vitamin D, Ergocalciferol, (DRISDOL) 1.25 MG (50000 UT) CAPS capsule Take 1 capsule (50,000 Units total) by mouth every 7 (seven) days. 06/15/19   [name], MD    Scheduled Meds: . chlorhexidine  15 mL Mouth Rinse BID  . folic acid  1 mg Oral Daily  . lactulose  10 g Oral BID  . mouth rinse  15 mL Mouth Rinse q12n4p  . multivitamin with minerals  1 tablet Oral Daily  . pneumococcal 23 valent vaccine  0.5 mL Intramuscular Tomorrow-1000  . prednisoLONE  40 mg Oral Daily  . thiamine  100 mg Oral Daily   Or  . thiamine  100 mg Intravenous Daily   Continuous Infusions: PRN Meds:.  Allergies as of 10/18/2020  . (No Known Allergies)    Family History  Problem Relation Age of Onset  . Hypertension Mother   . Diabetes Maternal Aunt   . Colon cancer Neg Hx     Social History   Socioeconomic History  . Marital status: Married    Spouse name: Not on file  . Number of children: 2  . Years of education: Not on file  . Highest education level: Not on file  Occupational History  . Occupation: CNA  Tobacco Use  . Smoking status: Current Some Day Smoker    Packs/day: 0.25    Years: 5.00    Pack years: 1.25    Types: Cigarettes   . Smokeless tobacco: Former Systems developer    Types: Chew  . Tobacco comment: smokes 2 to 3 cigarettes a day  Vaping Use  . Vaping Use: Never used  Substance and Sexual Activity  . Alcohol use: Yes    Alcohol/week: 3.0 standard drinks    Types: 3 Standard drinks or equivalent per week    Comment: wine, beer weekly   . Drug use: No  . Sexual activity: Never    Birth control/protection: None  Other Topics Concern  . Not on file  Social History Narrative  . Not on file   Social Determinants of Health   Financial Resource Strain: Not on file  Food Insecurity: Not on file  Transportation Needs: Not on file  Physical Activity: Not on file  Stress: Not on file  Social Connections: Not on file  Intimate Partner Violence: Not on file    Review of Systems: Review of Systems  Constitutional: Positive for malaise/fatigue. Negative for weight loss.  HENT: Negative for hearing loss and tinnitus.   Eyes: Negative for pain and redness.  Respiratory: Negative for cough and shortness of breath.   Cardiovascular: Negative for chest pain and palpitations.  Gastrointestinal: Negative for abdominal pain, blood in stool, constipation, diarrhea, heartburn, melena, nausea and vomiting.  Genitourinary: Negative for flank pain and hematuria.  Musculoskeletal: Negative for back pain and joint pain.  Skin: Negative for rash.  Neurological: Negative for dizziness and seizures.  Endo/Heme/Allergies: Negative for polydipsia. Does not bruise/bleed easily.  Psychiatric/Behavioral: Positive for substance abuse (ETOH). The patient is not nervous/anxious.     Physical Exam: Vital signs: Vitals:   10/25/20 0443 10/25/20 1316  BP: 119/66 111/74  Pulse: 91 67  Resp: 18 18  Temp: 98.1 F (36.7 C) 97.7 F (36.5 C)  SpO2: 99% 100%   Last BM Date: 10/23/20  Physical Exam Vitals reviewed.  Constitutional:      General: She is not in acute distress. HENT:     Head: Normocephalic and atraumatic.     Nose:  Nose normal. No congestion.     Mouth/Throat:     Mouth: Mucous membranes are moist.     Pharynx: Oropharynx is clear.  Eyes:     General: Scleral icterus present.     Extraocular Movements: Extraocular movements intact.  Cardiovascular:     Rate and Rhythm: Normal rate and regular rhythm.     Heart sounds: Normal heart sounds.  Pulmonary:     Effort: Pulmonary effort is normal. No respiratory distress.  Abdominal:     General: Bowel sounds are normal. There is no distension.     Palpations: Abdomen is soft. There is no mass.     Tenderness: There is no abdominal tenderness. There is no guarding or rebound.  Hernia: No hernia is present.  Musculoskeletal:        General: No swelling or tenderness.     Cervical back: Normal range of motion and neck supple.  Skin:    General: Skin is warm and dry.     Coloration: Skin is jaundiced.  Neurological:     General: No focal deficit present.     Mental Status: She is alert and oriented to person, place, and time.  Psychiatric:        Mood and Affect: Mood normal.        Behavior: Behavior normal. Behavior is cooperative.    GI:  Lab Results: Recent Labs    10/23/20 0456 10/24/20 0424 10/25/20 0444  WBC 13.0* 12.3* 14.6*  HGB 7.5* 7.4* 7.9*  HCT 20.8* 20.2* 21.8*  PLT 120* 120* 120*   BMET Recent Labs    10/23/20 0456 10/24/20 0424 10/25/20 0444  NA 134* 137 136  K 3.2* 3.2* 3.2*  CL 104 107 106  CO2 22 22 23   GLUCOSE 127* 132* 103*  BUN 24* 29* 32*  CREATININE 0.93 0.85 0.74  CALCIUM 8.1* 8.5* 8.9   LFT Recent Labs    10/25/20 0444  PROT 6.3*  ALBUMIN 1.9*  AST 138*  ALT 87*  ALKPHOS 150*  BILITOT 23.9*   PT/INR Recent Labs    10/24/20 0424 10/25/20 0444  LABPROT 24.7* 23.2*  INR 2.2* 2.1*     Studies/Results: No results found.  Impression: Decompensated cirrhosis and alcoholic hepatitis with encephalopathy: MELD score of 28 as of 10/25/20 (27-32% 90-day mortality); hepatic discriminant  function remains >32 as of 10/25/20 -T. Bili 23.9/ AST 138/ ALT 87/ ALP 150 -Thrombocytopenia: platelets 120 K/uL -Coagulopathy: INR 2.1 -CT abdomen/pelvis 10/18/20: Severe fatty liver with findings of cirrhosis, small ascites, cholelithiasis.  Plan: Continue prednisolone 40 mg daily for 28 days. After 28 days, if improving, taper for 16 sday (decrease the dose by 10 mg per day every four days until a dose of 10 mg per day is reached, at which point, decrease it by 5 mg per day every three days).  Continue lactulose titrated for 2-3 soft BMs/day.  Continue to trend LFTs.  If stable, OK to discharge tomorrow with repeat LFTs in 1-2 weeks.  Recommend outpatient FU with Dr. Therisa Doyne within 4-6 weeks.  Importance of complete alcohol cessation discussed with the patient in detail.   LOS: 7 days   Salley Slaughter  PA-C 10/25/2020, 1:45 PM  Contact #  [phone]

## 2020-10-25 NOTE — Plan of Care (Signed)
  Problem: Education: Goal: Knowledge of General Education information will improve Description: Including pain rating scale, medication(s)/side effects and non-pharmacologic comfort measures Outcome: Progressing   Problem: Clinical Measurements: Goal: Diagnostic test results will improve Outcome: Progressing   Problem: Activity: Goal: Risk for activity intolerance will decrease Outcome: Progressing   Problem: Elimination: Goal: Will not experience complications related to bowel motility Outcome: Progressing   Problem: Safety: Goal: Ability to remain free from injury will improve Outcome: Progressing

## 2020-10-25 NOTE — Care Management Important Message (Signed)
Important Message  Patient Details IM Letter given to the Patient. Name: Lori Oconnell MRN: [id number] Date of Birth: [birth year]   Medicare Important Message Given:  Yes     Kerin Salen 10/25/2020, 11:03 AM

## 2020-10-25 NOTE — TOC Progression Note (Signed)
Transition of Care [facility]) - Progression Note    Patient Details  Name: Lori Oconnell MRN: [id number] Date of Birth: [birth year]  [facility] CM/SW Contact  Latifa Noble, Juliann Pulse, RN Phone Number: 10/25/2020, 3:28 PM  Clinical Narrative:   Patient agree to SNF-provided w/SNF bed offers-await choice.Will need covid, & insurance auth.     Barriers to Discharge: Continued Medical Work up  Expected Discharge Plan and Services Expected Discharge Plan: Home/Self Care   Discharge Planning Services: CM Consult   Living arrangements for the past 2 months: Single Family Home                                       Social Determinants of Health (SDOH) Interventions    Readmission Risk Interventions No flowsheet data found.

## 2020-10-26 ENCOUNTER — Encounter: Payer: Self-pay | Admitting: Internal Medicine

## 2020-10-26 LAB — CBC WITH DIFFERENTIAL/PLATELET
Abs Immature Granulocytes: 0.23 10*3/uL — ABNORMAL HIGH (ref 0.00–0.07)
Basophils Absolute: 0 10*3/uL (ref 0.0–0.1)
Basophils Relative: 0 %
Eosinophils Absolute: 0 10*3/uL (ref 0.0–0.5)
Eosinophils Relative: 0 %
HCT: 20.3 % — ABNORMAL LOW (ref 36.0–46.0)
Hemoglobin: 7.4 g/dL — ABNORMAL LOW (ref 12.0–15.0)
Immature Granulocytes: 2 %
Lymphocytes Relative: 5 %
Lymphs Abs: 0.7 10*3/uL (ref 0.7–4.0)
MCH: 41.1 pg — ABNORMAL HIGH (ref 26.0–34.0)
MCHC: 36.5 g/dL — ABNORMAL HIGH (ref 30.0–36.0)
MCV: 112.8 fL — ABNORMAL HIGH (ref 80.0–100.0)
Monocytes Absolute: 1.2 10*3/uL — ABNORMAL HIGH (ref 0.1–1.0)
Monocytes Relative: 8 %
Neutro Abs: 11.9 10*3/uL — ABNORMAL HIGH (ref 1.7–7.7)
Neutrophils Relative %: 85 %
Platelets: 114 10*3/uL — ABNORMAL LOW (ref 150–400)
RBC: 1.8 MIL/uL — ABNORMAL LOW (ref 3.87–5.11)
WBC: 14 10*3/uL — ABNORMAL HIGH (ref 4.0–10.5)
nRBC: 0.3 % — ABNORMAL HIGH (ref 0.0–0.2)

## 2020-10-26 LAB — COMPREHENSIVE METABOLIC PANEL
ALT: 94 U/L — ABNORMAL HIGH (ref 0–44)
AST: 135 U/L — ABNORMAL HIGH (ref 15–41)
Albumin: 1.9 g/dL — ABNORMAL LOW (ref 3.5–5.0)
Alkaline Phosphatase: 135 U/L — ABNORMAL HIGH (ref 38–126)
Anion gap: 7 (ref 5–15)
BUN: 34 mg/dL — ABNORMAL HIGH (ref 6–20)
CO2: 22 mmol/L (ref 22–32)
Calcium: 8.9 mg/dL (ref 8.9–10.3)
Chloride: 106 mmol/L (ref 98–111)
Creatinine, Ser: 0.8 mg/dL (ref 0.44–1.00)
GFR, Estimated: >60 mL/min (ref >60)
Glucose, Bld: 122 mg/dL — ABNORMAL HIGH (ref 70–99)
Potassium: 3.4 mmol/L — ABNORMAL LOW (ref 3.5–5.1)
Sodium: 135 mmol/L (ref 135–145)
Total Bilirubin: 22.9 mg/dL (ref 0.3–1.2)
Total Protein: 6 g/dL — ABNORMAL LOW (ref 6.5–8.1)

## 2020-10-26 LAB — AMMONIA: Ammonia: 71 umol/L — ABNORMAL HIGH (ref 9–35)

## 2020-10-26 LAB — PROTIME-INR
INR: 1.9 — ABNORMAL HIGH (ref 0.8–1.2)
Prothrombin Time: 22.2 seconds — ABNORMAL HIGH (ref 11.4–15.2)

## 2020-10-26 LAB — MAGNESIUM: Magnesium: 1.5 mg/dL — ABNORMAL LOW (ref 1.7–2.4)

## 2020-10-26 LAB — SARS CORONAVIRUS 2 (TAT 6-24 HRS): SARS Coronavirus 2: NEGATIVE

## 2020-10-26 MED ORDER — POTASSIUM CHLORIDE CRYS ER 20 MEQ PO TBCR
40.0000 meq | EXTENDED_RELEASE_TABLET | Freq: Once | ORAL | Status: AC
  Administered 2020-10-26: 40 meq via ORAL
  Filled 2020-10-26: qty 2

## 2020-10-26 MED ORDER — MAGNESIUM SULFATE 4 GM/100ML IV SOLN
4.0000 g | Freq: Once | INTRAVENOUS | Status: AC
  Administered 2020-10-26: 4 g via INTRAVENOUS
  Filled 2020-10-26: qty 100

## 2020-10-26 NOTE — Plan of Care (Signed)
  Problem: Education: Goal: Knowledge of General Education information will improve Description: Including pain rating scale, medication(s)/side effects and non-pharmacologic comfort measures Outcome: Progressing   Problem: Clinical Measurements: Goal: Diagnostic test results will improve Outcome: Progressing   Problem: Activity: Goal: Risk for activity intolerance will decrease Outcome: Progressing   Problem: Elimination: Goal: Will not experience complications related to bowel motility Outcome: Progressing   

## 2020-10-26 NOTE — Progress Notes (Signed)
[facility] Gastroenterology Progress Note  Lori Oconnell 60 y.o. 11/19/1960  CC:  Decompensated cirrhosis, alcoholic hepatitis   Subjective: Patient reports feeling well. She denies abdominal pain, nausea, or vomiting.  Has been having loose stools on lactulose.  ROS : Review of Systems  Cardiovascular: Negative for chest pain and palpitations.  Gastrointestinal: Positive for diarrhea. Negative for abdominal pain, blood in stool, constipation, heartburn, melena, nausea and vomiting.   Objective: Vital signs in last 24 hours: Vitals:   10/25/20 2036 10/26/20 0520  BP: 123/67 110/61  Pulse: 76 93  Resp: 18   Temp: (!) 97.2 F (36.2 C) 98.4 F (36.9 C)  SpO2: 100% 99%    Physical Exam:  General:  Alert, oriented, cooperative, no distress  Head:  Normocephalic, without obvious abnormality, atraumatic  Eyes:  Deep icterus, EOMs intact  Lungs:   Breathing comfortably on room air, respirations unlabored  Abdomen:   Soft, non-tender, non-distended  Extremities: Extremities normal, atraumatic, no  edema    Lab Results: Recent Labs    10/25/20 0444 10/26/20 0436  NA 136 135  K 3.2* 3.4*  CL 106 106  CO2 23 22  GLUCOSE 103* 122*  BUN 32* 34*  CREATININE 0.74 0.80  CALCIUM 8.9 8.9  MG 1.6* 1.5*   Recent Labs    10/25/20 0444 10/26/20 0436  AST 138* 135*  ALT 87* 94*  ALKPHOS 150* 135*  BILITOT 23.9* 22.9*  PROT 6.3* 6.0*  ALBUMIN 1.9* 1.9*   Recent Labs    10/25/20 0444 10/26/20 0436  WBC 14.6* 14.0*  NEUTROABS 11.9* 11.9*  HGB 7.9* 7.4*  HCT 21.8* 20.3*  MCV 112.4* 112.8*  PLT 120* 114*   Recent Labs    10/25/20 0444 10/26/20 0436  LABPROT 23.2* 22.2*  INR 2.1* 1.9*      Assessment: Decompensated cirrhosis and alcoholic hepatitis with encephalopathy: MELD score of 28 as of 10/25/20 (27-32% 90-day mortality); hepatic discriminant function remains >32 as of 10/25/20 -Presumably alcohol-related, acute hepatitis panel this admission was negative;  additional work-up in 2020 was negative with the exception of ASMA mildly elevated (23); AMA, ceruloplasmin, alpha-1 anti-trypsin, iron panel normal -T. Bili 22.9/ AST 135/ ALT 94/ ALP 135 as compared to yesterday T. Bili 23.9/ AST 138/ ALT 87/ ALP 150 -Thrombocytopenia: platelets 114 K/uL -Coagulopathy: INR 1.9 -CT abdomen/pelvis 10/18/20: Severe fatty liver with findings of cirrhosis, small ascites, cholelithiasis.  Plan: Continue prednisolone 40 mg daily for 28 days. After 28 days, if improving, taper for 16 sday (decrease the dose by 10 mg per day every four days until a dose of 10 mg per day is reached, at which point, decrease it by 5 mg per day every three days).  Continue lactulose titrated for 2-3 soft BMs/day.  OK to discharge from a GI standpoint.  Repeat LFTs in 1-2 weeks.  Repeat ASMA/AMA, given prior mildly elevated ASMA.  Recommend outpatient FU with Dr. Therisa Doyne within 4-6 weeks.  Importance of complete alcohol cessation discussed with the patient in detail.  [facility] will sign off. Please contact us if we can be of any further assistance during this hospital stay.  Salley Slaughter PA-C 10/26/2020, 10:10 AM  Contact #  [phone]

## 2020-10-26 NOTE — Progress Notes (Signed)
Progress Note    Marisol Giambra   IOE:[id number]  DOB: 1961/03/11  DOA: 10/18/2020     8  PCP: [name], MD  CC: AMS  Hospital Course: Westlyn Glaza a 60 y.o.femalewith medical history significant forheavy alcohol abuse, HTN, HLD who presentsdue to family concern for altered mental status. Patient alert and oriented only to self and place on admission.  Patient was reported to have been drinking more heavily prior to admission after recent stressors at home. She was on a drinking binge and averaging approximately 1 pint of liquor per day.  She was found on the floor by granddaughter prior to being brought to the hospital.  Interval History:  No events overnight.  Calm and pleasant this morning.  Still anxious for discharging from the hospital; discussed again that we are waiting for insurance authorization prior to her being able to leave.  If received, hopeful for discharge tomorrow.  ROS: Constitutional: negative for chills and fevers, Respiratory: negative for cough, Cardiovascular: negative for chest pain and Gastrointestinal: negative for abdominal pain  Assessment & Plan: Acute hepatic encephalopathy - Ammonia level of 160 on 10/18/20; repeated on 10/23/20, 144 - continue lactulose -Mentation improved, but still some trouble focusing at times - Excessive bowel movements.  Decrease lactulose to 10 g twice daily (changed on 10/25/2020) - Recheck ammonia level on 1/82/9937: 71  Alcoholic cirrhosis Alcoholic hepatitis - small ascites seen on CT A/P on 10/18/20 - at risk for progressive liver failure; patient heavily counseled for alcohol cessation - no s/s withdrawal; d/c CIWA MELD-Na score: 26 at 10/26/2020  4:36 AM MELD score: 25 at 10/26/2020  4:36 AM Calculated from: Serum Creatinine: 0.80 mg/dL (Using min of 1 mg/dL) at 10/26/2020  4:36 AM Serum Sodium: 135 mmol/L at 10/26/2020  4:36 AM Total Bilirubin: 22.9 mg/dL at 10/26/2020  4:36 AM INR(ratio): 1.9 at 10/26/2020  4:36  AM Age: 71 years - Discriminant function 96 points.  Given minimal improvement, will start on prednisolone - discussed with GI, also in agreement with steroids; no indication for liver transplant eval at this time; will need close outpatient follow up after discharge for ongoing management - per GI, will need 8 week steroid course: "prednisolone 40 mg daily for 28 days. After 28 days, if improving, taper for 16 sday (decrease the dose by 10 mg per day every four days until a dose of 10 mg per day is reached, at which point, decrease it by 5 mg per day every three days)." - should be stable/ready for d/c on 5/18; LFTs seemed to finally have improved some on 5/17 finally  Macrocytic anemia - MCV 115 - folate low 4.3, B12 is 1696 -Continue folic acid and multivitamin - also likely chronic component 2/2 etoh abuse   Hypokalemia - Repleted, follow repeat labs  Hyponatremia - improving  -Na of 129 from dehydration and alcohol use - s/p repletion  AKI - resolved  - Likely pre-renal - resolved with IVF  Old records reviewed in assessment of this patient  Antimicrobials:   DVT prophylaxis: SCDs Start: 10/18/20 2307   Code Status:   Code Status: Full Code Family Communication: husband  Disposition Plan: Status is: Inpatient  Remains inpatient appropriate because:Inpatient level of care appropriate due to severity of illness   Dispo: The patient is from: Home              Anticipated d/c is to: SNF; anticipated on 5/18  Patient currently is not medically stable to d/c.   Difficult to place patient No  Risk of unplanned readmission score: Unplanned Admission- Pilot do not use: 12.08   Objective: Blood pressure 111/65, pulse 76, temperature 97.8 F (36.6 C), temperature source Oral, resp. rate 18, height 5\' 6"  (1.676 m), weight 77 kg, last menstrual period 03/19/2011, SpO2 100 %.  Examination: General appearance: awake, alert, cooperative; slowed mentation but  AOx3 Head: Normocephalic, without obvious abnormality, atraumatic Eyes: scleral icterus appreciated Lungs: clear to auscultation bilaterally Heart: regular rate and rhythm and S1, S2 normal Abdomen: soft, NT, ND, BS present Extremities: 1+ B/L LE edema Skin: mobility and turgor normal Neurologic: Grossly normal.  No asterixis or tremor appreciated  Consultants:   GI  Procedures:     Data Reviewed: I have personally reviewed following labs and imaging studies Results for orders placed or performed during the hospital encounter of 10/18/20 (from the past 24 hour(s))  Protime-INR     Status: Abnormal   Collection Time: 10/26/20  4:36 AM  Result Value Ref Range   Prothrombin Time 22.2 (H) 11.4 - 15.2 seconds   INR 1.9 (H) 0.8 - 1.2  Comprehensive metabolic panel     Status: Abnormal   Collection Time: 10/26/20  4:36 AM  Result Value Ref Range   Sodium 135 135 - 145 mmol/L   Potassium 3.4 (L) 3.5 - 5.1 mmol/L   Chloride 106 98 - 111 mmol/L   CO2 22 22 - 32 mmol/L   Glucose, Bld 122 (H) 70 - 99 mg/dL   BUN 34 (H) 6 - 20 mg/dL   Creatinine, Ser 0.80 0.44 - 1.00 mg/dL   Calcium 8.9 8.9 - 10.3 mg/dL   Total Protein 6.0 (L) 6.5 - 8.1 g/dL   Albumin 1.9 (L) 3.5 - 5.0 g/dL   AST 135 (H) 15 - 41 U/L   ALT 94 (H) 0 - 44 U/L   Alkaline Phosphatase 135 (H) 38 - 126 U/L   Total Bilirubin 22.9 (HH) 0.3 - 1.2 mg/dL   GFR, Estimated >60 >60 mL/min   Anion gap 7 5 - 15  Ammonia     Status: Abnormal   Collection Time: 10/26/20  4:36 AM  Result Value Ref Range   Ammonia 71 (H) 9 - 35 umol/L  CBC with Differential/Platelet     Status: Abnormal   Collection Time: 10/26/20  4:36 AM  Result Value Ref Range   WBC 14.0 (H) 4.0 - 10.5 K/uL   RBC 1.80 (L) 3.87 - 5.11 MIL/uL   Hemoglobin 7.4 (L) 12.0 - 15.0 g/dL   HCT 20.3 (L) 36.0 - 46.0 %   MCV 112.8 (H) 80.0 - 100.0 fL   MCH 41.1 (H) 26.0 - 34.0 pg   MCHC 36.5 (H) 30.0 - 36.0 g/dL   RDW Not Measured 11.5 - 15.5 %   Platelets 114 (L) 150  - 400 K/uL   nRBC 0.3 (H) 0.0 - 0.2 %   Neutrophils Relative % 85 %   Neutro Abs 11.9 (H) 1.7 - 7.7 K/uL   Lymphocytes Relative 5 %   Lymphs Abs 0.7 0.7 - 4.0 K/uL   Monocytes Relative 8 %   Monocytes Absolute 1.2 (H) 0.1 - 1.0 K/uL   Eosinophils Relative 0 %   Eosinophils Absolute 0.0 0.0 - 0.5 K/uL   Basophils Relative 0 %   Basophils Absolute 0.0 0.0 - 0.1 K/uL   Immature Granulocytes 2 %   Abs Immature Granulocytes 0.23 (H)  0.00 - 0.07 K/uL   Reactive, Benign Lymphocytes PRESENT    Target Cells PRESENT   Magnesium     Status: Abnormal   Collection Time: 10/26/20  4:36 AM  Result Value Ref Range   Magnesium 1.5 (L) 1.7 - 2.4 mg/dL    Recent Results (from the past 240 hour(s))  Resp Panel by RT-PCR (Flu A&B, Covid) Nasopharyngeal Swab     Status: None   Collection Time: 10/18/20 10:04 PM   Specimen: Nasopharyngeal Swab; Nasopharyngeal(NP) swabs in vial transport medium  Result Value Ref Range Status   SARS Coronavirus 2 by RT PCR NEGATIVE NEGATIVE Final    Comment: (NOTE) SARS-CoV-2 target nucleic acids are NOT DETECTED.  The SARS-CoV-2 RNA is generally detectable in upper respiratory specimens during the acute phase of infection. The lowest concentration of SARS-CoV-2 viral copies this assay can detect is 138 copies/mL. A negative result does not preclude SARS-Cov-2 infection and should not be used as the sole basis for treatment or other patient management decisions. A negative result may occur with  improper specimen collection/handling, submission of specimen other than nasopharyngeal swab, presence of viral mutation(s) within the areas targeted by this assay, and inadequate number of viral copies(<138 copies/mL). A negative result must be combined with clinical observations, patient history, and epidemiological information. The expected result is Negative.  Fact Sheet for Patients:  BloggerCourse.com  Fact Sheet for Healthcare Providers:   SeriousBroker.it  This test is no t yet approved or cleared by the [place] FDA and  has been authorized for detection and/or diagnosis of SARS-CoV-2 by FDA under an Emergency Use Authorization (EUA). This EUA will remain  in effect (meaning this test can be used) for the duration of the COVID-19 declaration under Section 564(b)(1) of the Act, 21 U.S.C.section 360bbb-3(b)(1), unless the authorization is terminated  or revoked sooner.       Influenza A by PCR NEGATIVE NEGATIVE Final   Influenza B by PCR NEGATIVE NEGATIVE Final    Comment: (NOTE) The Xpert Xpress SARS-CoV-2/FLU/RSV plus assay is intended as an aid in the diagnosis of influenza from Nasopharyngeal swab specimens and should not be used as a sole basis for treatment. Nasal washings and aspirates are unacceptable for Xpert Xpress SARS-CoV-2/FLU/RSV testing.  Fact Sheet for Patients: BloggerCourse.com  Fact Sheet for Healthcare Providers: SeriousBroker.it  This test is not yet approved or cleared by the [place] FDA and has been authorized for detection and/or diagnosis of SARS-CoV-2 by FDA under an Emergency Use Authorization (EUA). This EUA will remain in effect (meaning this test can be used) for the duration of the COVID-19 declaration under Section 564(b)(1) of the Act, 21 U.S.C. section 360bbb-3(b)(1), unless the authorization is terminated or revoked.  Performed at [facility], [address], [place], [place] [address]   MRSA PCR Screening     Status: None   Collection Time: 10/19/20  1:26 AM   Specimen: Nasopharyngeal  Result Value Ref Range Status   MRSA by PCR NEGATIVE NEGATIVE Final    Comment:        The GeneXpert MRSA Assay (FDA approved for NASAL specimens only), is one component of a comprehensive MRSA colonization surveillance program. It is not intended to diagnose MRSA infection nor  to guide or monitor treatment for MRSA infections. Performed at [facility], [address], [place], [place] [address]      Radiology Studies: No results found. CT ABDOMEN PELVIS WO CONTRAST  Final Result  CT Head Wo Contrast  Final Result    DG Chest Port 1 View  Final Result      Scheduled Meds: . chlorhexidine  15 mL Mouth Rinse BID  . folic acid  1 mg Oral Daily  . lactulose  10 g Oral BID  . mouth rinse  15 mL Mouth Rinse q12n4p  . multivitamin with minerals  1 tablet Oral Daily  . pneumococcal 23 valent vaccine  0.5 mL Intramuscular Tomorrow-1000  . prednisoLONE  40 mg Oral Daily  . thiamine  100 mg Oral Daily   Or  . thiamine  100 mg Intravenous Daily   PRN Meds:  Continuous Infusions:   LOS: 8 days  Time spent: Greater than 50% of the 35 minute visit was spent in counseling/coordination of care for the patient as laid out in the A&P.   Dwyane Dee, MD [facility] 10/26/2020, 2:12 PM

## 2020-10-26 NOTE — TOC Progression Note (Addendum)
Transition of Care [facility]) - Progression Note    Patient Details  Name: Katiana Ruland MRN: [id number] Date of Birth: 01/29/1961  [facility] CM/SW Contact  Ross Ludwig, [place] Phone Number: 10/26/2020, 2:59 PM  Clinical Narrative:    CSW spoke to patient, and asked her decision for SNF.  Patient chose [facility], CSW started insurance authorization,  reference number [id number].  CSW to continue to follow patient's progress throughout discharge planning.  SNF can accept patient tomorrow if insurance approves.   Expected Discharge Plan: Home/Self Care Barriers to Discharge: Continued Medical Work up  Expected Discharge Plan and Services Expected Discharge Plan: Home/Self Care   Discharge Planning Services: CM Consult   Living arrangements for the past 2 months: Single Family Home                                       Social Determinants of Health (SDOH) Interventions    Readmission Risk Interventions No flowsheet data found.

## 2020-10-27 LAB — COMPREHENSIVE METABOLIC PANEL
ALT: 99 U/L — ABNORMAL HIGH (ref 0–44)
AST: 137 U/L — ABNORMAL HIGH (ref 15–41)
Albumin: 1.9 g/dL — ABNORMAL LOW (ref 3.5–5.0)
Alkaline Phosphatase: 142 U/L — ABNORMAL HIGH (ref 38–126)
Anion gap: 6 (ref 5–15)
BUN: 34 mg/dL — ABNORMAL HIGH (ref 6–20)
CO2: 23 mmol/L (ref 22–32)
Calcium: 8.9 mg/dL (ref 8.9–10.3)
Chloride: 109 mmol/L (ref 98–111)
Creatinine, Ser: 0.77 mg/dL (ref 0.44–1.00)
GFR, Estimated: >60 mL/min (ref >60)
Glucose, Bld: 117 mg/dL — ABNORMAL HIGH (ref 70–99)
Potassium: 3.7 mmol/L (ref 3.5–5.1)
Sodium: 138 mmol/L (ref 135–145)
Total Bilirubin: 20.9 mg/dL (ref 0.3–1.2)
Total Protein: 6 g/dL — ABNORMAL LOW (ref 6.5–8.1)

## 2020-10-27 LAB — CBC WITH DIFFERENTIAL/PLATELET
Abs Immature Granulocytes: 0.19 10*3/uL — ABNORMAL HIGH (ref 0.00–0.07)
Basophils Absolute: 0 10*3/uL (ref 0.0–0.1)
Basophils Relative: 0 %
Eosinophils Absolute: 0 10*3/uL (ref 0.0–0.5)
Eosinophils Relative: 0 %
HCT: 20.6 % — ABNORMAL LOW (ref 36.0–46.0)
Hemoglobin: 7.3 g/dL — ABNORMAL LOW (ref 12.0–15.0)
Immature Granulocytes: 1 %
Lymphocytes Relative: 5 %
Lymphs Abs: 0.6 10*3/uL — ABNORMAL LOW (ref 0.7–4.0)
MCH: 41 pg — ABNORMAL HIGH (ref 26.0–34.0)
MCHC: 35.4 g/dL (ref 30.0–36.0)
MCV: 115.7 fL — ABNORMAL HIGH (ref 80.0–100.0)
Monocytes Absolute: 1.4 10*3/uL — ABNORMAL HIGH (ref 0.1–1.0)
Monocytes Relative: 10 %
Neutro Abs: 12.1 10*3/uL — ABNORMAL HIGH (ref 1.7–7.7)
Neutrophils Relative %: 84 %
Platelets: 105 10*3/uL — ABNORMAL LOW (ref 150–400)
RBC: 1.78 MIL/uL — ABNORMAL LOW (ref 3.87–5.11)
RDW: 25.2 % — ABNORMAL HIGH (ref 11.5–15.5)
WBC: 14.4 10*3/uL — ABNORMAL HIGH (ref 4.0–10.5)
nRBC: 0.2 % (ref 0.0–0.2)

## 2020-10-27 LAB — PROTIME-INR
INR: 1.9 — ABNORMAL HIGH (ref 0.8–1.2)
Prothrombin Time: 21.7 seconds — ABNORMAL HIGH (ref 11.4–15.2)

## 2020-10-27 LAB — ANTI-SMOOTH MUSCLE ANTIBODY, IGG: F-Actin IgG: 14 Units (ref 0–19)

## 2020-10-27 LAB — MITOCHONDRIAL ANTIBODIES: Mitochondrial M2 Ab, IgG: <20 Units (ref 0.0–20.0)

## 2020-10-27 LAB — MAGNESIUM: Magnesium: 2 mg/dL (ref 1.7–2.4)

## 2020-10-27 MED ORDER — FOLIC ACID 1 MG PO TABS: 1.0000 mg | ORAL_TABLET | Freq: Every day | ORAL | 0 refills | Status: AC

## 2020-10-27 MED ORDER — LACTULOSE 10 GM/15ML PO SOLN: 10.0000 g | Freq: Two times a day (BID) | ORAL | 0 refills | Status: DC

## 2020-10-27 MED ORDER — THIAMINE HCL 100 MG PO TABS: 100.0000 mg | ORAL_TABLET | Freq: Every day | ORAL | 0 refills | Status: AC

## 2020-10-27 MED ORDER — ADULT MULTIVITAMIN W/MINERALS CH: 1.0000 | ORAL_TABLET | Freq: Every day | ORAL | 0 refills | Status: AC

## 2020-10-27 MED ORDER — PREDNISOLONE 5 MG PO TABS: 40.0000 mg | ORAL_TABLET | Freq: Every day | ORAL | 0 refills | Status: AC

## 2020-10-27 NOTE — TOC Transition Note (Signed)
Transition of Care [facility]) - CM/SW Discharge Note   Patient Details  Name: Lori Oconnell MRN: [id number] Date of Birth: 02-May-1961  [facility]) CM/SW Contact:  Dessa Phi, RN Phone Number: 10/27/2020, 3:21 PM   Clinical Narrative:  D/c home w/[facility]HC[facility] HHPT/OT/aide/CSW-rep Ramond Marrow aware. Patient agrees to d/c plan since denied for SNF.Patient will transport home by w/c transport-[place] safe ride-Nsg to manage. Patient has own key to get into house. No further CM needs.     Final next level of care: [place] Barriers to Discharge: No Barriers Identified   Patient Goals and CMS Choice Patient states their goals for this hospitalization and ongoing recovery are:: to go home CMS Medicare.gov Compare Post Acute Care list provided to:: Patient    Discharge Placement                       Discharge Plan and Services   Discharge Planning Services: CM Consult                      HH Arranged: PT,OT,Nurse's Aide,Social Work [place]: [place] ([place]) Date [place]: 10/27/20 Time [place]: 2836 Representative spoke with at [place]: [place] Determinants of Health ([place]) Interventions     Readmission Risk Interventions No flowsheet data found.

## 2020-10-27 NOTE — Progress Notes (Signed)
PROGRESS NOTE  Lori Oconnell DGL:[id number] DOB: 06-16-1960 DOA: 10/18/2020 PCP: [name], MD  HPI/Recap of past 24 hours: Lori Oconnell a 60 y.o.femalewith medical history significant forheavy alcohol abuse, HTN, HLD who presentsdue to family concern for altered mental status. Patient alert and oriented only to self and place on admission.  Patient was reported to have been drinking more heavily prior to admission after recent stressors at home. She was on a drinking binge and averaging approximately 1 pint of liquor per day.  She was found on the floor by granddaughter prior to being brought to the hospital.  10/27/20: Patient has been medically cleared for discharge.  Awaiting disposition.  Peer-to-peer completed, denied.  She does not qualify for daily 1 hour therapy x 5 days a week.  Patient was able to ambulate 130 feet, able to get on walker by herself with supervision. Per Dr. [name], would qualify for intermittent therapy 3 times per week either at home or outpatient.  Per Dr. [name], [facility] or long term care are other options.     Assessment/Plan: Principal Problem:   Hepatic encephalopathy (HCC) Active Problems:   Macrocytic anemia   Alcoholic cirrhosis of liver without ascites (HCC)   Supratherapeutic INR   Hyperbilirubinemia   Hyponatremia   AKI (acute kidney injury) ([place])   Alcoholic hepatitis without ascites  Acute hepatic encephalopathy - Ammonia level of 160 on 10/18/20; repeated on 10/23/20, 144 - continue lactulose -Mentation improved, but still some trouble focusing at times - Excessive bowel movements.  Decrease lactulose to 10 g twice daily (changed on 10/25/2020) - Recheck ammonia level on 10/28/8414: 71  Alcoholic cirrhosis Alcoholic hepatitis - small ascites seen on CT A/P on 10/18/20 - at risk for progressive liver failure; patient heavily counseled for alcohol cessation - no s/s withdrawal; d/c CIWA MELD-Na score: 26 at 10/26/2020  4:36 AM MELD score: 25  at 10/26/2020  4:36 AM Calculated from: Serum Creatinine: 0.80 mg/dL (Using min of 1 mg/dL) at 10/26/2020  4:36 AM Serum Sodium: 135 mmol/L at 10/26/2020  4:36 AM Total Bilirubin: 22.9 mg/dL at 10/26/2020  4:36 AM INR(ratio): 1.9 at 10/26/2020  4:36 AM Age: 58 years - Discriminant function 96 points.  Given minimal improvement, will start on prednisolone - discussed with GI, also in agreement with steroids; no indication for liver transplant eval at this time; will need close outpatient follow up after discharge for ongoing management - per GI, will need 8 week steroid course: "prednisolone 40 mg daily for 28 days. After 28 days, if improving, taper for16sday(decrease the dose by 10 mg per day every four days until a dose of 10 mg per day is reached, at which point,decrease it by 5 mg per day every three days)." - should be stable/ready for d/c on 5/18; LFTs seemed to finally have improved some on 5/17 finally  Macrocytic anemia - MCV 115 - folate low 4.3, B12 is 6063 -Continue folic acid and multivitamin - also likely chronic component 2/2 etoh abuse   Hypokalemia - Repleted, follow repeat labs  Hyponatremia - improving  -Na of 129 from dehydration and alcohol use - s/p repletion  AKI - resolved  -Likely pre-renal -resolved with IVF  Old records reviewed in assessment of this patient  Antimicrobials:   DVT prophylaxis: SCDs Start: 10/18/20 2307   Code Status:   Code Status: Full Code Family Communication: husband  Disposition Plan: Status is: Inpatient  Remains inpatient appropriate because:Inpatient level of care appropriate due to severity of  illness   Dispo: The patient is from: Home  Anticipated d/c is to: SNF; anticipated on 5/18  Patient currently is not medically stable to d/c.              Difficult to place patient No   Consultants:   GI     Objective: Vitals:   10/26/20 1155 10/27/20 0058 10/27/20 0525  10/27/20 1300  BP: 111/65 119/75 (!) 154/82 116/73  Pulse: 76 77 87 76  Resp: 18 20 20 18   Temp: 97.8 F (36.6 C) 98 F (36.7 C) 98.4 F (36.9 C) 97.9 F (36.6 C)  TempSrc: Oral Oral Oral Oral  SpO2: 100% 100% 100% 100%  Weight:      Height:        Intake/Output Summary (Last 24 hours) at 10/27/2020 1402 Last data filed at 10/27/2020 1000 Gross per 24 hour  Intake 360 ml  Output --  Net 360 ml   Filed Weights   10/18/20 2035 10/19/20 0445  Weight: 80 kg 77 kg    Exam:  . General: 60 y.o. year-old female well developed well nourished in no acute distress.  Jaundice.  Icteric sclera. . Cardiovascular: Regular rate and rhythm with no rubs or gallops.  No thyromegaly or JVD noted.   Marland Kitchen Respiratory: Clear to auscultation with no wheezes or rales. Good inspiratory effort. . Abdomen: Soft nontender nondistended with normal bowel sounds x4 quadrants. . Musculoskeletal: No lower extremity edema.  Marland Kitchen Psychiatry: Mood is appropriate for condition and setting   Data Reviewed: CBC: Recent Labs  Lab 10/23/20 0456 10/24/20 0424 10/25/20 0444 10/26/20 0436 10/27/20 0425  WBC 13.0* 12.3* 14.6* 14.0* 14.4*  NEUTROABS 10.6* 10.0* 11.9* 11.9* 12.1*  HGB 7.5* 7.4* 7.9* 7.4* 7.3*  HCT 20.8* 20.2* 21.8* 20.3* 20.6*  MCV 112.4* 111.6* 112.4* 112.8* 115.7*  PLT 120* 120* 120* 114* 622*   Basic Metabolic Panel: Recent Labs  Lab 10/23/20 0456 10/24/20 0424 10/25/20 0444 10/26/20 0436 10/27/20 0425  NA 134* 137 136 135 138  K 3.2* 3.2* 3.2* 3.4* 3.7  CL 104 107 106 106 109  CO2 22 22 23 22 23   GLUCOSE 127* 132* 103* 122* 117*  BUN 24* 29* 32* 34* 34*  CREATININE 0.93 0.85 0.74 0.80 0.77  CALCIUM 8.1* 8.5* 8.9 8.9 8.9  MG 1.6* 1.7 1.6* 1.5* 2.0   GFR: Estimated Creatinine Clearance: 80.3 mL/min (by C-G formula based on SCr of 0.77 mg/dL). Liver Function Tests: Recent Labs  Lab 10/23/20 0456 10/24/20 0424 10/25/20 0444 10/26/20 0436 10/27/20 0425  AST 109* 118* 138*  135* 137*  ALT 62* 71* 87* 94* 99*  ALKPHOS 132* 136* 150* 135* 142*  BILITOT 24.3* 23.9* 23.9* 22.9* 20.9*  PROT 5.9* 5.9* 6.3* 6.0* 6.0*  ALBUMIN 1.8* 1.8* 1.9* 1.9* 1.9*   No results for input(s): LIPASE, AMYLASE in the last 168 hours. Recent Labs  Lab 10/23/20 0456 10/26/20 0436  AMMONIA 144* 71*   Coagulation Profile: Recent Labs  Lab 10/23/20 0456 10/24/20 0424 10/25/20 0444 10/26/20 0436 10/27/20 0425  INR 2.4* 2.2* 2.1* 1.9* 1.9*   Cardiac Enzymes: No results for input(s): CKTOTAL, CKMB, CKMBINDEX, TROPONINI in the last 168 hours. BNP (last 3 results) No results for input(s): PROBNP in the last 8760 hours. HbA1C: No results for input(s): HGBA1C in the last 72 hours. CBG: No results for input(s): GLUCAP in the last 168 hours. Lipid Profile: No results for input(s): CHOL, HDL, LDLCALC, TRIG, CHOLHDL, LDLDIRECT in the last 72 hours.  Thyroid Function Tests: No results for input(s): TSH, T4TOTAL, FREET4, T3FREE, THYROIDAB in the last 72 hours. Anemia Panel: No results for input(s): VITAMINB12, FOLATE, FERRITIN, TIBC, IRON, RETICCTPCT in the last 72 hours. Urine analysis:    Component Value Date/Time   COLORURINE YELLOW 10/18/2020 2150   APPEARANCEUR CLOUDY (A) 10/18/2020 2150   LABSPEC 1.011 10/18/2020 2150   PHURINE 5.0 10/18/2020 2150   GLUCOSEU 50 (A) 10/18/2020 2150   GLUCOSEU NEGATIVE 06/12/2019 1232   HGBUR NEGATIVE 10/18/2020 2150   BILIRUBINUR MODERATE (A) 10/18/2020 2150   BILIRUBINUR neg 11/26/2014 1359   KETONESUR NEGATIVE 10/18/2020 2150   PROTEINUR 30 (A) 10/18/2020 2150   UROBILINOGEN 1.0 06/12/2019 1232   NITRITE NEGATIVE 10/18/2020 2150   LEUKOCYTESUR NEGATIVE 10/18/2020 2150   Sepsis Labs: @LABRCNTIP (procalcitonin:4,lacticidven:4)  ) Recent Results (from the past 240 hour(s))  Resp Panel by RT-PCR (Flu A&B, Covid) Nasopharyngeal Swab     Status: None   Collection Time: 10/18/20 10:04 PM   Specimen: Nasopharyngeal Swab;  Nasopharyngeal(NP) swabs in vial transport medium  Result Value Ref Range Status   SARS Coronavirus 2 by RT PCR NEGATIVE NEGATIVE Final    Comment: (NOTE) SARS-CoV-2 target nucleic acids are NOT DETECTED.  The SARS-CoV-2 RNA is generally detectable in upper respiratory specimens during the acute phase of infection. The lowest concentration of SARS-CoV-2 viral copies this assay can detect is 138 copies/mL. A negative result does not preclude SARS-Cov-2 infection and should not be used as the sole basis for treatment or other patient management decisions. A negative result may occur with  improper specimen collection/handling, submission of specimen other than nasopharyngeal swab, presence of viral mutation(s) within the areas targeted by this assay, and inadequate number of viral copies(<138 copies/mL). A negative result must be combined with clinical observations, patient history, and epidemiological information. The expected result is Negative.  Fact Sheet for Patients:  EntrepreneurPulse.com.au  Fact Sheet for Healthcare Providers:  IncredibleEmployment.be  This test is no t yet approved or cleared by the [place] FDA and  has been authorized for detection and/or diagnosis of SARS-CoV-2 by FDA under an Emergency Use Authorization (EUA). This EUA will remain  in effect (meaning this test can be used) for the duration of the COVID-19 declaration under Section 564(b)(1) of the Act, 21 U.S.C.section 360bbb-3(b)(1), unless the authorization is terminated  or revoked sooner.       Influenza A by PCR NEGATIVE NEGATIVE Final   Influenza B by PCR NEGATIVE NEGATIVE Final    Comment: (NOTE) The Xpert Xpress SARS-CoV-2/FLU/RSV plus assay is intended as an aid in the diagnosis of influenza from Nasopharyngeal swab specimens and should not be used as a sole basis for treatment. Nasal washings and aspirates are unacceptable for Xpert Xpress  SARS-CoV-2/FLU/RSV testing.  Fact Sheet for Patients: EntrepreneurPulse.com.au  Fact Sheet for Healthcare Providers: IncredibleEmployment.be  This test is not yet approved or cleared by the [place] FDA and has been authorized for detection and/or diagnosis of SARS-CoV-2 by FDA under an Emergency Use Authorization (EUA). This EUA will remain in effect (meaning this test can be used) for the duration of the COVID-19 declaration under Section 564(b)(1) of the Act, 21 U.S.C. section 360bbb-3(b)(1), unless the authorization is terminated or revoked.  Performed at [facility], [place] [address] [place], [place] [address]   MRSA PCR Screening     Status: None   Collection Time: 10/19/20  1:26 AM   Specimen: Nasopharyngeal  Result Value Ref Range Status  MRSA by PCR NEGATIVE NEGATIVE Final    Comment:        The GeneXpert MRSA Assay (FDA approved for NASAL specimens only), is one component of a comprehensive MRSA colonization surveillance program. It is not intended to diagnose MRSA infection nor to guide or monitor treatment for MRSA infections. Performed at [facility], [place] [address] [place], [place] [address]   SARS CORONAVIRUS 2 (TAT 6-24 HRS) Nasopharyngeal Nasopharyngeal Swab     Status: None   Collection Time: 10/26/20  9:32 AM   Specimen: Nasopharyngeal Swab  Result Value Ref Range Status   SARS Coronavirus 2 NEGATIVE NEGATIVE Final    Comment: (NOTE) SARS-CoV-2 target nucleic acids are NOT DETECTED.  The SARS-CoV-2 RNA is generally detectable in upper and lower respiratory specimens during the acute phase of infection. Negative results do not preclude SARS-CoV-2 infection, do not rule out co-infections with other pathogens, and should not be used as the sole basis for treatment or other patient management decisions. Negative results must be combined with clinical observations, patient  history, and epidemiological information. The expected result is Negative.  Fact Sheet for Patients: SugarRoll.be  Fact Sheet for Healthcare Providers: https://www.woods-mathews.com/  This test is not yet approved or cleared by the [place] FDA and  has been authorized for detection and/or diagnosis of SARS-CoV-2 by FDA under an Emergency Use Authorization (EUA). This EUA will remain  in effect (meaning this test can be used) for the duration of the COVID-19 declaration under Se ction 564(b)(1) of the Act, 21 U.S.C. section 360bbb-3(b)(1), unless the authorization is terminated or revoked sooner.  Performed at [facility], [place] [address], [place], [place] [address]       Studies: No results found.  Scheduled Meds: . chlorhexidine  15 mL Mouth Rinse BID  . folic acid  1 mg Oral Daily  . lactulose  10 g Oral BID  . mouth rinse  15 mL Mouth Rinse q12n4p  . multivitamin with minerals  1 tablet Oral Daily  . pneumococcal 23 valent vaccine  0.5 mL Intramuscular Tomorrow-1000  . prednisoLONE  40 mg Oral Daily  . thiamine  100 mg Oral Daily   Or  . thiamine  100 mg Intravenous Daily    Continuous Infusions:   LOS: 9 days     Kayleen Memos, MD [facility] Pager [phone]  If 7PM-7AM, please contact night-coverage [email or web address] Password [facility] 10/27/2020, 2:02 PM

## 2020-10-27 NOTE — Discharge Summary (Signed)
Discharge Summary  Lori Oconnell GYI:[id number] DOB: [birth year]  PCP: [name], MD  Admit date: 10/18/2020 Discharge date: 10/27/2020  Time spent: 35 minutes.  Recommendations for Outpatient Follow-up:  1. Follow-up with GI Dr. Therisa Doyne within 2 weeks.  You will need a refill from GI office for Prednisoloe and Lactulose. 2. Follow-up with your primary care provider in 1 to 2 weeks. 3. Completely abstain from alcohol use. 4. Take your medications as prescribed. 5. Continue lactulose titrated for 2-3 soft bowel movements per day. 6. Continue PT OT with assistance and fall precautions.  Recommendations per GI: Continue prednisolone 40 mg daily for 28 days. After 28 days, if improving, taper for16sday(decrease the dose by 10 mg per day every four days until a dose of 10 mg per day is reached, at which point,decrease it by 5 mg per day every three days). Continue lactulose titrated for 2-3 soft BMs/day. Repeat LFTs in 1-2 weeks.   Discharge Diagnoses:  Active Hospital Problems   Diagnosis Date Noted  . Hepatic encephalopathy ([place]) 10/18/2020  . Alcoholic hepatitis without ascites 10/20/2020  . Alcoholic cirrhosis of liver without ascites ([place]) 10/19/2020  . Supratherapeutic INR 10/19/2020  . Hyperbilirubinemia 10/19/2020  . Hyponatremia 10/19/2020  . AKI (acute kidney injury) ([place]) 10/19/2020  . Macrocytic anemia 12/10/2012    Resolved Hospital Problems  No resolved problems to display.    Discharge Condition: Stable.  Diet recommendation: Low-salt diet.  Vitals:   10/27/20 0525 10/27/20 1300  BP: (!) 154/82 116/73  Pulse: 87 76  Resp: 20 18  Temp: 98.4 F (36.9 C) 97.9 F (36.6 C)  SpO2: 100% 100%    History of present illness:  Jareli Highland a 60 y.o.femalewith medical history significant forheavy alcohol abuse, HTN, HLD who presentsdue to family concern for altered mental status. Patient alert and oriented only to self and place on admission.  Patient was  reported to have been drinking more heavily prior to admission after recent stressors at home. She was on a drinking binge and averaging approximately 1 pint of liquor per day. She was found on the floor by granddaughter prior to being brought to the hospital.  10/27/20: Patient has been medically cleared for discharge.  Awaiting disposition.  Peer-to-peer completed, denied.  She does not qualify for daily 1 hour therapy x 5 days a week.  Patient was able to ambulate 130 feet, able to get on walker by herself with supervision. Per Dr. [name], would qualify for intermittent therapy 3 times per week either at home or outpatient.  Per Dr. [name], [facility] or long term care are other options.   Hospital Course:  Principal Problem:   Hepatic encephalopathy (HCC) Active Problems:   Macrocytic anemia   Alcoholic cirrhosis of liver without ascites (H[facility])   Supratherapeutic INR   Hyperbilirubinemia   Hyponatremia   AKI (acute kidney injury) ([place])   Alcoholic hepatitis without ascites  Acute hepatic encephalopathy - Ammonia level of 160 on 10/18/20; repeated on 10/23/20, 144>> repeated on 10/26/20, 71. - Continue lactulose and titrate dose to have bowel movement 2-3 soft stools per day. Follow up with GI within 2 weeks  Alcoholic cirrhosis Alcoholic hepatitis - Small ascites seen on CT A/P on 10/18/20 - At risk for progressive liver failure; patient heavily counseled for alcohol cessation MELD-Na score: 26 at 10/26/2020 4:36 AM MELD score: 25 at 10/26/2020 4:36 AM - per GI, will need 8 week steroid course: "prednisolone 40 mg daily for 28 days. After 28 days, if  improving, taper for16sday(decrease the dose by 10 mg per day every four days until a dose of 10 mg per day is reached, at which point,decrease it by 5 mg per day every three days)."  Macrocytic anemia - MCV 115, Hg 7.3 - folate low 4.3, B12 is 4782 -Continue folic acid and multivitamin - also likely chronic component 2/2 etoh abuse    Resolved Hypokalemia - Repleted  Resolved Hyponatremia -Initially Na of 129 from dehydration and alcohol use -Na+ 138 on 10/27/20  Resolved AKI -Likely pre-renal -resolved with IVF     Code Status:Full Code  Discharge Exam: BP 116/73 (BP Location: Right Arm)   Pulse 76   Temp 97.9 F (36.6 C) (Oral)   Resp 18   Ht 5\' 6"  (1.676 m)   Wt 77 kg   LMP 03/19/2011   SpO2 100%   BMI 27.40 kg/m  . General: 60 y.o. year-old female well developed well nourished in no acute distress.  Alert and interactive. . Cardiovascular: Regular rate and rhythm with no rubs or gallops.  No thyromegaly or JVD noted.   Marland Kitchen Respiratory: Clear to auscultation with no wheezes or rales.  . Abdomen: Soft nontender nondistended with normal bowel sounds x4 quadrants. . Musculoskeletal: No lower extremity edema. Marland Kitchen Psychiatry: Mood is appropriate for condition and setting  Discharge Instructions You were cared for by a hospitalist during your hospital stay. If you have any questions about your discharge medications or the care you received while you were in the hospital after you are discharged, you can call the unit and asked to speak with the hospitalist on call if the hospitalist that took care of you is not available. Once you are discharged, your primary care physician will handle any further medical issues. Please note that NO REFILLS for any discharge medications will be authorized once you are discharged, as it is imperative that you return to your primary care physician (or establish a relationship with a primary care physician if you do not have one) for your aftercare needs so that they can reassess your need for medications and monitor your lab values.   Allergies as of 10/27/2020   No Known Allergies     Medication List    STOP taking these medications   amLODipine 5 MG tablet Commonly known as: NORVASC   azelastine 0.05 % ophthalmic solution Commonly known as: OPTIVAR    naproxen 500 MG tablet Commonly known as: Naprosyn   triamcinolone 55 MCG/ACT Aero nasal inhaler Commonly known as: NASACORT     TAKE these medications   aspirin 81 MG tablet Take 81 mg by mouth daily.   escitalopram 10 MG tablet Commonly known as: LEXAPRO Take 1 tablet (10 mg total) by mouth daily. Must keep scheduled appt for future refills   folic acid 1 MG tablet Commonly known as: FOLVITE Take 1 tablet (1 mg total) by mouth daily. Start taking on: Oct 28, 2020   lactulose 10 GM/15ML solution Commonly known as: CHRONULAC Take 15 mLs (10 g total) by mouth 2 (two) times daily.   multivitamin with minerals Tabs tablet Take 1 tablet by mouth daily. Start taking on: Oct 28, 2020   prednisoLONE 5 MG Tabs tablet Take 8 tablets (40 mg total) by mouth daily. Start taking on: Oct 28, 2020   thiamine 100 MG tablet Take 1 tablet (100 mg total) by mouth daily. Start taking on: Oct 28, 2020   Vitamin D (Ergocalciferol) 1.25 MG (50000 UNIT) Caps capsule Commonly known  as: DRISDOL Take 1 capsule (50,000 Units total) by mouth every 7 (seven) days.            Durable Medical Equipment  (From admission, onward)         Start     Ordered   10/22/20 1156  For home use only DME Walker  Once       Question:  Patient needs a walker to treat with the following condition  Answer:  Generalized muscle weakness   10/22/20 1155   10/22/20 1156  For home use only DME Bedside commode  Once       Question:  Patient needs a bedside commode to treat with the following condition  Answer:  Generalized muscle weakness   10/22/20 1155         No Known Allergies  Follow-up Information    [name], MD. Call in 1 day(s).   Specialties: Internal Medicine, Radiology Why: please call for post hospital follow up appointment Contact information: [place] [address] [place], [place] Follow up.   Why: [place] physical  therapy/occupational therapy/aide/social worker Contact information: [address] [place] [address] [phone]        [name], MD. Call in 1 day(s).   Specialty: Gastroenterology Why: Please call to arrange an appointment as soon as possible.  You need to see GI within a week.  Will need refills for Prednisolone and Lactulose from GI. Contact information: [place] [address] [phone]                The results of significant diagnostics from this hospitalization (including imaging, microbiology, ancillary and laboratory) are listed below for reference.    Significant Diagnostic Studies: CT ABDOMEN PELVIS WO CONTRAST  Result Date: 10/18/2020 CLINICAL DATA:  60 year old female with abdominal distension. EXAM: CT ABDOMEN AND PELVIS WITHOUT CONTRAST TECHNIQUE: Multidetector CT imaging of the abdomen and pelvis was performed following the standard protocol without IV contrast. COMPARISON:  Ultrasound dated 06/21/2018. FINDINGS: Evaluation of this exam is limited in the absence of intravenous contrast. Lower chest: The visualized lung bases are clear. No intra-abdominal free air.  Small ascites. Hepatobiliary: Severe fatty liver. There is irregularity of the liver contour suggestive of cirrhosis. Clinical correlation is recommended. Layering stones noted in the gallbladder. Pancreas: The pancreas is atrophic.  No active inflammatory changes. Spleen: Normal in size without focal abnormality. Adrenals/Urinary Tract: The adrenal glands unremarkable. The kidneys, visualized ureters, and urinary bladder appear unremarkable. Stomach/Bowel: There is no bowel obstruction or active inflammation. No CT findings of acute appendicitis. Vascular/Lymphatic: The abdominal aorta and IVC unremarkable. No portal venous gas. There is no adenopathy. Reproductive: The uterus and ovaries are grossly unremarkable. Other: There is diffuse mesenteric edema and small  ascites. Musculoskeletal: Degenerative changes of the spine. No acute osseous pathology. IMPRESSION: 1. Severe fatty liver with findings of cirrhosis. Clinical correlation is recommended. 2. Cholelithiasis. 3. No bowel obstruction. 4. Small ascites. Electronically Signed   By: Anner Crete M.D.   On: 10/18/2020 23:37   CT Head Wo Contrast  Result Date: 10/18/2020 CLINICAL DATA:  Generalized weakness and vomiting. EXAM: CT HEAD WITHOUT CONTRAST TECHNIQUE: Contiguous axial images were obtained from the base of the skull through the vertex without intravenous contrast. COMPARISON:  None. FINDINGS: Brain: No evidence of acute infarction, hemorrhage, hydrocephalus, extra-axial collection or mass lesion/mass effect. Vascular: No hyperdense  vessel or unexpected calcification. Skull: Normal. Negative for fracture or focal lesion. Sinuses/Orbits: No acute finding. Other: None. IMPRESSION: No acute intracranial abnormality. Electronically Signed   By: Virgina Norfolk M.D.   On: 10/18/2020 21:29   DG Chest Port 1 View  Result Date: 10/18/2020 CLINICAL DATA:  Generalized weakness. EXAM: PORTABLE CHEST 1 VIEW COMPARISON:  August 21, 2012 FINDINGS: The heart size and mediastinal contours are within normal limits. Both lungs are clear. The visualized skeletal structures are unremarkable. IMPRESSION: No active disease. Electronically Signed   By: Virgina Norfolk M.D.   On: 10/18/2020 21:11    Microbiology: Recent Results (from the past 240 hour(s))  Resp Panel by RT-PCR (Flu A&B, Covid) Nasopharyngeal Swab     Status: None   Collection Time: 10/18/20 10:04 PM   Specimen: Nasopharyngeal Swab; Nasopharyngeal(NP) swabs in vial transport medium  Result Value Ref Range Status   SARS Coronavirus 2 by RT PCR NEGATIVE NEGATIVE Final    Comment: (NOTE) SARS-CoV-2 target nucleic acids are NOT DETECTED.  The SARS-CoV-2 RNA is generally detectable in upper respiratory specimens during the acute phase of infection.  The lowest concentration of SARS-CoV-2 viral copies this assay can detect is 138 copies/mL. A negative result does not preclude SARS-Cov-2 infection and should not be used as the sole basis for treatment or other patient management decisions. A negative result may occur with  improper specimen collection/handling, submission of specimen other than nasopharyngeal swab, presence of viral mutation(s) within the areas targeted by this assay, and inadequate number of viral copies(<138 copies/mL). A negative result must be combined with clinical observations, patient history, and epidemiological information. The expected result is Negative.  Fact Sheet for Patients:  EntrepreneurPulse.com.au  Fact Sheet for Healthcare Providers:  IncredibleEmployment.be  This test is no t yet approved or cleared by the [place] FDA and  has been authorized for detection and/or diagnosis of SARS-CoV-2 by FDA under an Emergency Use Authorization (EUA). This EUA will remain  in effect (meaning this test can be used) for the duration of the COVID-19 declaration under Section 564(b)(1) of the Act, 21 U.S.C.section 360bbb-3(b)(1), unless the authorization is terminated  or revoked sooner.       Influenza A by PCR NEGATIVE NEGATIVE Final   Influenza B by PCR NEGATIVE NEGATIVE Final    Comment: (NOTE) The Xpert Xpress SARS-CoV-2/FLU/RSV plus assay is intended as an aid in the diagnosis of influenza from Nasopharyngeal swab specimens and should not be used as a sole basis for treatment. Nasal washings and aspirates are unacceptable for Xpert Xpress SARS-CoV-2/FLU/RSV testing.  Fact Sheet for Patients: EntrepreneurPulse.com.au  Fact Sheet for Healthcare Providers: IncredibleEmployment.be  This test is not yet approved or cleared by the [place] FDA and has been authorized for detection and/or diagnosis of SARS-CoV-2 by FDA  under an Emergency Use Authorization (EUA). This EUA will remain in effect (meaning this test can be used) for the duration of the COVID-19 declaration under Section 564(b)(1) of the Act, 21 U.S.C. section 360bbb-3(b)(1), unless the authorization is terminated or revoked.  Performed at [facility], [place] [address] [place], [place] [address]   MRSA PCR Screening     Status: None   Collection Time: 10/19/20  1:26 AM   Specimen: Nasopharyngeal  Result Value Ref Range Status   MRSA by PCR NEGATIVE NEGATIVE Final    Comment:        The GeneXpert MRSA Assay (FDA approved for NASAL specimens only), is one component of  a comprehensive MRSA colonization surveillance program. It is not intended to diagnose MRSA infection nor to guide or monitor treatment for MRSA infections. Performed at [facility], [place] [address], [place], [place] [address]   SARS CORONAVIRUS 2 (TAT 6-24 HRS) Nasopharyngeal Nasopharyngeal Swab     Status: None   Collection Time: 10/26/20  9:32 AM   Specimen: Nasopharyngeal Swab  Result Value Ref Range Status   SARS Coronavirus 2 NEGATIVE NEGATIVE Final    Comment: (NOTE) SARS-CoV-2 target nucleic acids are NOT DETECTED.  The SARS-CoV-2 RNA is generally detectable in upper and lower respiratory specimens during the acute phase of infection. Negative results do not preclude SARS-CoV-2 infection, do not rule out co-infections with other pathogens, and should not be used as the sole basis for treatment or other patient management decisions. Negative results must be combined with clinical observations, patient history, and epidemiological information. The expected result is Negative.  Fact Sheet for Patients: SugarRoll.be  Fact Sheet for Healthcare Providers: https://www.woods-mathews.com/  This test is not yet approved or cleared by the [place] FDA and  has been authorized for  detection and/or diagnosis of SARS-CoV-2 by FDA under an Emergency Use Authorization (EUA). This EUA will remain  in effect (meaning this test can be used) for the duration of the COVID-19 declaration under Se ction 564(b)(1) of the Act, 21 U.S.C. section 360bbb-3(b)(1), unless the authorization is terminated or revoked sooner.  Performed at [facility], [place] [address] [place], [place] [address]      Labs: Basic Metabolic Panel: Recent Labs  Lab 10/23/20 0456 10/24/20 0424 10/25/20 0444 10/26/20 0436 10/27/20 0425  NA 134* 137 136 135 138  K 3.2* 3.2* 3.2* 3.4* 3.7  CL 104 107 106 106 109  CO2 22 22 23 22 23   GLUCOSE 127* 132* 103* 122* 117*  BUN 24* 29* 32* 34* 34*  CREATININE 0.93 0.85 0.74 0.80 0.77  CALCIUM 8.1* 8.5* 8.9 8.9 8.9  MG 1.6* 1.7 1.6* 1.5* 2.0   Liver Function Tests: Recent Labs  Lab 10/23/20 0456 10/24/20 0424 10/25/20 0444 10/26/20 0436 10/27/20 0425  AST 109* 118* 138* 135* 137*  ALT 62* 71* 87* 94* 99*  ALKPHOS 132* 136* 150* 135* 142*  BILITOT 24.3* 23.9* 23.9* 22.9* 20.9*  PROT 5.9* 5.9* 6.3* 6.0* 6.0*  ALBUMIN 1.8* 1.8* 1.9* 1.9* 1.9*   No results for input(s): LIPASE, AMYLASE in the last 168 hours. Recent Labs  Lab 10/23/20 0456 10/26/20 0436  AMMONIA 144* 71*   CBC: Recent Labs  Lab 10/23/20 0456 10/24/20 0424 10/25/20 0444 10/26/20 0436 10/27/20 0425  WBC 13.0* 12.3* 14.6* 14.0* 14.4*  NEUTROABS 10.6* 10.0* 11.9* 11.9* 12.1*  HGB 7.5* 7.4* 7.9* 7.4* 7.3*  HCT 20.8* 20.2* 21.8* 20.3* 20.6*  MCV 112.4* 111.6* 112.4* 112.8* 115.7*  PLT 120* 120* 120* 114* 105*   Cardiac Enzymes: No results for input(s): CKTOTAL, CKMB, CKMBINDEX, TROPONINI in the last 168 hours. BNP: BNP (last 3 results) No results for input(s): BNP in the last 8760 hours.  ProBNP (last 3 results) No results for input(s): PROBNP in the last 8760 hours.  CBG: No results for input(s): GLUCAP in the last 168 hours.     Signed:  Kayleen Memos, MD [facility] 10/27/2020, 3:55 PM

## 2020-10-27 NOTE — TOC Progression Note (Addendum)
Transition of Care [facility]) - Progression Note    Patient Details  Name: Lori Oconnell MRN: [id number] Date of Birth: 1960-11-30  Transition of [facility]) CM/SW Contact  Rajveer Handler, Juliann Pulse, RN Phone Number: 10/27/2020, 1:15 PM  Clinical Narrative:   Dr. Belinda Fisher for P2P by 5p tel#[phone], option 5; [name], [birth year], member LT#[id number]. SNF-[facility]. 2:52p-Outcome of P2P-denied SNF recc ALF/LTC/HHC.      Expected Discharge Plan: Skilled Nursing Facility Barriers to Discharge: Insurance Authorization  Expected Discharge Plan and Services Expected Discharge Plan: [place]   Discharge Planning Services: CM Consult   Living arrangements for the past 2 months: Single Family Home                                       Social Determinants of Health (SDOH) Interventions    Readmission Risk Interventions No flowsheet data found.

## 2020-10-27 NOTE — Progress Notes (Signed)
Patient discharge home via safe transport, discharge instructions given and explained to patient, she verbalized understanding, patient denies any pain/distress, no pressure injury noted.

## 2020-10-27 NOTE — Progress Notes (Signed)
PT Cancellation Note  Patient Details Name: Lori Oconnell MRN: [id number] DOB: Apr 16, 1961   Cancelled Treatment:    Reason Eval/Treat Not Completed: Patient declined, no reason specified. Pt up in chair upon arrival, declines PT treatment today due to discharging to SNF. Attempted to encourage pt with therapy, notified pt that RN doesn't currently have d/c paperwork and again educated pt on therapeutic process, but pt politely declines PT session. Pt reports she gets enough exercise going back/forth to bathroom with nursing. Will check back tomorrow if pt still in hospital.    Talbot Grumbling PT, DPT 10/27/20, 9:28 AM

## 2020-10-27 NOTE — Discharge Instructions (Signed)
Alcoholic Liver Disease  Alcoholic liver disease is liver damage that is caused by drinking a lot of alcohol for a long time. If you have this disease, you must stop drinking alcohol. What are the causes? This condition is caused by long-term (chronic) heavy alcohol use. What increases the risk? This condition is more likely to appear in:  Women.  People who are very overweight.  People who have a family history of this disease.  People who often drink large amounts of alcohol in a short amount of time (binge drink).  People who have another liver disease.  People who do not eat a healthy diet.  People who lack certain nutrients in their diet, such as folate or thiamine. What are the signs or symptoms? Most people do not have symptoms in the early stages of this disease. If early symptoms do appear, they may include:  Loss of appetite.  Feeling like you may vomit, or vomiting. Symptoms of moderate disease include:  Watery poop (diarrhea).  A fever.  Feeling tired (fatigue).  Weight loss.  The skin and the whites of the eyes turning yellow (jaundice).  Pain and swelling in the belly (abdomen). Symptoms of advanced disease include:  Weight loss and muscle loss.  Itchy skin.  Increase of fluid in the belly (ascites).  Swelling of the feet and ankles (edema).  Nosebleeds or bleeding gums.  Trouble concentrating.  Mood changes or confusion. How is this treated? The most important part of treatment is to stop drinking alcohol. You may also:  Take medicine to decrease symptoms caused by stopping alcohol use (withdrawal symptoms).  Enter a treatment program to help you stop drinking.  Join a support group.  Eat a healthy diet. This includes foods that have a lot of zinc or B vitamins in them.  Take vitamins.  Take medicines to decrease liver swelling (inflammation).  Get a donated liver (liver transplant). This is only done in very bad cases. It is  only for those who have stopped drinking and can commit to never drinking again. Follow these instructions at home:  Do not drink alcohol. Follow your treatment plan. Work with your doctor if you need help.  Think about joining an alcohol support group.  Take over-the-counter and prescription medicines only as told by your doctor. This includes vitamins.  Do not use medicines or eat foods that have alcohol in them, unless your doctor says that it is safe.  Follow instructions from your doctor about eating a healthy diet.  Keep all follow-up visits.   Where to find support  Alcoholics Anonymous (AA): [employer] Contact a doctor if:  You get a fever or chills.  Your skin starts to look more yellow, pale, or dark.  You get headaches. Get help right away if:  You vomit blood.  You have bright red blood in your poop (stool).  Your poop looks black or looks like tar.  You have trouble: ? Thinking. ? Walking. ? Balancing. ? Breathing. These symptoms may be an emergency. Get help right away. Call your local emergency services (911 in the [place]  Do not wait to see if the symptoms will go away.  Do not drive yourself to the hospital. Summary  Alcoholic liver disease is liver damage that is caused by drinking a lot of alcohol for a long time.  If you have this disease, you must stop drinking alcohol. Follow your treatment plan, and work with your doctor as needed.  Think about joining an  alcohol support group. This information is not intended to replace advice given to you by your health care provider. Make sure you discuss any questions you have with your health care provider. Document Revised: 03/11/2020 Document Reviewed: 03/11/2020 Elsevier Patient Education  2021 [place]. Hepatic Encephalopathy  Hepatic encephalopathy is a change in brain function that includes changes in the ability to think and to use muscles. This condition happens when a person has advanced  liver disease. When the liver is damaged, harmful substances (toxins) can build up in the body. Some of these toxins, such as ammonia, can harm the brain. The effects of the condition depend on the type of liver damage and how severe it is. In some cases, hepatic encephalopathy can be reversed. What are the causes? Certain things can trigger or worsen liver function, which can result in hepatic encephalopathy. These things include:  Infection.  Constipation.  Taking certain medicines, such as benzodiazepines.  Alcohol use.  Bleeding into the intestinal tract.  Imbalances in minerals (electrolytes) in the body.  Dehydration. Hepatic encephalopathy can sometimes be reversed if these triggers are resolved. What increases the risk? You are at risk of developing this condition if you have advanced liver disease (cirrhosis). Conditions that can cause liver disease include:  Infections in the liver, such as hepatitis C.  Infections in the blood.  Drinking a lot of alcohol over a long period of time.  Taking certain medicines, including tranquilizers, diuretics, antidepressants, sleeping pills, or acetaminophen.  Genetic diseases, such as Wilson's disease. What are the signs or symptoms? Symptoms may develop suddenly or may develop slowly and get worse gradually. Symptoms can range from mild to severe. Mild symptoms include:  Mild confusion.  Shortened attention span.  Personality and mood changes.  Anxiety and agitation.  Drowsiness. Symptoms of worsening or severe hepatic encephalopathy include:  Extreme confusion (disorientation).  Slowed movement.  Slurred speech.  Extreme personality changes.  Abnormal shaking or flapping of the hands (asterixis).  Coma. How is this diagnosed? This condition may be diagnosed based on:  A physical exam.  Your symptoms and medical history.  Blood tests. These may be done to check levels of ammonia in your blood, measure how  long it takes your blood to clot, or check for infection.  Liver function tests. These may be done to check how well your liver is working.  MRI and CT scans. These may be done to check for a brain disorder and to check for problems with your liver.  Electroencephalogram (EEG). This test measures the electrical activity in your brain. How is this treated? The first step in treatment is to identify and treat the cause of your liver damage or triggering illness, if possible. The next step is taking medicine to lower the level of toxins in your body and prevent ammonia from building up. Treatment will depend on how severe your encephalopathy is, and may include:  Medicine to lower your ammonia level (lactulose).  Antibiotic medicine to reduce the amount of ammonia-producing bacteria in your gut.  Close monitoring of your blood pressure, heart rate, breathing, and oxygen levels.  Removal of fluid from your abdomen.  Close monitoring of how you think, feel, and act (mental status).  Changes to your diet.  Liver transplant, in severe cases. Follow these instructions at home: Medicines  Take over-the-counter and prescription medicines only as told by your health care provider.  If you were prescribed an antibiotic medicine, take it as told by your health care  provider. Do not stop using the antibiotic even if you start to feel better.  Do not start taking any new medicines, including over-the-counter medicines, without first checking with your health care provider. Eating and drinking  Work with a dietitian or your health care provider to make sure you are getting the right balance of protein and minerals.  Eat small meals throughout the day with a late-night snack of complex carbohydrates. Do not fast.  Drink enough fluids to keep your urine pale yellow.  Do not drink alcohol.   General instructions  Do not use drugs.  Ask your health care provider if it is safe for you to  drive.  Keep all follow-up visits. This is important. Contact a health care provider if:  You develop new symptoms.  Your symptoms change or get worse.  You have a fever or chills.  You have persistent nausea, vomiting, or diarrhea. Get help right away if:  You become very confused or drowsy.  You vomit blood or material that looks like coffee grounds.  Your stool is bloody, black, or looks like tar. Summary  Hepatic encephalopathy is a change in brain function that includes changes in the ability to think and to use muscles. This condition happens when a person has advanced liver disease.  Certain things can trigger or worsen hepatic encephalopathy. Hepatic encephalopathy can sometimes be reversed if these triggers are resolved.  The first step in treatment is to identify and treat the cause of your liver damage or triggering illness, if possible. The next step is taking medicine to lower the level of toxins in your body and prevent ammonia from building up.  Your treatment will depend on how severe your hepatic encephalopathy is. This information is not intended to replace advice given to you by your health care provider. Make sure you discuss any questions you have with your health care provider. Document Revised: 02/24/2020 Document Reviewed: 02/24/2020 Elsevier Patient Education  2021 [place].   Jaundice, Adult  Jaundice is when the skin, the whites of the eyes, and the lining of the mouth and nose (mucous membranes) turn a yellowish color. It is caused by having too much bilirubin in the blood. Bilirubin is made by the normal breakdown of red blood cells. Having jaundice means that your body's bile system may not be working as it should. The bile system is made up of the liver, the gallbladder, and the bile ducts. They work together to make, store, and move bile. Jaundice may be caused by drinking too much alcohol. It may also be caused by liver disease, infections,  cancers, or some medicines. Your doctor may treat you with medicine, fluids, or surgery. Follow these instructions at home:  Take over-the-counter and prescription medicines only as told by your doctor.  You can use skin lotion to help with itching.  Drink plenty of fluids.  Do not drink alcohol.  Keep all follow-up visits as told by your doctor. This is important.   Contact a doctor if:  You have a fever.  You have swelling or pain in your belly (abdomen). Get help right away if:  Your symptoms get worse all of a sudden.  Your pain gets worse.  You keep throwing up (vomiting).  You throw up blood.  You become weak or confused.  You get a very bad headache.  You have blood in your poop.  You lose too much body fluid (dehydration). Signs that have you lost too much body fluid include: ?  A very dry mouth. ? A fast, weak pulse. ? Fast breathing. ? Blue lips. Summary  Jaundice is when the skin, the whites of the eyes, and the lining of the mouth and nose turn a yellowish color.  Jaundice may be caused by a problem in the liver, the gallbladder, and the bile ducts.  Follow your doctor's instructions for home care. These include drinking plenty of fluid, not drinking alcohol, and taking medicines as told.  Get help right away if your symptoms get worse, you feel weak or confused, you throw up, you have blood in your poop, you have a very bad headache, or you lose too much body fluid. This information is not intended to replace advice given to you by your health care provider. Make sure you discuss any questions you have with your health care provider. Document Revised: 10/20/2019 Document Reviewed: 10/20/2019 Elsevier Patient Education  [place].

## 2020-10-27 NOTE — TOC Progression Note (Addendum)
Transition of Care [facility]) - Progression Note    Patient Details  Name: Lori Oconnell MRN: [id number] Date of Birth: [birth year]  [facility] CM/SW Contact  Ross Ludwig, [place] Phone Number: 10/27/2020, 2:21 PM  Clinical Narrative:     CSW received phone call from insurance company.  They are requesting a peer to peer.  CSW relayed information to physician to complete.  CSW to continue to follow patient's progress throughout discharge planning.  3:00pm  CSW informed that patient wad denied SNF placement peer to peer completed.  Patient does not want to appeal decision and wants to go home with home health through [facility].  HH PT, OT, aide, and soc work.  [facility] can accept.  Expected Discharge Plan: Skilled Nursing Facility Barriers to Discharge: Insurance Authorization  Expected Discharge Plan and Services Expected Discharge Plan: [place]   Discharge Planning Services: CM Consult   Living arrangements for the past 2 months: Single Family Home                                       Social Determinants of Health (SDOH) Interventions    Readmission Risk Interventions No flowsheet data found.

## 2020-10-28 ENCOUNTER — Telehealth: Payer: Self-pay

## 2020-10-28 NOTE — Telephone Encounter (Signed)
Transition Care Management Unsuccessful Follow-up Telephone Call  Date of discharge and from where:  10/27/2020 from [facility]  Attempts:  1st Attempt  Reason for unsuccessful TCM follow-up call:  Left voice message

## 2020-10-29 ENCOUNTER — Telehealth: Payer: Self-pay | Admitting: Internal Medicine

## 2020-10-29 DIAGNOSIS — D539 Nutritional anemia, unspecified: Secondary | ICD-10-CM | POA: Diagnosis not present

## 2020-10-29 DIAGNOSIS — Z79899 Other long term (current) drug therapy: Secondary | ICD-10-CM | POA: Diagnosis not present

## 2020-10-29 DIAGNOSIS — M5030 Other cervical disc degeneration, unspecified cervical region: Secondary | ICD-10-CM | POA: Diagnosis not present

## 2020-10-29 DIAGNOSIS — Z9181 History of falling: Secondary | ICD-10-CM | POA: Diagnosis not present

## 2020-10-29 DIAGNOSIS — N3281 Overactive bladder: Secondary | ICD-10-CM | POA: Diagnosis not present

## 2020-10-29 DIAGNOSIS — Z7982 Long term (current) use of aspirin: Secondary | ICD-10-CM | POA: Diagnosis not present

## 2020-10-29 DIAGNOSIS — I10 Essential (primary) hypertension: Secondary | ICD-10-CM | POA: Diagnosis not present

## 2020-10-29 DIAGNOSIS — F17210 Nicotine dependence, cigarettes, uncomplicated: Secondary | ICD-10-CM | POA: Diagnosis not present

## 2020-10-29 DIAGNOSIS — E785 Hyperlipidemia, unspecified: Secondary | ICD-10-CM | POA: Diagnosis not present

## 2020-10-29 NOTE — Telephone Encounter (Signed)
  prednisoLONE 5 MG TABS tablet Lori Oconnell states the pharmacy is not able to get this so they are wondering if she needs to be on prednisone. Patient was supposed to start this medication yesterday

## 2020-10-29 NOTE — Telephone Encounter (Signed)
I dont think so as several places in the chart it is documented"  - per GI, will need 8 week steroid course: "prednisolone 40 mg daily for 28 days. After 28 days, if improving, taper for 16 sday (decrease the dose by 10 mg per day every four days until a dose of 10 mg per day is reached, at which point, decrease it by 5 mg per day every three days)."

## 2020-10-29 NOTE — Telephone Encounter (Signed)
    [place] Name: [facility] Agency Name: [place] Phone #: [phone] Service Requested: PT Frequency of Visits: 2X1,1B5

## 2020-11-01 ENCOUNTER — Other Ambulatory Visit: Payer: Self-pay

## 2020-11-01 ENCOUNTER — Ambulatory Visit (INDEPENDENT_AMBULATORY_CARE_PROVIDER_SITE_OTHER): Payer: Medicare Other | Admitting: Internal Medicine

## 2020-11-01 ENCOUNTER — Encounter: Payer: Self-pay | Admitting: Internal Medicine

## 2020-11-01 VITALS — BP 132/80 | HR 104 | Temp 98.3°F | Ht 66.0 in | Wt 186.0 lb

## 2020-11-01 DIAGNOSIS — F418 Other specified anxiety disorders: Secondary | ICD-10-CM | POA: Diagnosis not present

## 2020-11-01 DIAGNOSIS — I10 Essential (primary) hypertension: Secondary | ICD-10-CM | POA: Diagnosis not present

## 2020-11-01 DIAGNOSIS — K7030 Alcoholic cirrhosis of liver without ascites: Secondary | ICD-10-CM | POA: Diagnosis not present

## 2020-11-01 MED ORDER — FUROSEMIDE 20 MG PO TABS: 20.0000 mg | ORAL_TABLET | Freq: Every day | ORAL | 11 refills | Status: DC

## 2020-11-01 MED ORDER — CITALOPRAM HYDROBROMIDE 20 MG PO TABS: 20.0000 mg | ORAL_TABLET | Freq: Every day | ORAL | 3 refills | Status: DC

## 2020-11-01 NOTE — Progress Notes (Signed)
Patient ID: Lori Oconnell, female   DOB: Jun 26, 1960, 60 y.o.   MRN: [id number]        Chief Complaint: follow up recent hospn/TCM and anxiety       HPI:  Lori Oconnell is a 60 y.o. female here after hospn may 9 - may 18 heavy alcohol abuse, HTN, HLD who presentsdue to family concern for altered mental status, tx for hepatic encephalopathy with lactulose and asked to f/u with GI Dr Therisa Doyne at 2 wks, pt does not currently have appt or know how to ask for one.  Pt was rx 8 wk course of prednisolone but pt not yet started as pharmacy does not stock.  K replaced, and low Na improved AKI improved.  Pt states o/w good compliance with med except has not taken lexapro for several weeks.  Her only complaints is worsening LE edema with several lbs wt gain.  Not taking any diuretic.     Wt Readings from Last 3 Encounters:  11/01/20 186 lb (84.4 kg)  10/19/20 169 lb 12.1 oz (77 kg)  06/12/19 177 lb (80.3 kg)   BP Readings from Last 3 Encounters:  11/01/20 132/80  10/27/20 116/73  06/12/19 (!) 148/88         Past Medical History:  Diagnosis Date  . Anxiety   . [place] DISEASE, CERVICAL 07/13/2009  . Edema 03/22/2011  . HEMATOCHEZIA 11/30/2009  . HYPERLIPIDEMIA 07/18/2009  . HYPERTENSION 07/12/2009  . Irregular menses 03/22/2011  . NECK PAIN 07/12/2009  . OVERACTIVE BLADDER 07/12/2009  . RENAL INSUFFICIENCY 07/12/2009  . SINUSITIS- ACUTE-NOS 07/12/2009  . Sleep apnea    prescribed cpap but has not gotten  . TRANSAMINASES, SERUM, ELEVATED 07/12/2009   Past Surgical History:  Procedure Laterality Date  . MASS EXCISION N/A 08/27/2012   Procedure: EXCISION MASS UPPER BACK;  Surgeon: Joyice Faster. Cornett, MD;  Location: [place];  Service: General;  Laterality: N/A;  . s/p ganglion cyst Right 1980  . TUBAL LIGATION  1990    reports that she has been smoking cigarettes. She has a 1.25 pack-year smoking history. She has quit using smokeless tobacco.  Her smokeless tobacco use included chew. She reports current alcohol use  of about 3.0 standard drinks of alcohol per week. She reports that she does not use drugs. family history includes Diabetes in her maternal aunt; Hypertension in her mother. No Known Allergies Current Outpatient Medications on File Prior to Visit  Medication Sig Dispense Refill  . aspirin 81 MG tablet Take 81 mg by mouth daily.    Marland Kitchen escitalopram (LEXAPRO) 10 MG tablet Take 1 tablet (10 mg total) by mouth daily. Must keep scheduled appt for future refills 30 tablet 0  . folic acid (FOLVITE) 1 MG tablet Take 1 tablet (1 mg total) by mouth daily. 90 tablet 0  . lactulose (CHRONULAC) 10 GM/15ML solution Take 15 mLs (10 g total) by mouth 2 (two) times daily. 946 mL 0  . Multiple Vitamin (MULTIVITAMIN WITH MINERALS) TABS tablet Take 1 tablet by mouth daily. 90 tablet 0  . prednisoLONE 5 MG TABS tablet Take 8 tablets (40 mg total) by mouth daily. 240 tablet 0  . thiamine 100 MG tablet Take 1 tablet (100 mg total) by mouth daily. 90 tablet 0  . Vitamin D, Ergocalciferol, (DRISDOL) 1.25 MG (50000 UT) CAPS capsule Take 1 capsule (50,000 Units total) by mouth every 7 (seven) days. 12 capsule 0   No current facility-administered medications on file prior to visit.  ROS:  All others reviewed and negative.  Objective        PE:  BP 132/80 (BP Location: Right Arm, Patient Position: Sitting, Cuff Size: Normal)   Pulse (!) 104   Temp 98.3 F (36.8 C) (Oral)   Ht 5\' 6"  (1.676 m)   Wt 186 lb (84.4 kg)   LMP 03/19/2011   SpO2 94%   BMI 30.02 kg/m                 Constitutional: Pt appears in NAD               HENT: Head: NCAT.                Right Ear: External ear normal.                 Left Ear: External ear normal.                Eyes: . Pupils are equal, round, and reactive to light. Conjunctivae and EOM are normal               Nose: without d/c or deformity               Neck: Neck supple. Gross normal ROM               Cardiovascular: Normal rate and regular rhythm.                  Pulmonary/Chest: Effort normal and breath sounds without rales or wheezing.                Abd:  Soft, NT, ND, + BS, no organomegaly               Neurological: Pt is alert. At baseline orientation, motor grossly intact               Skin: Skin is warm. No rashes, no other new lesions, LE edema -2+ bialt pedal and leg edema               Psychiatric: Pt behavior is normal without agitation   Micro: none  Cardiac tracings I have personally interpreted today:  none  Pertinent Radiological findings (summarize): none   Lab Results  Component Value Date   WBC 14.4 (H) 10/27/2020   HGB 7.3 (L) 10/27/2020   HCT 20.6 (L) 10/27/2020   PLT 105 (L) 10/27/2020   GLUCOSE 117 (H) 10/27/2020   CHOL 204 (H) 06/12/2019   TRIG 65.0 06/12/2019   HDL 87.40 06/12/2019   LDLDIRECT 119.0 11/04/2015   LDLCALC 104 (H) 06/12/2019   ALT 99 (H) 10/27/2020   AST 137 (H) 10/27/2020   NA 138 10/27/2020   K 3.7 10/27/2020   CL 109 10/27/2020   CREATININE 0.77 10/27/2020   BUN 34 (H) 10/27/2020   CO2 23 10/27/2020   TSH 1.14 06/12/2019   INR 1.9 (H) 10/27/2020   Assessment/Plan:  Lori Oconnell is a 60 y.o. Black or African American [2] female with  has a past medical history of Anxiety, DISC DISEASE, CERVICAL (07/13/2009), Edema (03/22/2011), HEMATOCHEZIA (11/30/2009), HYPERLIPIDEMIA (07/18/2009), HYPERTENSION (07/12/2009), Irregular menses (03/22/2011), NECK PAIN (07/12/2009), OVERACTIVE BLADDER (07/12/2009), RENAL INSUFFICIENCY (07/12/2009), SINUSITIS- ACUTE-NOS (07/12/2009), Sleep apnea, and TRANSAMINASES, SERUM, ELEVATED (07/12/2009).  Alcoholic cirrhosis of liver without ascites ([place]) Due for f/u GI f/u at 2 wks, will refer urgent, to start lasix 20 qd, consider add aldactone  Depression with anxiety For restart SSRI with celexa  20 qd,  to f/u any worsening symptoms or concerns  Essential hypertension BP Readings from Last 3 Encounters:  11/01/20 132/80  10/27/20 116/73  06/12/19 (!) 148/88   Stable, pt to  continue medical treatment  none   Followup: Return in about 3 weeks (around 11/22/2020).  Lori Cower, MD 11/02/2020 9:12 PM [facility]

## 2020-11-01 NOTE — Patient Instructions (Signed)
Please take all new medication as prescribed - the celexa 20 mg per day for nerves and lasix 20 mg per day for swelling  Please continue all other medications as before, and refills have been done if requested.  Please have the pharmacy call with any other refills you may need.  Please continue your efforts at being more active, low cholesterol diet, and weight control.  Please keep your appointments with your specialists as you may have planned  You will be contacted regarding the referral for: Dr Therisa Doyne - urgent  Please make an Appointment to return in 3 weeks

## 2020-11-02 ENCOUNTER — Encounter: Payer: Self-pay | Admitting: Internal Medicine

## 2020-11-02 NOTE — Assessment & Plan Note (Signed)
BP Readings from Last 3 Encounters:  11/01/20 132/80  10/27/20 116/73  06/12/19 (!) 148/88   Stable, pt to continue medical treatment  none

## 2020-11-02 NOTE — Assessment & Plan Note (Addendum)
Due for f/u GI f/u at 2 wks, will refer urgent, to start lasix 20 qd, consider add aldactone

## 2020-11-02 NOTE — Assessment & Plan Note (Signed)
For restart SSRI with celexa 20 qd,  to f/u any worsening symptoms or concerns

## 2020-11-03 DIAGNOSIS — Z9181 History of falling: Secondary | ICD-10-CM | POA: Diagnosis not present

## 2020-11-03 DIAGNOSIS — Z7982 Long term (current) use of aspirin: Secondary | ICD-10-CM | POA: Diagnosis not present

## 2020-11-03 DIAGNOSIS — N3281 Overactive bladder: Secondary | ICD-10-CM | POA: Diagnosis not present

## 2020-11-03 DIAGNOSIS — M5030 Other cervical disc degeneration, unspecified cervical region: Secondary | ICD-10-CM | POA: Diagnosis not present

## 2020-11-03 DIAGNOSIS — D539 Nutritional anemia, unspecified: Secondary | ICD-10-CM | POA: Diagnosis not present

## 2020-11-03 DIAGNOSIS — E785 Hyperlipidemia, unspecified: Secondary | ICD-10-CM | POA: Diagnosis not present

## 2020-11-03 DIAGNOSIS — Z79899 Other long term (current) drug therapy: Secondary | ICD-10-CM | POA: Diagnosis not present

## 2020-11-03 DIAGNOSIS — I10 Essential (primary) hypertension: Secondary | ICD-10-CM | POA: Diagnosis not present

## 2020-11-03 DIAGNOSIS — F17210 Nicotine dependence, cigarettes, uncomplicated: Secondary | ICD-10-CM | POA: Diagnosis not present

## 2020-11-04 ENCOUNTER — Other Ambulatory Visit: Payer: Self-pay | Admitting: Internal Medicine

## 2020-11-05 ENCOUNTER — Other Ambulatory Visit: Payer: Self-pay | Admitting: Gastroenterology

## 2020-11-05 DIAGNOSIS — D539 Nutritional anemia, unspecified: Secondary | ICD-10-CM | POA: Diagnosis not present

## 2020-11-05 DIAGNOSIS — Z9181 History of falling: Secondary | ICD-10-CM | POA: Diagnosis not present

## 2020-11-05 DIAGNOSIS — F17210 Nicotine dependence, cigarettes, uncomplicated: Secondary | ICD-10-CM | POA: Diagnosis not present

## 2020-11-05 DIAGNOSIS — M5030 Other cervical disc degeneration, unspecified cervical region: Secondary | ICD-10-CM | POA: Diagnosis not present

## 2020-11-05 DIAGNOSIS — I10 Essential (primary) hypertension: Secondary | ICD-10-CM | POA: Diagnosis not present

## 2020-11-05 DIAGNOSIS — N3281 Overactive bladder: Secondary | ICD-10-CM | POA: Diagnosis not present

## 2020-11-05 DIAGNOSIS — Z7982 Long term (current) use of aspirin: Secondary | ICD-10-CM | POA: Diagnosis not present

## 2020-11-05 DIAGNOSIS — E785 Hyperlipidemia, unspecified: Secondary | ICD-10-CM | POA: Diagnosis not present

## 2020-11-05 DIAGNOSIS — Z79899 Other long term (current) drug therapy: Secondary | ICD-10-CM | POA: Diagnosis not present

## 2020-11-09 DIAGNOSIS — I10 Essential (primary) hypertension: Secondary | ICD-10-CM | POA: Diagnosis not present

## 2020-11-09 DIAGNOSIS — Z7982 Long term (current) use of aspirin: Secondary | ICD-10-CM | POA: Diagnosis not present

## 2020-11-09 DIAGNOSIS — D539 Nutritional anemia, unspecified: Secondary | ICD-10-CM | POA: Diagnosis not present

## 2020-11-09 DIAGNOSIS — N3281 Overactive bladder: Secondary | ICD-10-CM | POA: Diagnosis not present

## 2020-11-09 DIAGNOSIS — F17210 Nicotine dependence, cigarettes, uncomplicated: Secondary | ICD-10-CM | POA: Diagnosis not present

## 2020-11-09 DIAGNOSIS — Z9181 History of falling: Secondary | ICD-10-CM | POA: Diagnosis not present

## 2020-11-09 DIAGNOSIS — Z79899 Other long term (current) drug therapy: Secondary | ICD-10-CM | POA: Diagnosis not present

## 2020-11-09 DIAGNOSIS — E785 Hyperlipidemia, unspecified: Secondary | ICD-10-CM | POA: Diagnosis not present

## 2020-11-09 DIAGNOSIS — M5030 Other cervical disc degeneration, unspecified cervical region: Secondary | ICD-10-CM | POA: Diagnosis not present

## 2020-11-11 DIAGNOSIS — Z7982 Long term (current) use of aspirin: Secondary | ICD-10-CM | POA: Diagnosis not present

## 2020-11-11 DIAGNOSIS — N3281 Overactive bladder: Secondary | ICD-10-CM | POA: Diagnosis not present

## 2020-11-11 DIAGNOSIS — I10 Essential (primary) hypertension: Secondary | ICD-10-CM | POA: Diagnosis not present

## 2020-11-11 DIAGNOSIS — E785 Hyperlipidemia, unspecified: Secondary | ICD-10-CM | POA: Diagnosis not present

## 2020-11-11 DIAGNOSIS — Z79899 Other long term (current) drug therapy: Secondary | ICD-10-CM | POA: Diagnosis not present

## 2020-11-11 DIAGNOSIS — M5030 Other cervical disc degeneration, unspecified cervical region: Secondary | ICD-10-CM | POA: Diagnosis not present

## 2020-11-11 DIAGNOSIS — F17210 Nicotine dependence, cigarettes, uncomplicated: Secondary | ICD-10-CM | POA: Diagnosis not present

## 2020-11-11 DIAGNOSIS — D539 Nutritional anemia, unspecified: Secondary | ICD-10-CM | POA: Diagnosis not present

## 2020-11-11 DIAGNOSIS — Z9181 History of falling: Secondary | ICD-10-CM | POA: Diagnosis not present

## 2020-11-16 DIAGNOSIS — D539 Nutritional anemia, unspecified: Secondary | ICD-10-CM | POA: Diagnosis not present

## 2020-11-16 DIAGNOSIS — M5030 Other cervical disc degeneration, unspecified cervical region: Secondary | ICD-10-CM | POA: Diagnosis not present

## 2020-11-16 DIAGNOSIS — Z7982 Long term (current) use of aspirin: Secondary | ICD-10-CM | POA: Diagnosis not present

## 2020-11-16 DIAGNOSIS — I10 Essential (primary) hypertension: Secondary | ICD-10-CM | POA: Diagnosis not present

## 2020-11-16 DIAGNOSIS — Z9181 History of falling: Secondary | ICD-10-CM | POA: Diagnosis not present

## 2020-11-16 DIAGNOSIS — F17210 Nicotine dependence, cigarettes, uncomplicated: Secondary | ICD-10-CM | POA: Diagnosis not present

## 2020-11-16 DIAGNOSIS — E785 Hyperlipidemia, unspecified: Secondary | ICD-10-CM | POA: Diagnosis not present

## 2020-11-16 DIAGNOSIS — Z79899 Other long term (current) drug therapy: Secondary | ICD-10-CM | POA: Diagnosis not present

## 2020-11-16 DIAGNOSIS — N3281 Overactive bladder: Secondary | ICD-10-CM | POA: Diagnosis not present

## 2020-11-17 DIAGNOSIS — F17210 Nicotine dependence, cigarettes, uncomplicated: Secondary | ICD-10-CM | POA: Diagnosis not present

## 2020-11-17 DIAGNOSIS — D539 Nutritional anemia, unspecified: Secondary | ICD-10-CM | POA: Diagnosis not present

## 2020-11-17 DIAGNOSIS — I10 Essential (primary) hypertension: Secondary | ICD-10-CM | POA: Diagnosis not present

## 2020-11-17 DIAGNOSIS — E785 Hyperlipidemia, unspecified: Secondary | ICD-10-CM | POA: Diagnosis not present

## 2020-11-17 DIAGNOSIS — Z9181 History of falling: Secondary | ICD-10-CM | POA: Diagnosis not present

## 2020-11-17 DIAGNOSIS — N3281 Overactive bladder: Secondary | ICD-10-CM | POA: Diagnosis not present

## 2020-11-17 DIAGNOSIS — M5030 Other cervical disc degeneration, unspecified cervical region: Secondary | ICD-10-CM | POA: Diagnosis not present

## 2020-11-17 DIAGNOSIS — Z7982 Long term (current) use of aspirin: Secondary | ICD-10-CM | POA: Diagnosis not present

## 2020-11-17 DIAGNOSIS — Z79899 Other long term (current) drug therapy: Secondary | ICD-10-CM | POA: Diagnosis not present

## 2020-11-18 DIAGNOSIS — M5030 Other cervical disc degeneration, unspecified cervical region: Secondary | ICD-10-CM | POA: Diagnosis not present

## 2020-11-18 DIAGNOSIS — Z7982 Long term (current) use of aspirin: Secondary | ICD-10-CM | POA: Diagnosis not present

## 2020-11-18 DIAGNOSIS — I10 Essential (primary) hypertension: Secondary | ICD-10-CM | POA: Diagnosis not present

## 2020-11-18 DIAGNOSIS — Z9181 History of falling: Secondary | ICD-10-CM | POA: Diagnosis not present

## 2020-11-18 DIAGNOSIS — N3281 Overactive bladder: Secondary | ICD-10-CM | POA: Diagnosis not present

## 2020-11-18 DIAGNOSIS — Z79899 Other long term (current) drug therapy: Secondary | ICD-10-CM | POA: Diagnosis not present

## 2020-11-18 DIAGNOSIS — D539 Nutritional anemia, unspecified: Secondary | ICD-10-CM | POA: Diagnosis not present

## 2020-11-18 DIAGNOSIS — E785 Hyperlipidemia, unspecified: Secondary | ICD-10-CM | POA: Diagnosis not present

## 2020-11-18 DIAGNOSIS — F17210 Nicotine dependence, cigarettes, uncomplicated: Secondary | ICD-10-CM | POA: Diagnosis not present

## 2020-11-22 ENCOUNTER — Ambulatory Visit (INDEPENDENT_AMBULATORY_CARE_PROVIDER_SITE_OTHER): Payer: Medicare Other | Admitting: Internal Medicine

## 2020-11-22 ENCOUNTER — Encounter: Payer: Self-pay | Admitting: Internal Medicine

## 2020-11-22 ENCOUNTER — Other Ambulatory Visit: Payer: Self-pay

## 2020-11-22 VITALS — BP 122/80 | HR 90 | Temp 98.2°F | Ht 66.0 in | Wt 179.0 lb

## 2020-11-22 DIAGNOSIS — F17210 Nicotine dependence, cigarettes, uncomplicated: Secondary | ICD-10-CM | POA: Diagnosis not present

## 2020-11-22 DIAGNOSIS — Z7982 Long term (current) use of aspirin: Secondary | ICD-10-CM | POA: Diagnosis not present

## 2020-11-22 DIAGNOSIS — F17200 Nicotine dependence, unspecified, uncomplicated: Secondary | ICD-10-CM | POA: Diagnosis not present

## 2020-11-22 DIAGNOSIS — I10 Essential (primary) hypertension: Secondary | ICD-10-CM

## 2020-11-22 DIAGNOSIS — E7800 Pure hypercholesterolemia, unspecified: Secondary | ICD-10-CM

## 2020-11-22 DIAGNOSIS — F418 Other specified anxiety disorders: Secondary | ICD-10-CM

## 2020-11-22 DIAGNOSIS — M5030 Other cervical disc degeneration, unspecified cervical region: Secondary | ICD-10-CM | POA: Diagnosis not present

## 2020-11-22 DIAGNOSIS — K7031 Alcoholic cirrhosis of liver with ascites: Secondary | ICD-10-CM | POA: Diagnosis not present

## 2020-11-22 DIAGNOSIS — Z0001 Encounter for general adult medical examination with abnormal findings: Secondary | ICD-10-CM | POA: Diagnosis not present

## 2020-11-22 DIAGNOSIS — D539 Nutritional anemia, unspecified: Secondary | ICD-10-CM | POA: Diagnosis not present

## 2020-11-22 DIAGNOSIS — E785 Hyperlipidemia, unspecified: Secondary | ICD-10-CM | POA: Diagnosis not present

## 2020-11-22 DIAGNOSIS — Z9181 History of falling: Secondary | ICD-10-CM | POA: Diagnosis not present

## 2020-11-22 DIAGNOSIS — N3281 Overactive bladder: Secondary | ICD-10-CM | POA: Diagnosis not present

## 2020-11-22 DIAGNOSIS — Z79899 Other long term (current) drug therapy: Secondary | ICD-10-CM | POA: Diagnosis not present

## 2020-11-22 NOTE — Progress Notes (Signed)
Patient ID: Lori Oconnell, female   DOB: [birth year], 60 y.o.   MRN: [id number]         Chief Complaint:: wellness exam and Follow-up (3 week follow up)  Leg swelling, depression anxiety, htn,  hld       HPI:  Lori Oconnell is a 60 y.o. female here for wellness exam; pt to call for GYN, declines covid booster for now, shingrix, pneumovax, mammogram, colonoscopy, and tdap; o/w up to date with preventive referrals and immunizations                        Also per GI now taking lasix and aldactone 100, with wt down 7 lbs.  Pt denies chest pain, increased sob or doe, wheezing, orthopnea, PND, increased LE swelling, palpitations, dizziness or syncope in fact leg swelling down quite a bit  Needs work note off may 9 to July 11.   Pt denies polydipsia, polyuria, or new focal neuro s/s.  No new complaints  Wt Readings from Last 3 Encounters:  11/22/20 179 lb (81.2 kg)  11/01/20 186 lb (84.4 kg)  10/19/20 169 lb 12.1 oz (77 kg)   BP Readings from Last 3 Encounters:  11/22/20 122/80  11/01/20 132/80  10/27/20 116/73   Immunization History  Administered Date(s) Administered   Influenza Split 03/22/2011   Influenza Whole 07/12/2009   Influenza, Quadrivalent, Recombinant, Inj, Pf 05/05/2019   Influenza,inj,Quad PF,6+ Mos 07/02/2013, 05/26/2016, 05/09/2017, 05/15/2018   PFIZER(Purple Top)SARS-COV-2 Vaccination 09/11/2019, 10/10/2019   Td 07/12/2009   Zoster Recombinat (Shingrix) 06/12/2019   There are no preventive care reminders to display for this patient.     Past Medical History:  Diagnosis Date   Anxiety    DISC DISEASE, CERVICAL 07/13/2009   Edema 03/22/2011   HEMATOCHEZIA 11/30/2009   HYPERLIPIDEMIA 07/18/2009   HYPERTENSION 07/12/2009   Irregular menses 03/22/2011   NECK PAIN 07/12/2009   OVERACTIVE BLADDER 07/12/2009   RENAL INSUFFICIENCY 07/12/2009   SINUSITIS- ACUTE-NOS 07/12/2009   Sleep apnea    prescribed cpap but has not gotten   TRANSAMINASES, SERUM, ELEVATED 07/12/2009   Past  Surgical History:  Procedure Laterality Date   MASS EXCISION N/A 08/27/2012   Procedure: EXCISION MASS UPPER BACK;  Surgeon: Joyice Faster. Cornett, MD;  Location: [facility];  Service: General;  Laterality: N/A;   s/p ganglion cyst Right [place]    reports that she has been smoking cigarettes. She has a 1.25 pack-year smoking history. She has quit using smokeless tobacco.  Her smokeless tobacco use included chew. She reports current alcohol use of about 3.0 standard drinks of alcohol per week. She reports that she does not use drugs. family history includes Diabetes in her maternal aunt; Hypertension in her mother. No Known Allergies Current Outpatient Medications on File Prior to Visit  Medication Sig Dispense Refill   Carboxymethylcellul-Glycerin (CLEAR EYES FOR DRY EYES) 1-0.25 % SOLN Place 1 drop into both eyes daily as needed (dry eyes).     citalopram (CELEXA) 20 MG tablet Take 1 tablet (20 mg total) by mouth daily. 90 tablet 3   escitalopram (LEXAPRO) 10 MG tablet Take 1 tablet (10 mg total) by mouth daily. Must keep scheduled appt for future refills 30 tablet 0   folic acid (FOLVITE) 1 MG tablet Take 1 tablet (1 mg total) by mouth daily. 90 tablet 0   furosemide (LASIX) 40 MG tablet Take 40 mg by mouth.  lactulose (CHRONULAC) 10 GM/15ML solution Take 15 mLs (10 g total) by mouth 2 (two) times daily. 946 mL 0   Multiple Vitamin (MULTIVITAMIN WITH MINERALS) TABS tablet Take 1 tablet by mouth daily. 90 tablet 0   prednisoLONE 5 MG TABS tablet Take 8 tablets (40 mg total) by mouth daily. 240 tablet 0   spironolactone (ALDACTONE) 100 MG tablet Take 100 mg by mouth daily.     thiamine 100 MG tablet Take 1 tablet (100 mg total) by mouth daily. 90 tablet 0   Vitamin D, Ergocalciferol, (DRISDOL) 1.25 MG (50000 UT) CAPS capsule Take 1 capsule (50,000 Units total) by mouth every 7 (seven) days. 12 capsule 0   polyethylene glycol-electrolytes (NULYTELY) 420 g solution See admin  instructions.     No current facility-administered medications on file prior to visit.        ROS:  All others reviewed and negative.  Objective        PE:  BP 122/80 (BP Location: Left Arm, Patient Position: Sitting, Cuff Size: Normal)   Pulse 90   Temp 98.2 F (36.8 C) (Oral)   Ht 5\' 6"  (1.676 m)   Wt 179 lb (81.2 kg)   LMP 03/19/2011   SpO2 98%   BMI 28.89 kg/m                 Constitutional: Pt appears in NAD               HENT: Head: NCAT.                Right Ear: External ear normal.                 Left Ear: External ear normal.                Eyes: . Pupils are equal, round, and reactive to light. Conjunctivae and EOM are normal               Nose: without d/c or deformity               Neck: Neck supple. Gross normal ROM               Cardiovascular: Normal rate and regular rhythm.                 Pulmonary/Chest: Effort normal and breath sounds without rales or wheezing.                Abd:  Soft, NT, ND, + BS, no organomegaly               Neurological: Pt is alert. At baseline orientation, motor grossly intact               Skin: Skin is warm. No rashes, no other new lesions, LE edema - trac eto 1+ bilat               Psychiatric: Pt behavior is normal without agitation   Micro: none  Cardiac tracings I have personally interpreted today:  none  Pertinent Radiological findings (summarize): none   Lab Results  Component Value Date   WBC 14.4 (H) 10/27/2020   HGB 7.3 (L) 10/27/2020   HCT 20.6 (L) 10/27/2020   PLT 105 (L) 10/27/2020   GLUCOSE 117 (H) 10/27/2020   CHOL 204 (H) 06/12/2019   TRIG 65.0 06/12/2019   HDL 87.40 06/12/2019   LDLDIRECT 119.0 11/04/2015   LDLCALC 104 (H) 06/12/2019   ALT  99 (H) 10/27/2020   AST 137 (H) 10/27/2020   NA 138 10/27/2020   K 3.7 10/27/2020   CL 109 10/27/2020   CREATININE 0.77 10/27/2020   BUN 34 (H) 10/27/2020   CO2 23 10/27/2020   TSH 1.14 06/12/2019   INR 1.9 (H) 10/27/2020   Assessment/Plan:  Lori Oconnell  is a 60 y.o. Black or African American [2] female with  has a past medical history of Anxiety, DISC DISEASE, CERVICAL (07/13/2009), Edema (03/22/2011), HEMATOCHEZIA (11/30/2009), HYPERLIPIDEMIA (07/18/2009), HYPERTENSION (07/12/2009), Irregular menses (03/22/2011), NECK PAIN (07/12/2009), OVERACTIVE BLADDER (07/12/2009), RENAL INSUFFICIENCY (07/12/2009), SINUSITIS- ACUTE-NOS (07/12/2009), Sleep apnea, and TRANSAMINASES, SERUM, ELEVATED (07/12/2009).  Encounter for well adult exam with abnormal findings Age and sex appropriate education and counseling updated with regular exercise and diet Referrals for preventative services - pt plans to call for pap, mamogram, colonoscopy Immunizations addressed - declines covid booster, shignrx, pneumova, tdap Smoking counseling  - counseled to quit, pt not ready Evidence for depression or other mood disorder - none significant Most recent labs reviewed. I have personally reviewed and have noted: 1) the patient's medical and social history 2) The patient's current medications and supplements 3) The patient's height, weight, and BMI have been recorded in the chart   Hyperlipidemia Lab Results  Component Value Date   LDLCALC 104 (H) 06/12/2019   Mild uncontrolled, goal ldl  < 100, pt to continue current low chol diet, declines statin due to liver dz   Essential hypertension BP Readings from Last 3 Encounters:  11/22/20 122/80  11/01/20 132/80  10/27/20 116/73   Stable, pt to continue medical treatment  - lasix, aldactone   Depression with anxiety Pt states overall improved on new celexa, cont current 20 mg daily, declines need for counseling or psychiatry for now  Cirrhosis of liver with ascites ([place]) With volume improved on lasix/aldacatone,  to f/u any worsening symptoms or concerns; for work note may 9 - July 11  Smoker Pt counseled to quit, not ready at this time  Followup: Return in about 6 months (around 05/24/2021).  Lori Cower, MD 11/26/2020  11:15 PM [facility]

## 2020-11-22 NOTE — Patient Instructions (Signed)
Please continue all other medications as before, and refills have been done if requested.  Please have the pharmacy call with any other refills you may need.  Please continue your efforts at being more active, low cholesterol diet, and weight control.  You are otherwise up to date with prevention measures today.  Please keep your appointments with your specialists as you may have planned  We can hold on further lab tests today  You are given the work note  Please make an Appointment to return in 6 months, or sooner if needed

## 2020-11-23 ENCOUNTER — Telehealth: Payer: Self-pay | Admitting: Internal Medicine

## 2020-11-23 ENCOUNTER — Encounter (HOSPITAL_COMMUNITY): Admission: RE | Payer: Self-pay | Source: Home / Self Care

## 2020-11-23 ENCOUNTER — Ambulatory Visit (HOSPITAL_COMMUNITY): Admission: RE | Admit: 2020-11-23 | Payer: Medicare Other | Source: Home / Self Care | Admitting: Gastroenterology

## 2020-11-23 SURGERY — COLONOSCOPY WITH PROPOFOL
Anesthesia: Monitor Anesthesia Care

## 2020-11-23 NOTE — Telephone Encounter (Signed)
Ok to let pt know I believe she is taking lactulose which is supposed to cause incresed bowel movements to get rid of ammonia.  She may wish to ask GI about how the lactulose is currently prescribed, and perhaps this can be adjusted

## 2020-11-23 NOTE — Telephone Encounter (Signed)
Patient called and was wondering what she could do or take to decrease her daily bowel movements. She said that she goes 6 or more times a day. She said that it is the same at night. She can be reached at [phone]. Please advise

## 2020-11-25 NOTE — Telephone Encounter (Signed)
Left message for patient to call me back. 

## 2020-11-26 ENCOUNTER — Encounter: Payer: Self-pay | Admitting: Internal Medicine

## 2020-11-26 DIAGNOSIS — F17210 Nicotine dependence, cigarettes, uncomplicated: Secondary | ICD-10-CM | POA: Diagnosis not present

## 2020-11-26 DIAGNOSIS — M5030 Other cervical disc degeneration, unspecified cervical region: Secondary | ICD-10-CM | POA: Diagnosis not present

## 2020-11-26 DIAGNOSIS — Z9181 History of falling: Secondary | ICD-10-CM | POA: Diagnosis not present

## 2020-11-26 DIAGNOSIS — I10 Essential (primary) hypertension: Secondary | ICD-10-CM | POA: Diagnosis not present

## 2020-11-26 DIAGNOSIS — F17200 Nicotine dependence, unspecified, uncomplicated: Secondary | ICD-10-CM | POA: Insufficient documentation

## 2020-11-26 DIAGNOSIS — N3281 Overactive bladder: Secondary | ICD-10-CM | POA: Diagnosis not present

## 2020-11-26 DIAGNOSIS — D539 Nutritional anemia, unspecified: Secondary | ICD-10-CM | POA: Diagnosis not present

## 2020-11-26 DIAGNOSIS — Z7982 Long term (current) use of aspirin: Secondary | ICD-10-CM | POA: Diagnosis not present

## 2020-11-26 DIAGNOSIS — Z79899 Other long term (current) drug therapy: Secondary | ICD-10-CM | POA: Diagnosis not present

## 2020-11-26 DIAGNOSIS — E785 Hyperlipidemia, unspecified: Secondary | ICD-10-CM | POA: Diagnosis not present

## 2020-11-26 NOTE — Assessment & Plan Note (Addendum)
With volume improved on lasix/aldacatone,  to f/u any worsening symptoms or concerns; for work note may 9 - July 11

## 2020-11-26 NOTE — Telephone Encounter (Signed)
Patient notified

## 2020-11-26 NOTE — Assessment & Plan Note (Addendum)
Lab Results  Component Value Date   LDLCALC 104 (H) 06/12/2019   Mild uncontrolled, goal ldl  < 100, pt to continue current low chol diet, declines statin due to liver dz

## 2020-11-26 NOTE — Assessment & Plan Note (Signed)
BP Readings from Last 3 Encounters:  11/22/20 122/80  11/01/20 132/80  10/27/20 116/73   Stable, pt to continue medical treatment  - lasix, aldactone

## 2020-11-26 NOTE — Assessment & Plan Note (Signed)
Pt counseled to quit, not ready at this time

## 2020-11-26 NOTE — Assessment & Plan Note (Signed)
Age and sex appropriate education and counseling updated with regular exercise and diet Referrals for preventative services - pt plans to call for pap, mamogram, colonoscopy Immunizations addressed - declines covid booster, shignrx, pneumova, tdap Smoking counseling  - counseled to quit, pt not ready Evidence for depression or other mood disorder - none significant Most recent labs reviewed. I have personally reviewed and have noted: 1) the patient's medical and social history 2) The patient's current medications and supplements 3) The patient's height, weight, and BMI have been recorded in the chart

## 2020-11-26 NOTE — Assessment & Plan Note (Signed)
Pt states overall improved on new celexa, cont current 20 mg daily, declines need for counseling or psychiatry for now

## 2021-01-06 ENCOUNTER — Other Ambulatory Visit: Payer: Self-pay | Admitting: Gastroenterology

## 2021-01-06 DIAGNOSIS — D649 Anemia, unspecified: Secondary | ICD-10-CM | POA: Diagnosis not present

## 2021-01-06 DIAGNOSIS — D1720 Benign lipomatous neoplasm of skin and subcutaneous tissue of unspecified limb: Secondary | ICD-10-CM | POA: Diagnosis not present

## 2021-01-06 DIAGNOSIS — K7031 Alcoholic cirrhosis of liver with ascites: Secondary | ICD-10-CM

## 2021-01-10 NOTE — H&P (Signed)
History of Present Illness  General:  60 year old female, last seen in [id number] alcoholic cirrhosis with ascites, alcoholic hepatitis, macrocytic anemia, lipoma of shoulders prior workup showed negative hepatitis B and C serology, labs normal for autoimmune hepatitis and primary biliary cholangitis, normal iron panel, normal alpha-1 antitrypsin, normal ceruloplasmin negative cologuard in 2019 last visit she reported not drinking alcohol since 10/18/20 Labs from 12/24/20 showed  PT 12.4, INR 1.2 hemoglobin 9.2, MCV 103.1, platelet 209 TB/AST/ALT/ALP of 2.7/20/8/90 potassium 3.4, sodium 139, BUN 8, creatinine 0.70, GFR 88 alpha-fetoprotein was 6.75/22 MELDNa from 7/22 is 12 She has a BM 3/day, denies blood in stool or black stools, stools are formed mostly, denies abdominal or rectal pain. She has a good appetite and denies nausea or vomiting, denies acid reflux or heartburn, denies difficulty or pain on swallowing. She believes that the swelling in her legs have improved but continues to abdominal distension.   Current Medications  Taking   Folic Acid 1 MG Tablet 1 tablet Orally Once a day  Furosemide 40 MG Tablet 1 tablet Orally Once a day  Citalopram Hydrobromide 20 MG Tablet 1 tablet Orally Once a day  Vitamin B-1 100 MG Tablet 1 tablet Orally Once a day  OTC . . , Notes: mutivitamin 1x a ady  Spironolactone 100 MG Tablet 1 tablet Orally Once a day  Not-Taking   Lactulose 10 GM/15ML Solution 15 ml Orally Once a day  amLODIPine Besylate 5 MG Tablet TAKE 1 TABLET BY MOUTH EVERY DAY Oral   Escitalopram Oxalate 10 MG Tablet TAKE 1 TABLET BY MOUTH EVERY DAY Oral   Solifenacin Succinate 5 MG Tablet TAKE 1 TABLET BY MOUTH EVERY DAY Oral   Medication List reviewed and reconciled with the patient   Past Medical History  Anxiety.   Disc Disease; Cervical.   Edema.   HLD.   HTN.   Overactive bladder.   Sleep Apnea.    Surgical History  Mass excision; upper back 2014  ganglion  cyst 1980  Tubal Ligation 1990   Family History  No Family History of Colon Cancer, Polyps, or Liver Disease.   Social History  General:  Tobacco use  cigarettes: Current smoker Tobacco history last updated 01/06/2021 no Alcohol.  no Recreational drug use.    Allergies  N.K.D.A.   Hospitalization/Major Diagnostic Procedure  10/18/2020 everything was low passed out 12/2020   Review of Systems  GI PROCEDURE:  no Pacemaker/ AICD, no. no Artificial heart valves. no MI/heart attack. no Abnormal heart rhythm. no Angina. no CVA. Hypertension YES. no Hypotension. no Asthma, COPD. Sleep apnea YES. no Seizure disorders. no Artificial joints. no Severe DJD. no Diabetes. no Significant headaches. no Vertigo. Depression/anxiety YES. no Abnormal bleeding. no Kidney Disease. no Liver disease, no. no Chance of pregnancy. no Blood transfusion. no Method of Birth Control. no Birth control pills.     Vital Signs  Wt 166.2, Wt change -10.4 lb, Ht 65, BMI 27.65, Temp 97.7, Pulse sitting 107, BP sitting 127/76.   Examination  Gastroenterology:: GENERAL APPEARANCE: Well developed, well nourished, no active distress, pleasant.  SCLERA: anicteric.  CARDIOVASCULAR Normal RRR .  RESPIRATORY Breath sounds normal. Respiration even and unlabored.  ABDOMEN No masses palpated. Liver and spleen not palpated, normal. Bowel sounds normal, Abdomen not distended.  EXTREMITIES: large lipomatous shoulders, mild pitting pedal edema(markedly improved).  NEURO: alert, oriented to time, place and person, normal gait, no asterixis.  PSYCH: mood/affect normal.     Assessments     1.  Alcoholic cirrhosis of liver with ascites - K70.31 (Primary)   2. Anemia, unspecified type - D64.9   3. 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 Alcoholic cirrhosis of liver with ascites  IMAGING: US ABDOMEN LIMITED RUQ/ASCITES    Powell,Amy 01/06/2021 03:03:52 PM > Pt scheduled at [facility].  Faxed order.  IMAGING: EGD with Band Ligation of Varices    Powell,Amy 01/06/2021 03:06:05 PM > Pt scheduled for Colon/EGD 8/2 [facility], gave instructions, rx, consents. Delilah Shan [id number]   Notes: Doing well, has not had alcohol since 5/22, MELDNa is reassuring, will need EGD for variceal screening,and banding if needed. Will get USG for evaluation for ascites. Continue furosemide 40 mg and spironolactone 100 mg daily meanwhile.    2. Anemia, unspecified type  IMAGING: Colonoscopy    Powell,Amy 01/06/2021 03:06:05 PM > Pt scheduled for Colon/EGD 8/2 [facility], gave instructions, rx, consents. Delilah Shan [id number]   Notes: Recommend diagnostic colonoscopy at [facility] when she is scheduled for EGD with possible banding.    3. Lipoma of upper extremity, unspecified laterality  Notes: Surgical referral- large lipomatous shoulders.

## 2021-01-10 NOTE — Anesthesia Preprocedure Evaluation (Addendum)
Anesthesia Evaluation  Patient identified by MRN, date of birth, ID band Patient awake    Reviewed: Allergy & Precautions, NPO status , Patient's Chart, lab work & pertinent test results  Airway Mallampati: II  TM Distance: >3 FB Neck ROM: Full    Dental no notable dental hx. (+) Dental Advisory Given, Poor Dentition,    Pulmonary sleep apnea , Current Smoker,    Pulmonary exam normal breath sounds clear to auscultation       Cardiovascular hypertension, Pt. on medications Normal cardiovascular exam Rhythm:Regular Rate:Normal     Neuro/Psych Anxiety    GI/Hepatic (+) Cirrhosis     substance abuse  alcohol use, Hepatitis -  Endo/Other    Renal/GU      Musculoskeletal   Abdominal   Peds  Hematology  (+) anemia , Lab Results      Component                Value               Date                      WBC                      14.4 (H)            10/27/2020                HGB                      7.3 (L)             10/27/2020                HCT                      20.6 (L)            10/27/2020                MCV                      115.7 (H)           10/27/2020                PLT                      105 (L)             10/27/2020              Anesthesia Other Findings   Reproductive/Obstetrics                            Anesthesia Physical Anesthesia Plan  ASA: 3  Anesthesia Plan: MAC   Post-op Pain Management:    Induction:   PONV Risk Score and Plan: Treatment may vary due to age or medical condition  Airway Management Planned: Natural Airway  Additional Equipment: None  Intra-op Plan:   Post-operative Plan:   Informed Consent: I have reviewed the patients History and Physical, chart, labs and discussed the procedure including the risks, benefits and alternatives for the proposed anesthesia with the patient or authorized representative who has indicated his/her  understanding and acceptance.     Dental advisory given  Plan Discussed with: CRNA  Anesthesia Plan Comments: (EGD  Colon for Anemia)       Anesthesia Quick Evaluation

## 2021-01-11 ENCOUNTER — Encounter (HOSPITAL_COMMUNITY)
Admission: RE | Disposition: A | Discharge status: Home / Self Care | Payer: Self-pay | Source: Home / Self Care | Attending: Gastroenterology

## 2021-01-11 ENCOUNTER — Other Ambulatory Visit: Payer: Self-pay

## 2021-01-11 ENCOUNTER — Encounter (HOSPITAL_COMMUNITY): Payer: Self-pay | Admitting: Gastroenterology

## 2021-01-11 ENCOUNTER — Ambulatory Visit (HOSPITAL_COMMUNITY)
Admission: RE | Admit: 2021-01-11 | Discharge: 2021-01-11 | Disposition: A | Discharge status: Home / Self Care | Payer: Medicare Other | Attending: Gastroenterology | Admitting: Gastroenterology

## 2021-01-11 ENCOUNTER — Ambulatory Visit (HOSPITAL_COMMUNITY): Payer: Medicare Other | Admitting: Anesthesiology

## 2021-01-11 DIAGNOSIS — Z79899 Other long term (current) drug therapy: Secondary | ICD-10-CM | POA: Insufficient documentation

## 2021-01-11 DIAGNOSIS — K635 Polyp of colon: Secondary | ICD-10-CM | POA: Diagnosis not present

## 2021-01-11 DIAGNOSIS — N3281 Overactive bladder: Secondary | ICD-10-CM | POA: Insufficient documentation

## 2021-01-11 DIAGNOSIS — K7460 Unspecified cirrhosis of liver: Secondary | ICD-10-CM | POA: Diagnosis not present

## 2021-01-11 DIAGNOSIS — G4730 Sleep apnea, unspecified: Secondary | ICD-10-CM | POA: Diagnosis not present

## 2021-01-11 DIAGNOSIS — D509 Iron deficiency anemia, unspecified: Secondary | ICD-10-CM | POA: Diagnosis not present

## 2021-01-11 DIAGNOSIS — K766 Portal hypertension: Secondary | ICD-10-CM | POA: Insufficient documentation

## 2021-01-11 DIAGNOSIS — E785 Hyperlipidemia, unspecified: Secondary | ICD-10-CM | POA: Diagnosis not present

## 2021-01-11 DIAGNOSIS — K3189 Other diseases of stomach and duodenum: Secondary | ICD-10-CM | POA: Insufficient documentation

## 2021-01-11 DIAGNOSIS — I10 Essential (primary) hypertension: Secondary | ICD-10-CM | POA: Insufficient documentation

## 2021-01-11 DIAGNOSIS — K7031 Alcoholic cirrhosis of liver with ascites: Secondary | ICD-10-CM | POA: Diagnosis not present

## 2021-01-11 DIAGNOSIS — K648 Other hemorrhoids: Secondary | ICD-10-CM | POA: Insufficient documentation

## 2021-01-11 DIAGNOSIS — F17200 Nicotine dependence, unspecified, uncomplicated: Secondary | ICD-10-CM | POA: Insufficient documentation

## 2021-01-11 DIAGNOSIS — K6389 Other specified diseases of intestine: Secondary | ICD-10-CM | POA: Diagnosis not present

## 2021-01-11 DIAGNOSIS — D1720 Benign lipomatous neoplasm of skin and subcutaneous tissue of unspecified limb: Secondary | ICD-10-CM | POA: Diagnosis not present

## 2021-01-11 HISTORY — PX: COLONOSCOPY WITH PROPOFOL: SHX5780

## 2021-01-11 HISTORY — PX: POLYPECTOMY: SHX5525

## 2021-01-11 HISTORY — PX: ESOPHAGOGASTRODUODENOSCOPY (EGD) WITH PROPOFOL: SHX5813

## 2021-01-11 SURGERY — COLONOSCOPY WITH PROPOFOL
Anesthesia: Monitor Anesthesia Care

## 2021-01-11 MED ORDER — PROPOFOL 500 MG/50ML IV EMUL
INTRAVENOUS | Status: DC | PRN
  Administered 2021-01-11: 160 ug/kg/min via INTRAVENOUS

## 2021-01-11 MED ORDER — LACTATED RINGERS IV SOLN: INTRAVENOUS | Status: DC

## 2021-01-11 MED ORDER — LIDOCAINE HCL (CARDIAC) PF 100 MG/5ML IV SOSY
PREFILLED_SYRINGE | INTRAVENOUS | Status: DC | PRN
  Administered 2021-01-11: 100 mg via INTRAVENOUS

## 2021-01-11 MED ORDER — SODIUM CHLORIDE 0.9 % IV SOLN: INTRAVENOUS | Status: DC

## 2021-01-11 MED ORDER — PROPOFOL 10 MG/ML IV BOLUS
INTRAVENOUS | Status: DC | PRN
  Administered 2021-01-11: 60 mg via INTRAVENOUS

## 2021-01-11 SURGICAL SUPPLY — 24 items

## 2021-01-11 NOTE — Transfer of Care (Signed)
Immediate Anesthesia Transfer of Care Note  Patient: Lori Oconnell  Procedure(s) Performed: COLONOSCOPY WITH PROPOFOL ESOPHAGOGASTRODUODENOSCOPY (EGD) WITH PROPOFOL POLYPECTOMY  Patient Location: PACU and Endoscopy Unit  Anesthesia Type:MAC  Level of Consciousness: awake, alert  and oriented  Airway & Oxygen Therapy: Patient Spontanous Breathing and Patient connected to face mask oxygen  Post-op Assessment: Post -op Vital signs reviewed and stable  Post vital signs: Reviewed and stable  Last Vitals:  Vitals Value Taken Time  BP 112/60 01/11/21 0926  Temp    Pulse 95 01/11/21 0926  Resp 19 01/11/21 0926  SpO2 100 % 01/11/21 0926    Last Pain:  Vitals:   01/11/21 0819  TempSrc: Oral  PainSc: 0-No pain         Complications: No notable events documented.

## 2021-01-11 NOTE — Interval H&P Note (Signed)
History and Physical Interval Note: 60/female with cirrhosis,anemia, for an EGD for variceal screening and banding if needed and colonoscopy. 01/11/2021 8:54 AM  Lori Oconnell  has presented today for EGD with possible banding and colonoscopy, with the diagnosis of Anemia.  The various methods of treatment have been discussed with the patient and family. After consideration of risks, benefits and other options for treatment, the patient has consented to  Procedure(s): COLONOSCOPY WITH PROPOFOL (N/A) ESOPHAGOGASTRODUODENOSCOPY (EGD) WITH PROPOFOL (N/A) as a surgical intervention.  The patient's history has been reviewed, patient examined, no change in status, stable for surgery.  I have reviewed the patient's chart and labs.  Questions were answered to the patient's satisfaction.     [name]

## 2021-01-11 NOTE — Op Note (Signed)
[facility] Patient Name: Lori Oconnell Procedure Date: 01/11/2021 MRN: [id number] Attending MD: [name] , MD Date of Birth: 1960-08-18 CSN: [id number] Age: 60 Admit Type: Outpatient Procedure:                Colonoscopy Indications:              This is the patient's first colonoscopy, Iron                            deficiency anemia Providers:                [name], MD, Doristine Johns, RN, Hinton Dyer,                            Stephanie [place] CRNA Referring MD:             Waynard Edwards Medicines:                Monitored Anesthesia Care Complications:            No immediate complications. Estimated blood loss:                            Minimal. Estimated Blood Loss:     Estimated blood loss was minimal. Procedure:                Pre-Anesthesia Assessment:                           - Prior to the procedure, a History and Physical                            was performed, and patient medications and                            allergies were reviewed. The patient's tolerance of                            previous anesthesia was also reviewed. The risks                            and benefits of the procedure and the sedation                            options and risks were discussed with the patient.                            All questions were answered, and informed consent                            was obtained. Prior Anticoagulants: The patient has                            taken no previous anticoagulant or antiplatelet                            agents. ASA Grade Assessment:  III - A patient with                            severe systemic disease. After reviewing the risks                            and benefits, the patient was deemed in                            satisfactory condition to undergo the procedure.                           - Prior to the procedure, a History and Physical                            was performed, and patient  medications and                            allergies were reviewed. The patient's tolerance of                            previous anesthesia was also reviewed. The risks                            and benefits of the procedure and the sedation                            options and risks were discussed with the patient.                            All questions were answered, and informed consent                            was obtained. Prior Anticoagulants: The patient has                            taken no previous anticoagulant or antiplatelet                            agents. ASA Grade Assessment: III - A patient with                            severe systemic disease. After reviewing the risks                            and benefits, the patient was deemed in                            satisfactory condition to undergo the procedure.                           After obtaining informed consent, the colonoscope  was passed under direct vision. Throughout the                            procedure, the patient's blood pressure, pulse, and                            oxygen saturations were monitored continuously. The                            [id number] Olympus colonoscope was                            introduced through the anus and advanced to the the                            cecum, identified by appendiceal orifice and                            ileocecal valve. The colonoscopy was performed                            without difficulty. The patient tolerated the                            procedure well. The quality of the bowel                            preparation was fair. Scope In: 9:09:57 AM Scope Out: 9:20:03 AM Scope Withdrawal Time: 0 hours 7 minutes 41 seconds  Total Procedure Duration: 0 hours 10 minutes 6 seconds  Findings:      Hemorrhoids were found on perianal exam.      A 4 mm polyp was found in the transverse colon. The polyp  was sessile.       The polyp was removed with a cold biopsy forceps. Resection and       retrieval were complete.      A moderate amount of semi-solid stool was found in the sigmoid colon, in       the transverse colon, in the ascending colon and in the cecum, making       visualization difficult. Lavage of the area was performed, resulting in       clearance with fair visualization.      Non-bleeding internal hemorrhoids were found during retroflexion.      The exam was otherwise without abnormality. Impression:               - Preparation of the colon was fair.                           - Hemorrhoids found on perianal exam.                           - One 4 mm polyp in the transverse colon, removed                            with a cold biopsy forceps. Resected and  retrieved.                           - Stool in the sigmoid colon, in the transverse                            colon, in the ascending colon and in the cecum.                           - Non-bleeding internal hemorrhoids.                           - The examination was otherwise normal. Moderate Sedation:      Patient did not receive moderate sedation for this procedure, but       instead received monitored anesthesia care. Recommendation:           - Patient has a contact number available for                            emergencies. The signs and symptoms of potential                            delayed complications were discussed with the                            patient. Return to normal activities tomorrow.                            Written discharge instructions were provided to the                            patient.                           - Resume regular diet.                           - Continue present medications.                           - Await pathology results.                           - Repeat colonoscopy for surveillance based on                            pathology results. Procedure Code(s):         --- Professional ---                           [phone], Colonoscopy, flexible; with biopsy, single                            or multiple Diagnosis Code(s):        --- Professional ---  K64.8, Other hemorrhoids                           K63.5, Polyp of colon                           D50.9, Iron deficiency anemia, unspecified CPT copyright 2019 American Medical Association. All rights reserved. The codes documented in this report are preliminary and upon coder review may  be revised to meet current compliance requirements. [name], MD 01/11/2021 9:28:32 AM This report has been signed electronically. Number of Addenda: 0

## 2021-01-11 NOTE — Op Note (Signed)
[facility] Patient Name: Lori Oconnell Procedure Date: 01/11/2021 MRN: [id number] Attending MD: [name] , MD Date of Birth: May 05, 1961 CSN: [id number] Age: 60 Admit Type: Outpatient Procedure:                Upper GI endoscopy Indications:              Cirrhosis rule out esophageal varices Providers:                [name], MD, Doristine Johns, RN, Hinton Dyer,                            Stephanie [place] CRNA Referring MD:             Waynard Edwards Medicines:                Monitored Anesthesia Care Complications:            No immediate complications. Estimated blood loss:                            None. Estimated Blood Loss:     Estimated blood loss: none. Procedure:                Pre-Anesthesia Assessment:                           - Prior to the procedure, a History and Physical                            was performed, and patient medications and                            allergies were reviewed. The patient's tolerance of                            previous anesthesia was also reviewed. The risks                            and benefits of the procedure and the sedation                            options and risks were discussed with the patient.                            All questions were answered, and informed consent                            was obtained. Prior Anticoagulants: The patient has                            taken no previous anticoagulant or antiplatelet                            agents. ASA Grade Assessment: III - A patient with  severe systemic disease. After reviewing the risks                            and benefits, the patient was deemed in                            satisfactory condition to undergo the procedure.                           After obtaining informed consent, the endoscope was                            passed under direct vision. Throughout the                            procedure, the  patient's blood pressure, pulse, and                            oxygen saturations were monitored continuously. The                            [id number] Olympus endoscope was introduced                            through the mouth, and advanced to the second part                            of duodenum. The upper GI endoscopy was                            accomplished without difficulty. The patient                            tolerated the procedure well. Scope In: Scope Out: Findings:      The examined esophagus was normal.      The Z-line was regular and was found 35 cm from the incisors.      Mild portal hypertensive gastropathy was found in the entire examined       stomach.      The cardia and gastric fundus were normal on retroflexion.      The examined duodenum was normal. Impression:               - Normal esophagus.                           - Z-line regular, 35 cm from the incisors.                           - Portal hypertensive gastropathy.                           - Normal examined duodenum.                           - No specimens collected. Moderate Sedation:  Patient did not receive moderate sedation for this procedure, but       instead received monitored anesthesia care. Recommendation:           - Patient has a contact number available for                            emergencies. The signs and symptoms of potential                            delayed complications were discussed with the                            patient. Return to normal activities tomorrow.                            Written discharge instructions were provided to the                            patient.                           - Resume regular diet.                           - Continue present medications.                           - Repeat upper endoscopy in 3 years for screening                            purposes. Procedure Code(s):        --- Professional ---                            [phone], Esophagogastroduodenoscopy, flexible,                            transoral; diagnostic, including collection of                            specimen(s) by brushing or washing, when performed                            (separate procedure) Diagnosis Code(s):        --- Professional ---                           K76.6, Portal hypertension                           K31.89, Other diseases of stomach and duodenum                           K74.60, Unspecified cirrhosis of liver CPT copyright 2019 American Medical Association. All rights reserved. The codes documented in this report are preliminary and upon coder review may  be revised to meet current compliance requirements. [name], MD  01/11/2021 9:26:18 AM This report has been signed electronically. Number of Addenda: 0

## 2021-01-11 NOTE — Discharge Instructions (Signed)

## 2021-01-11 NOTE — Anesthesia Postprocedure Evaluation (Signed)
Anesthesia Post Note  Patient: Blake Goya  Procedure(s) Performed: COLONOSCOPY WITH PROPOFOL ESOPHAGOGASTRODUODENOSCOPY (EGD) WITH PROPOFOL POLYPECTOMY     Patient location during evaluation: Endoscopy Anesthesia Type: MAC Level of consciousness: awake and alert Pain management: pain level controlled Vital Signs Assessment: post-procedure vital signs reviewed and stable Respiratory status: spontaneous breathing, nonlabored ventilation, respiratory function stable and patient connected to nasal cannula oxygen Cardiovascular status: blood pressure returned to baseline and stable Postop Assessment: no apparent nausea or vomiting Anesthetic complications: no   No notable events documented.  Last Vitals:  Vitals:   01/11/21 0945 01/11/21 0950  BP: 130/76 134/78  Pulse: 85 86  Resp: 15 20  Temp:    SpO2: 99% 96%    Last Pain:  Vitals:   01/11/21 0950  TempSrc:   PainSc: 0-No pain                 Barnet Glasgow

## 2021-01-12 ENCOUNTER — Encounter (HOSPITAL_COMMUNITY): Payer: Self-pay | Admitting: Gastroenterology

## 2021-01-13 LAB — SURGICAL PATHOLOGY

## 2021-01-17 ENCOUNTER — Ambulatory Visit
Admission: RE | Admit: 2021-01-17 | Discharge: 2021-01-17 | Disposition: A | Discharge status: Home / Self Care | Payer: Medicare Other | Source: Ambulatory Visit | Attending: Gastroenterology | Admitting: Gastroenterology

## 2021-01-17 DIAGNOSIS — K8020 Calculus of gallbladder without cholecystitis without obstruction: Secondary | ICD-10-CM | POA: Diagnosis not present

## 2021-01-17 DIAGNOSIS — K7031 Alcoholic cirrhosis of liver with ascites: Secondary | ICD-10-CM

## 2021-01-17 DIAGNOSIS — K7460 Unspecified cirrhosis of liver: Secondary | ICD-10-CM | POA: Diagnosis not present

## 2021-01-18 ENCOUNTER — Telehealth: Payer: Self-pay | Admitting: Internal Medicine

## 2021-01-18 NOTE — Telephone Encounter (Signed)
LVM for pt to rtn my call at [phone] to schedule AWV with [facility]. Please schedule this appt if pt calls the office or comes in.

## 2021-03-11 DIAGNOSIS — H524 Presbyopia: Secondary | ICD-10-CM | POA: Diagnosis not present

## 2021-03-11 DIAGNOSIS — H35033 Hypertensive retinopathy, bilateral: Secondary | ICD-10-CM | POA: Diagnosis not present

## 2021-03-11 DIAGNOSIS — H40013 Open angle with borderline findings, low risk, bilateral: Secondary | ICD-10-CM | POA: Diagnosis not present

## 2021-03-11 DIAGNOSIS — H2513 Age-related nuclear cataract, bilateral: Secondary | ICD-10-CM | POA: Diagnosis not present

## 2021-03-11 DIAGNOSIS — I10 Essential (primary) hypertension: Secondary | ICD-10-CM | POA: Diagnosis not present

## 2021-03-16 ENCOUNTER — Ambulatory Visit
Admission: RE | Admit: 2021-03-16 | Discharge: 2021-03-16 | Disposition: A | Discharge status: Home / Self Care | Payer: Medicare Other | Source: Ambulatory Visit | Attending: Internal Medicine | Admitting: Internal Medicine

## 2021-03-16 ENCOUNTER — Other Ambulatory Visit: Payer: Self-pay

## 2021-03-16 DIAGNOSIS — Z1231 Encounter for screening mammogram for malignant neoplasm of breast: Secondary | ICD-10-CM

## 2021-03-17 DIAGNOSIS — E881 Lipodystrophy, not elsewhere classified: Secondary | ICD-10-CM | POA: Diagnosis not present

## 2021-04-29 ENCOUNTER — Other Ambulatory Visit: Payer: Self-pay | Admitting: Internal Medicine

## 2021-05-11 ENCOUNTER — Telehealth: Payer: Self-pay | Admitting: Internal Medicine

## 2021-05-11 MED ORDER — TRIAMCINOLONE ACETONIDE 0.1 % EX CREA: 1.0000 "application " | TOPICAL_CREAM | Freq: Two times a day (BID) | CUTANEOUS | 1 refills | Status: AC

## 2021-05-11 NOTE — Telephone Encounter (Signed)
Patient calling in  Patient says she is frequently using the bathroom & wants to know if provider can send her something to pharmacy to help  Also says she is having some itching all over & was previously prescribed triamcinolone cream (KENALOG) 0.1 % & wants provider to send over to pharmacy as well  Pharmacy  [id number] [address], [place]  Phone:  [phone] Fax:  [phone]   Patient has upcoming OV 12.19.22

## 2021-05-11 NOTE — Telephone Encounter (Signed)
Called patient back to advise her appt was needed.. patient declined to schedule OV

## 2021-05-11 NOTE — Telephone Encounter (Signed)
Ok this time - I sent rx to [facility]

## 2021-05-12 NOTE — Telephone Encounter (Signed)
Patient notified via voicemail.

## 2021-05-18 ENCOUNTER — Encounter: Payer: Self-pay | Admitting: Internal Medicine

## 2021-05-18 ENCOUNTER — Other Ambulatory Visit: Payer: Self-pay

## 2021-05-18 ENCOUNTER — Ambulatory Visit (INDEPENDENT_AMBULATORY_CARE_PROVIDER_SITE_OTHER): Payer: Medicare Other | Admitting: Internal Medicine

## 2021-05-18 VITALS — BP 128/80 | HR 97 | Resp 18 | Ht 65.0 in | Wt 158.4 lb

## 2021-05-18 DIAGNOSIS — E538 Deficiency of other specified B group vitamins: Secondary | ICD-10-CM

## 2021-05-18 DIAGNOSIS — R350 Frequency of micturition: Secondary | ICD-10-CM | POA: Diagnosis not present

## 2021-05-18 DIAGNOSIS — I10 Essential (primary) hypertension: Secondary | ICD-10-CM | POA: Diagnosis not present

## 2021-05-18 DIAGNOSIS — E7800 Pure hypercholesterolemia, unspecified: Secondary | ICD-10-CM

## 2021-05-18 DIAGNOSIS — D539 Nutritional anemia, unspecified: Secondary | ICD-10-CM | POA: Diagnosis not present

## 2021-05-18 DIAGNOSIS — Z23 Encounter for immunization: Secondary | ICD-10-CM

## 2021-05-18 DIAGNOSIS — L299 Pruritus, unspecified: Secondary | ICD-10-CM

## 2021-05-18 DIAGNOSIS — F418 Other specified anxiety disorders: Secondary | ICD-10-CM | POA: Diagnosis not present

## 2021-05-18 DIAGNOSIS — E559 Vitamin D deficiency, unspecified: Secondary | ICD-10-CM

## 2021-05-18 LAB — CBC WITH DIFFERENTIAL/PLATELET
Basophils Absolute: 0 10*3/uL (ref 0.0–0.1)
Basophils Relative: 0.3 % (ref 0.0–3.0)
Eosinophils Absolute: 0.6 10*3/uL (ref 0.0–0.7)
Eosinophils Relative: 9.3 % — ABNORMAL HIGH (ref 0.0–5.0)
HCT: 32.6 % — ABNORMAL LOW (ref 36.0–46.0)
Hemoglobin: 11 g/dL — ABNORMAL LOW (ref 12.0–15.0)
Lymphocytes Relative: 39 % (ref 12.0–46.0)
Lymphs Abs: 2.4 10*3/uL (ref 0.7–4.0)
MCHC: 33.8 g/dL (ref 30.0–36.0)
MCV: 101.5 fl — ABNORMAL HIGH (ref 78.0–100.0)
Monocytes Absolute: 0.5 10*3/uL (ref 0.1–1.0)
Monocytes Relative: 9 % (ref 3.0–12.0)
Neutro Abs: 2.6 10*3/uL (ref 1.4–7.7)
Neutrophils Relative %: 42.4 % — ABNORMAL LOW (ref 43.0–77.0)
Platelets: 174 10*3/uL (ref 150.0–400.0)
RBC: 3.21 Mil/uL — ABNORMAL LOW (ref 3.87–5.11)
RDW: 12.9 % (ref 11.5–15.5)
WBC: 6 10*3/uL (ref 4.0–10.5)

## 2021-05-18 LAB — HEPATIC FUNCTION PANEL
ALT: 12 U/L (ref 0–35)
AST: 20 U/L (ref 0–37)
Albumin: 4 g/dL (ref 3.5–5.2)
Alkaline Phosphatase: 83 U/L (ref 39–117)
Bilirubin, Direct: 0.2 mg/dL (ref 0.0–0.3)
Total Bilirubin: 0.8 mg/dL (ref 0.2–1.2)
Total Protein: 7.2 g/dL (ref 6.0–8.3)

## 2021-05-18 LAB — URINALYSIS, ROUTINE W REFLEX MICROSCOPIC
Bilirubin Urine: NEGATIVE
Hgb urine dipstick: NEGATIVE
Ketones, ur: NEGATIVE
Leukocytes,Ua: NEGATIVE
Nitrite: NEGATIVE
RBC / HPF: NONE SEEN (ref 0–?)
Specific Gravity, Urine: <=1.005 — AB (ref 1.000–1.030)
Total Protein, Urine: NEGATIVE
Urine Glucose: NEGATIVE
Urobilinogen, UA: 0.2 (ref 0.0–1.0)
WBC, UA: NONE SEEN (ref 0–?)
pH: 6.5 (ref 5.0–8.0)

## 2021-05-18 LAB — BASIC METABOLIC PANEL
BUN: 13 mg/dL (ref 6–23)
CO2: 29 mEq/L (ref 19–32)
Calcium: 9.9 mg/dL (ref 8.4–10.5)
Chloride: 104 mEq/L (ref 96–112)
Creatinine, Ser: 0.71 mg/dL (ref 0.40–1.20)
GFR: 93.58 mL/min (ref >60.00)
Glucose, Bld: 81 mg/dL (ref 70–99)
Potassium: 3.6 mEq/L (ref 3.5–5.1)
Sodium: 137 mEq/L (ref 135–145)

## 2021-05-18 LAB — VITAMIN D 25 HYDROXY (VIT D DEFICIENCY, FRACTURES): VITD: 17.68 ng/mL — ABNORMAL LOW (ref 30.00–100.00)

## 2021-05-18 LAB — LIPID PANEL
Cholesterol: 184 mg/dL (ref 0–200)
HDL: 66.5 mg/dL (ref >39.00)
LDL Cholesterol: 96 mg/dL (ref 0–99)
NonHDL: 117.31
Total CHOL/HDL Ratio: 3
Triglycerides: 107 mg/dL (ref 0.0–149.0)
VLDL: 21.4 mg/dL (ref 0.0–40.0)

## 2021-05-18 LAB — VITAMIN B12: Vitamin B-12: 456 pg/mL (ref 211–911)

## 2021-05-18 LAB — TSH: TSH: 2.05 u[IU]/mL (ref 0.35–5.50)

## 2021-05-18 MED ORDER — HYDROXYZINE HCL 10 MG PO TABS: 10.0000 mg | ORAL_TABLET | Freq: Three times a day (TID) | ORAL | 5 refills | Status: DC | PRN

## 2021-05-18 NOTE — Patient Instructions (Signed)
Please take all new medication as prescribed - the hydroxyzine  You had the Prevnar 20 pneumonia shot today  Please continue all other medications as before, and refills have been done if requested.  Please have the pharmacy call with any other refills you may need.  Please continue your efforts at being more active, low cholesterol diet, and weight control.  Please keep your appointments with your specialists as you may have planned  Please go to the LAB at the blood drawing area for the tests to be done  You will be contacted by phone if any changes need to be made immediately.  Otherwise, you will receive a letter about your results with an explanation, but please check with MyChart first.  Please remember to sign up for MyChart if you have not done so, as this will be important to you in the future with finding out test results, communicating by private email, and scheduling acute appointments online when needed.  Please make an Appointment to return in 6 months, or sooner if needed

## 2021-05-18 NOTE — Progress Notes (Signed)
Patient ID: Lori Oconnell, female   DOB: 1961-01-08, 60 y.o.   MRN: [id number]        Chief Complaint: follow up HTN, HLD and anemia, anxiety, itching, and urinary frequency       HPI:  Lori Oconnell is a 60 y.o. female here with c/o 2-3 days onset urinary frequency with nocturia and mild dysuria, feels she might have UTI, Denies urinary symptoms such as urgency, flank pain, hematuria or n/v, fever, chills.  Also has diffuse itching without rash or liver disease onset in the past 2 wks for unclear reason, mild to mod, not better with moisturizer lotion, nothing else makes better or worse.  Denies worsening depressive symptoms, suicidal ideation, or panic; has ongoing anxiety, with some increased recently due to increased stressors.  Pt denies chest pain, increased sob or doe, wheezing, orthopnea, PND, increased LE swelling, palpitations, dizziness or syncope.   Pt denies polydipsia, polyuria,     Due for prevnar 20 Wt Readings from Last 3 Encounters:  05/18/21 158 lb 6.4 oz (71.8 kg)  01/11/21 162 lb (73.5 kg)  11/22/20 179 lb (81.2 kg)   BP Readings from Last 3 Encounters:  05/18/21 128/80  01/11/21 134/78  11/22/20 122/80         Past Medical History:  Diagnosis Date   Anxiety    DISC DISEASE, CERVICAL 07/13/2009   Edema 03/22/2011   HEMATOCHEZIA 11/30/2009   HYPERLIPIDEMIA 07/18/2009   HYPERTENSION 07/12/2009   Irregular menses 03/22/2011   NECK PAIN 07/12/2009   OVERACTIVE BLADDER 07/12/2009   RENAL INSUFFICIENCY 07/12/2009   SINUSITIS- ACUTE-NOS 07/12/2009   Sleep apnea    prescribed cpap but has not gotten   TRANSAMINASES, SERUM, ELEVATED 07/12/2009   Past Surgical History:  Procedure Laterality Date   COLONOSCOPY WITH PROPOFOL N/A 01/11/2021   Procedure: COLONOSCOPY WITH PROPOFOL;  Surgeon: [name], MD;  Location: [facility];  Service: Gastroenterology;  Laterality: N/A;   ESOPHAGOGASTRODUODENOSCOPY (EGD) WITH PROPOFOL N/A 01/11/2021   Procedure: ESOPHAGOGASTRODUODENOSCOPY (EGD) WITH  PROPOFOL;  Surgeon: [name], MD;  Location: [facility];  Service: Gastroenterology;  Laterality: N/A;   MASS EXCISION N/A 08/27/2012   Procedure: EXCISION MASS UPPER BACK;  Surgeon: Joyice Faster. Cornett, MD;  Location: [facility];  Service: General;  Laterality: N/A;   POLYPECTOMY  01/11/2021   Procedure: POLYPECTOMY;  Surgeon: [name], MD;  Location: [facility];  Service: Gastroenterology;;   s/p ganglion cyst Right [place]    reports that she has been smoking cigarettes. She has a 1.25 pack-year smoking history. She has quit using smokeless tobacco.  Her smokeless tobacco use included chew. She reports current alcohol use of about 3.0 standard drinks per week. She reports that she does not use drugs. family history includes Diabetes in her maternal aunt; Hypertension in her mother. No Known Allergies Current Outpatient Medications on File Prior to Visit  Medication Sig Dispense Refill   Carboxymethylcellul-Glycerin (CLEAR EYES FOR DRY EYES) 1-0.25 % SOLN Place 1 drop into both eyes daily as needed (dry eyes).     furosemide (LASIX) 40 MG tablet Take 40 mg by mouth daily.     lactulose (CHRONULAC) 10 GM/15ML solution Take 15 mLs (10 g total) by mouth 2 (two) times daily. 946 mL 0   polyethylene glycol-electrolytes (NULYTELY) 420 g solution See admin instructions.     spironolactone (ALDACTONE) 100 MG tablet Take 100 mg by mouth daily.     triamcinolone cream (KENALOG) 0.1 % Apply  1 application topically 2 (two) times daily. 30 g 1   Vitamin D, Ergocalciferol, (DRISDOL) 1.25 MG (50000 UT) CAPS capsule Take 1 capsule (50,000 Units total) by mouth every 7 (seven) days. (Patient not taking: Reported on 01/06/2021) 12 capsule 0   No current facility-administered medications on file prior to visit.        ROS:  All others reviewed and negative.  Objective        PE:  BP 128/80   Pulse 97   Resp 18   Ht 5\' 5"  (1.651 m)   Wt 158 lb 6.4 oz (71.8 kg)   LMP 03/19/2011    SpO2 100%   BMI 26.36 kg/m                 Constitutional: Pt appears in NAD               HENT: Head: NCAT.                Right Ear: External ear normal.                 Left Ear: External ear normal.                Eyes: . Pupils are equal, round, and reactive to light. Conjunctivae and EOM are normal               Nose: without d/c or deformity               Neck: Neck supple. Gross normal ROM               Cardiovascular: Normal rate and regular rhythm.                 Pulmonary/Chest: Effort normal and breath sounds without rales or wheezing.                Abd:  Soft, NT, ND, + BS, no organomegaly               Neurological: Pt is alert. At baseline orientation, motor grossly intact               Skin: Skin is warm. No rashes, no other new lesions, LE edema - none               Psychiatric: Pt behavior is normal without agitation   Micro: none  Cardiac tracings I have personally interpreted today:  none  Pertinent Radiological findings (summarize): none   Lab Results  Component Value Date   WBC 6.0 05/18/2021   HGB 11.0 (L) 05/18/2021   HCT 32.6 (L) 05/18/2021   PLT 174.0 05/18/2021   GLUCOSE 81 05/18/2021   CHOL 184 05/18/2021   TRIG 107.0 05/18/2021   HDL 66.50 05/18/2021   LDLDIRECT 119.0 11/04/2015   LDLCALC 96 05/18/2021   ALT 12 05/18/2021   AST 20 05/18/2021   NA 137 05/18/2021   K 3.6 05/18/2021   CL 104 05/18/2021   CREATININE 0.71 05/18/2021   BUN 13 05/18/2021   CO2 29 05/18/2021   TSH 2.05 05/18/2021   INR 1.9 (H) 10/27/2020   Assessment/Plan:  Lori Oconnell is a 60 y.o. Black or African American [2] female with  has a past medical history of Anxiety, DISC DISEASE, CERVICAL (07/13/2009), Edema (03/22/2011), HEMATOCHEZIA (11/30/2009), HYPERLIPIDEMIA (07/18/2009), HYPERTENSION (07/12/2009), Irregular menses (03/22/2011), NECK PAIN (07/12/2009), OVERACTIVE BLADDER (07/12/2009), RENAL INSUFFICIENCY (07/12/2009), SINUSITIS- ACUTE-NOS (07/12/2009), Sleep apnea, and  TRANSAMINASES, SERUM, ELEVATED (  07/12/2009).  Hyperlipidemia Lab Results  Component Value Date   LDLCALC 96 05/18/2021   Stable, pt to continue current low chol diet, declines statin   Essential hypertension BP Readings from Last 3 Encounters:  05/18/21 128/80  01/11/21 134/78  11/22/20 122/80   Stable, pt to continue medical treatment lasix, aldactone   Macrocytic anemia Lab Results  Component Value Date   WBC 6.0 05/18/2021   HGB 11.0 (L) 05/18/2021   HCT 32.6 (L) 05/18/2021   MCV 101.5 (H) 05/18/2021   PLT 174.0 05/18/2021    Stable, no overt bleeding,  Lab Results  Component Value Date   VITAMINB12 456 05/18/2021  cont same tx  Urinary frequency Cant r/o uti - for urine studies, consider antibx pending results  Itching Ok for atarax prn,  to f/u any worsening symptoms or concerns  Depression with anxiety Ok for add atarax prn,  to f/u any worsening symptoms or concerns  Followup: Return in about 6 months (around 11/16/2021).  Cathlean Cower, MD 05/22/2021 7:04 PM [facility]

## 2021-05-19 ENCOUNTER — Encounter: Payer: Self-pay | Admitting: Internal Medicine

## 2021-05-19 LAB — URINE CULTURE: Result:: NO GROWTH

## 2021-05-22 ENCOUNTER — Encounter: Payer: Self-pay | Admitting: Internal Medicine

## 2021-05-22 DIAGNOSIS — L299 Pruritus, unspecified: Secondary | ICD-10-CM | POA: Insufficient documentation

## 2021-05-22 NOTE — Assessment & Plan Note (Signed)
BP Readings from Last 3 Encounters:  05/18/21 128/80  01/11/21 134/78  11/22/20 122/80   Stable, pt to continue medical treatment lasix, aldactone

## 2021-05-22 NOTE — Assessment & Plan Note (Signed)
Lab Results  Component Value Date   WBC 6.0 05/18/2021   HGB 11.0 (L) 05/18/2021   HCT 32.6 (L) 05/18/2021   MCV 101.5 (H) 05/18/2021   PLT 174.0 05/18/2021    Stable, no overt bleeding,  Lab Results  Component Value Date   VITAMINB12 456 05/18/2021  cont same tx

## 2021-05-22 NOTE — Assessment & Plan Note (Signed)
Lab Results  Component Value Date   LDLCALC 96 05/18/2021   Stable, pt to continue current low chol diet, declines statin

## 2021-05-22 NOTE — Assessment & Plan Note (Signed)
[place] for add atarax prn,  to f/u any worsening symptoms or concerns

## 2021-05-22 NOTE — Assessment & Plan Note (Signed)
[place] for atarax prn,  to f/u any worsening symptoms or concerns

## 2021-05-22 NOTE — Assessment & Plan Note (Signed)
Cant r/o uti - for urine studies, consider antibx pending results

## 2021-05-30 ENCOUNTER — Ambulatory Visit: Payer: Medicare Other | Admitting: Internal Medicine

## 2021-06-10 ENCOUNTER — Other Ambulatory Visit: Payer: Self-pay | Admitting: Internal Medicine

## 2021-06-10 NOTE — Telephone Encounter (Signed)
Please refill as per office routine med refill policy (all routine meds to be refilled for 3 mo or monthly (per pt preference) up to one year from last visit, then month to month grace period for 3 mo, then further med refills will have to be denied) ? ?

## 2021-07-18 DIAGNOSIS — L299 Pruritus, unspecified: Secondary | ICD-10-CM | POA: Diagnosis not present

## 2021-08-24 ENCOUNTER — Encounter: Payer: Self-pay | Admitting: Internal Medicine

## 2021-08-24 ENCOUNTER — Ambulatory Visit (INDEPENDENT_AMBULATORY_CARE_PROVIDER_SITE_OTHER): Payer: Medicare Other

## 2021-08-24 ENCOUNTER — Ambulatory Visit (INDEPENDENT_AMBULATORY_CARE_PROVIDER_SITE_OTHER): Payer: Medicare Other | Admitting: Internal Medicine

## 2021-08-24 ENCOUNTER — Other Ambulatory Visit: Payer: Self-pay | Admitting: Internal Medicine

## 2021-08-24 ENCOUNTER — Other Ambulatory Visit: Payer: Self-pay

## 2021-08-24 VITALS — BP 138/80 | HR 87 | Temp 98.6°F | Ht 65.0 in | Wt 166.0 lb

## 2021-08-24 DIAGNOSIS — M5442 Lumbago with sciatica, left side: Secondary | ICD-10-CM | POA: Diagnosis not present

## 2021-08-24 DIAGNOSIS — M79644 Pain in right finger(s): Secondary | ICD-10-CM

## 2021-08-24 DIAGNOSIS — M542 Cervicalgia: Secondary | ICD-10-CM

## 2021-08-24 DIAGNOSIS — M79645 Pain in left finger(s): Secondary | ICD-10-CM | POA: Diagnosis not present

## 2021-08-24 DIAGNOSIS — M545 Low back pain, unspecified: Secondary | ICD-10-CM | POA: Diagnosis not present

## 2021-08-24 DIAGNOSIS — M2578 Osteophyte, vertebrae: Secondary | ICD-10-CM | POA: Diagnosis not present

## 2021-08-24 MED ORDER — METHOCARBAMOL 500 MG PO TABS: 500.0000 mg | ORAL_TABLET | Freq: Three times a day (TID) | ORAL | 0 refills | Status: DC | PRN

## 2021-08-24 MED ORDER — PREDNISONE 20 MG PO TABS: 40.0000 mg | ORAL_TABLET | Freq: Every day | ORAL | 0 refills | Status: AC

## 2021-08-24 NOTE — Patient Instructions (Addendum)
? ? ? ?  Have xrays downstairs ? ? ?Apply heat.  You can try topical arthritis/muscle medications.  You can apply ice.   ? ? ?Medications changes include :    ?prednisone 40 mg daily with breakfast ?Methocarbamol 500 mg three times a day for muscle spasms ? ? ? ?Your prescription(s) have been sent to your pharmacy.  ? ? ?A referral was ordered for sports medicine.     Someone from that office will call you to schedule an appointment.  ? ? ?Return if symptoms worsen or fail to improve. ? ?
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
## 2021-08-31 ENCOUNTER — Other Ambulatory Visit: Payer: Self-pay

## 2021-08-31 ENCOUNTER — Ambulatory Visit (INDEPENDENT_AMBULATORY_CARE_PROVIDER_SITE_OTHER): Payer: Medicare Other | Admitting: Sports Medicine

## 2021-08-31 VITALS — BP 138/80 | HR 90 | Ht 65.0 in | Wt 166.0 lb

## 2021-08-31 DIAGNOSIS — M9903 Segmental and somatic dysfunction of lumbar region: Secondary | ICD-10-CM

## 2021-08-31 DIAGNOSIS — M542 Cervicalgia: Secondary | ICD-10-CM | POA: Diagnosis not present

## 2021-08-31 DIAGNOSIS — M9908 Segmental and somatic dysfunction of rib cage: Secondary | ICD-10-CM | POA: Diagnosis not present

## 2021-08-31 DIAGNOSIS — M545 Low back pain, unspecified: Secondary | ICD-10-CM | POA: Diagnosis not present

## 2021-08-31 DIAGNOSIS — M9901 Segmental and somatic dysfunction of cervical region: Secondary | ICD-10-CM | POA: Diagnosis not present

## 2021-08-31 DIAGNOSIS — M9902 Segmental and somatic dysfunction of thoracic region: Secondary | ICD-10-CM | POA: Diagnosis not present

## 2021-08-31 DIAGNOSIS — M9905 Segmental and somatic dysfunction of pelvic region: Secondary | ICD-10-CM | POA: Diagnosis not present

## 2021-08-31 MED ORDER — MELOXICAM 15 MG PO TABS: 15.0000 mg | ORAL_TABLET | Freq: Every day | ORAL | 0 refills | Status: DC

## 2021-08-31 NOTE — Patient Instructions (Addendum)
Good to see you  ?Neck and low back HEP ?Continue robaxin  ?- Start meloxicam 15 mg daily x2 weeks.  If still having pain after 2 weeks, complete 3rd-week of meloxicam. May use remaining meloxicam as needed once daily for pain control.  Do not to use additional NSAIDs while taking meloxicam.  May use Tylenol [phone] mg 2 to 3 times a day for breakthrough pain. ?2 week follow up  ?
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
## 2021-09-14 ENCOUNTER — Ambulatory Visit: Payer: Medicare Other | Admitting: Sports Medicine

## 2021-09-14 VITALS — BP 140/82 | HR 114 | Ht 65.0 in | Wt 171.0 lb

## 2021-09-14 DIAGNOSIS — M9903 Segmental and somatic dysfunction of lumbar region: Secondary | ICD-10-CM | POA: Diagnosis not present

## 2021-09-14 DIAGNOSIS — M542 Cervicalgia: Secondary | ICD-10-CM | POA: Diagnosis not present

## 2021-09-14 DIAGNOSIS — G8929 Other chronic pain: Secondary | ICD-10-CM

## 2021-09-14 DIAGNOSIS — M25511 Pain in right shoulder: Secondary | ICD-10-CM | POA: Diagnosis not present

## 2021-09-14 DIAGNOSIS — M9902 Segmental and somatic dysfunction of thoracic region: Secondary | ICD-10-CM | POA: Diagnosis not present

## 2021-09-14 DIAGNOSIS — M25512 Pain in left shoulder: Secondary | ICD-10-CM

## 2021-09-14 DIAGNOSIS — M9901 Segmental and somatic dysfunction of cervical region: Secondary | ICD-10-CM

## 2021-09-14 DIAGNOSIS — M9908 Segmental and somatic dysfunction of rib cage: Secondary | ICD-10-CM | POA: Diagnosis not present

## 2021-09-14 DIAGNOSIS — M9905 Segmental and somatic dysfunction of pelvic region: Secondary | ICD-10-CM

## 2021-09-14 NOTE — Patient Instructions (Addendum)
Good to see you  ?HEP for shoulders  ?4 week follow up  ? ?

## 2021-09-30 ENCOUNTER — Other Ambulatory Visit: Payer: Self-pay | Admitting: Sports Medicine

## 2021-11-17 ENCOUNTER — Ambulatory Visit (INDEPENDENT_AMBULATORY_CARE_PROVIDER_SITE_OTHER): Payer: Medicare Other | Admitting: Internal Medicine

## 2021-11-17 ENCOUNTER — Encounter: Payer: Self-pay | Admitting: Internal Medicine

## 2021-11-17 VITALS — BP 130/78 | HR 70 | Temp 98.2°F | Ht 65.0 in | Wt 175.0 lb

## 2021-11-17 DIAGNOSIS — Z01419 Encounter for gynecological examination (general) (routine) without abnormal findings: Secondary | ICD-10-CM | POA: Diagnosis not present

## 2021-11-17 DIAGNOSIS — I10 Essential (primary) hypertension: Secondary | ICD-10-CM | POA: Diagnosis not present

## 2021-11-17 DIAGNOSIS — E559 Vitamin D deficiency, unspecified: Secondary | ICD-10-CM | POA: Diagnosis not present

## 2021-11-17 DIAGNOSIS — E7800 Pure hypercholesterolemia, unspecified: Secondary | ICD-10-CM | POA: Diagnosis not present

## 2021-11-17 DIAGNOSIS — R739 Hyperglycemia, unspecified: Secondary | ICD-10-CM | POA: Diagnosis not present

## 2021-11-17 DIAGNOSIS — E538 Deficiency of other specified B group vitamins: Secondary | ICD-10-CM | POA: Diagnosis not present

## 2021-11-17 DIAGNOSIS — Z0001 Encounter for general adult medical examination with abnormal findings: Secondary | ICD-10-CM | POA: Diagnosis not present

## 2021-11-17 DIAGNOSIS — F17200 Nicotine dependence, unspecified, uncomplicated: Secondary | ICD-10-CM | POA: Diagnosis not present

## 2021-11-17 DIAGNOSIS — F418 Other specified anxiety disorders: Secondary | ICD-10-CM

## 2021-11-17 LAB — HEPATIC FUNCTION PANEL
ALT: 12 U/L (ref 0–35)
AST: 16 U/L (ref 0–37)
Albumin: 4.1 g/dL (ref 3.5–5.2)
Alkaline Phosphatase: 121 U/L — ABNORMAL HIGH (ref 39–117)
Bilirubin, Direct: 0.1 mg/dL (ref 0.0–0.3)
Total Bilirubin: 0.4 mg/dL (ref 0.2–1.2)
Total Protein: 7.4 g/dL (ref 6.0–8.3)

## 2021-11-17 LAB — CBC WITH DIFFERENTIAL/PLATELET
Basophils Absolute: 0 10*3/uL (ref 0.0–0.1)
Basophils Relative: 0.5 % (ref 0.0–3.0)
Eosinophils Absolute: 0.7 10*3/uL (ref 0.0–0.7)
Eosinophils Relative: 9.6 % — ABNORMAL HIGH (ref 0.0–5.0)
HCT: 35.5 % — ABNORMAL LOW (ref 36.0–46.0)
Hemoglobin: 12.1 g/dL (ref 12.0–15.0)
Lymphocytes Relative: 38.3 % (ref 12.0–46.0)
Lymphs Abs: 2.8 10*3/uL (ref 0.7–4.0)
MCHC: 34.1 g/dL (ref 30.0–36.0)
MCV: 96.6 fl (ref 78.0–100.0)
Monocytes Absolute: 0.7 10*3/uL (ref 0.1–1.0)
Monocytes Relative: 9.2 % (ref 3.0–12.0)
Neutro Abs: 3.1 10*3/uL (ref 1.4–7.7)
Neutrophils Relative %: 42.4 % — ABNORMAL LOW (ref 43.0–77.0)
Platelets: 202 10*3/uL (ref 150.0–400.0)
RBC: 3.67 Mil/uL — ABNORMAL LOW (ref 3.87–5.11)
RDW: 12.5 % (ref 11.5–15.5)
WBC: 7.4 10*3/uL (ref 4.0–10.5)

## 2021-11-17 LAB — BASIC METABOLIC PANEL
BUN: 13 mg/dL (ref 6–23)
CO2: 27 mEq/L (ref 19–32)
Calcium: 9.6 mg/dL (ref 8.4–10.5)
Chloride: 104 mEq/L (ref 96–112)
Creatinine, Ser: 0.8 mg/dL (ref 0.40–1.20)
GFR: 80.81 mL/min (ref >60.00)
Glucose, Bld: 70 mg/dL (ref 70–99)
Potassium: 3.9 mEq/L (ref 3.5–5.1)
Sodium: 139 mEq/L (ref 135–145)

## 2021-11-17 LAB — URINALYSIS, ROUTINE W REFLEX MICROSCOPIC
Bilirubin Urine: NEGATIVE
Hgb urine dipstick: NEGATIVE
Ketones, ur: NEGATIVE
Nitrite: NEGATIVE
RBC / HPF: NONE SEEN (ref 0–?)
Specific Gravity, Urine: 1.01 (ref 1.000–1.030)
Total Protein, Urine: NEGATIVE
Urine Glucose: NEGATIVE
Urobilinogen, UA: 0.2 (ref 0.0–1.0)
pH: 6 (ref 5.0–8.0)

## 2021-11-17 LAB — VITAMIN B12: Vitamin B-12: 644 pg/mL (ref 211–911)

## 2021-11-17 LAB — TSH: TSH: 1.18 u[IU]/mL (ref 0.35–5.50)

## 2021-11-17 LAB — LIPID PANEL
Cholesterol: 168 mg/dL (ref 0–200)
HDL: 58.1 mg/dL (ref >39.00)
LDL Cholesterol: 80 mg/dL (ref 0–99)
NonHDL: 109.55
Total CHOL/HDL Ratio: 3
Triglycerides: 150 mg/dL — ABNORMAL HIGH (ref 0.0–149.0)
VLDL: 30 mg/dL (ref 0.0–40.0)

## 2021-11-17 LAB — VITAMIN D 25 HYDROXY (VIT D DEFICIENCY, FRACTURES): VITD: 47.54 ng/mL (ref 30.00–100.00)

## 2021-11-17 LAB — HEMOGLOBIN A1C: Hgb A1c MFr Bld: 5.7 % (ref 4.6–6.5)

## 2021-11-17 NOTE — Patient Instructions (Addendum)
Please have your Shingles shot done at the pharmacy  You will be contacted regarding the referral for: GYN  Please continue all other medications as before, and refills have been done if requested.  Please have the pharmacy call with any other refills you may need.  Please continue your efforts at being more active, low cholesterol diet, and weight control.  You are otherwise up to date with prevention measures today.  Please keep your appointments with your specialists as you may have planned  Please go to the LAB at the blood drawing area for the tests to be done  You will be contacted by phone if any changes need to be made immediately.  Otherwise, you will receive a letter about your results with an explanation, but please check with MyChart first.  Please remember to sign up for MyChart if you have not done so, as this will be important to you in the future with finding out test results, communicating by private email, and scheduling acute appointments online when needed.  Please make an Appointment to return in 6 months, or sooner if needed, as we may need to monitor the sugar more closely

## 2021-11-17 NOTE — Progress Notes (Unsigned)
Patient ID: Lori Oconnell, female   DOB: December 03, 1960, 62 y.o.   MRN: [id number]         Chief Complaint:: wellness exam and low vit d, hyperglycemia,  smoking, hld, htn, depression       HPI:  Lori Oconnell is a 61 y.o. female here for wellness exam; due for shingrix #2 shot soon at the local pharmacy, due for GYN well woman exam, declines tdap and covid booster, o/w up to date                        Also not taking Vit d.  Pt denies chest pain, increased sob or doe, wheezing, orthopnea, PND, increased LE swelling, palpitations, dizziness or syncope.   Pt denies polydipsia, polyuria, or new focal neuro s/s.    Pt denies fever, wt loss, night sweats, loss of appetite, or other constitutional symptoms  Still smoking not ready to quit.  Denies worsening depressive symptoms, suicidal ideation, or panic.         Wt Readings from Last 3 Encounters:  11/17/21 175 lb (79.4 kg)  09/14/21 171 lb (77.6 kg)  08/31/21 166 lb (75.3 kg)   BP Readings from Last 3 Encounters:  11/17/21 130/78  09/14/21 140/82  08/31/21 138/80   Immunization History  Administered Date(s) Administered   Influenza Split 03/22/2011   Influenza Whole 07/12/2009   Influenza, Quadrivalent, Recombinant, Inj, Pf 05/05/2019   Influenza,inj,Quad PF,6+ Mos 07/02/2013, 05/26/2016, 05/09/2017, 05/15/2018   PFIZER(Purple Top)SARS-COV-2 Vaccination 09/11/2019, 10/10/2019   PNEUMOCOCCAL CONJUGATE-20 05/18/2021   Td 07/12/2009   Zoster Recombinat (Shingrix) 06/12/2019   Health Maintenance Due  Topic Date Due   Zoster Vaccines- Shingrix (2 of 2) 08/07/2019   PAP SMEAR-Modifier  06/08/2020      Past Medical History:  Diagnosis Date   Anxiety    DISC DISEASE, CERVICAL 07/13/2009   Edema 03/22/2011   HEMATOCHEZIA 11/30/2009   HYPERLIPIDEMIA 07/18/2009   HYPERTENSION 07/12/2009   Irregular menses 03/22/2011   NECK PAIN 07/12/2009   OVERACTIVE BLADDER 07/12/2009   RENAL INSUFFICIENCY 07/12/2009   SINUSITIS- ACUTE-NOS 07/12/2009   Sleep  apnea    prescribed cpap but has not gotten   TRANSAMINASES, SERUM, ELEVATED 07/12/2009   Past Surgical History:  Procedure Laterality Date   COLONOSCOPY WITH PROPOFOL N/A 01/11/2021   Procedure: COLONOSCOPY WITH PROPOFOL;  Surgeon: [name], MD;  Location: [facility];  Service: Gastroenterology;  Laterality: N/A;   ESOPHAGOGASTRODUODENOSCOPY (EGD) WITH PROPOFOL N/A 01/11/2021   Procedure: ESOPHAGOGASTRODUODENOSCOPY (EGD) WITH PROPOFOL;  Surgeon: [name], MD;  Location: [facility];  Service: Gastroenterology;  Laterality: N/A;   MASS EXCISION N/A 08/27/2012   Procedure: EXCISION MASS UPPER BACK;  Surgeon: Joyice Faster. Cornett, MD;  Location: [facility];  Service: General;  Laterality: N/A;   POLYPECTOMY  01/11/2021   Procedure: POLYPECTOMY;  Surgeon: [name], MD;  Location: [facility];  Service: Gastroenterology;;   s/p ganglion cyst Right [place]    reports that she has been smoking cigarettes. She has a 1.25 pack-year smoking history. She has quit using smokeless tobacco.  Her smokeless tobacco use included chew. She reports current alcohol use of about 3.0 standard drinks of alcohol per week. She reports that she does not use drugs. family history includes Diabetes in her maternal aunt; Hypertension in her mother. No Known Allergies Current Outpatient Medications on File Prior to Visit  Medication Sig Dispense Refill   Carboxymethylcellul-Glycerin (CLEAR EYES FOR  DRY EYES) 1-0.25 % SOLN Place 1 drop into both eyes daily as needed (dry eyes).     hydrOXYzine (ATARAX) 25 MG tablet Take 25 mg by mouth 3 (three) times daily as needed.     citalopram (CELEXA) 20 MG tablet Take 20 mg by mouth daily. (Patient not taking: Reported on 11/17/2021)     furosemide (LASIX) 40 MG tablet Take 40 mg by mouth daily. (Patient not taking: Reported on 11/17/2021)     hydrOXYzine (ATARAX) 10 MG tablet TAKE 1 TABLET BY MOUTH THREE TIMES A DAY AS NEEDED (Patient not taking: Reported on  11/17/2021) 270 tablet 3   lactulose (CHRONULAC) 10 GM/15ML solution Take 15 mLs (10 g total) by mouth 2 (two) times daily. (Patient not taking: Reported on 11/17/2021) 946 mL 0   meloxicam (MOBIC) 15 MG tablet Take 1 tablet (15 mg total) by mouth daily. (Patient not taking: Reported on 11/17/2021) 30 tablet 0   methocarbamol (ROBAXIN) 500 MG tablet Take 1 tablet (500 mg total) by mouth every 8 (eight) hours as needed for muscle spasms. (Patient not taking: Reported on 11/17/2021) 30 tablet 0   polyethylene glycol-electrolytes (NULYTELY) 420 g solution See admin instructions. (Patient not taking: Reported on 11/17/2021)     spironolactone (ALDACTONE) 100 MG tablet Take 100 mg by mouth daily. (Patient not taking: Reported on 11/17/2021)     triamcinolone cream (KENALOG) 0.1 % Apply 1 application topically 2 (two) times daily. (Patient not taking: Reported on 11/17/2021) 30 g 1   Vitamin D, Ergocalciferol, (DRISDOL) 1.25 MG (50000 UT) CAPS capsule Take 1 capsule (50,000 Units total) by mouth every 7 (seven) days. (Patient not taking: Reported on 11/17/2021) 12 capsule 0   No current facility-administered medications on file prior to visit.        ROS:  All others reviewed and negative.  Objective        PE:  BP 130/78 (BP Location: Right Arm, Patient Position: Sitting, Cuff Size: Large)   Pulse 70   Temp 98.2 F (36.8 C) (Oral)   Ht '5\' 5"'$  (1.651 m)   Wt 175 lb (79.4 kg)   LMP 03/19/2011   SpO2 99%   BMI 29.12 kg/m                 Constitutional: Pt appears in NAD               HENT: Head: NCAT.                Right Ear: External ear normal.                 Left Ear: External ear normal.                Eyes: . Pupils are equal, round, and reactive to light. Conjunctivae and EOM are normal               Nose: without d/c or deformity               Neck: Neck supple. Gross normal ROM               Cardiovascular: Normal rate and regular rhythm.                 Pulmonary/Chest: Effort normal and breath  sounds without rales or wheezing.                Abd:  Soft, NT, ND, + BS, no organomegaly  Neurological: Pt is alert. At baseline orientation, motor grossly intact               Skin: Skin is warm. No rashes, no other new lesions, LE edema - none               Psychiatric: Pt behavior is normal without agitation   Micro: none  Cardiac tracings I have personally interpreted today:  none  Pertinent Radiological findings (summarize): none   Lab Results  Component Value Date   WBC 7.4 11/17/2021   HGB 12.1 11/17/2021   HCT 35.5 (L) 11/17/2021   PLT 202.0 11/17/2021   GLUCOSE 70 11/17/2021   CHOL 168 11/17/2021   TRIG 150.0 (H) 11/17/2021   HDL 58.10 11/17/2021   LDLDIRECT 119.0 11/04/2015   LDLCALC 80 11/17/2021   ALT 12 11/17/2021   AST 16 11/17/2021   NA 139 11/17/2021   K 3.9 11/17/2021   CL 104 11/17/2021   CREATININE 0.80 11/17/2021   BUN 13 11/17/2021   CO2 27 11/17/2021   TSH 1.18 11/17/2021   INR 1.9 (H) 10/27/2020   HGBA1C 5.7 11/17/2021   Assessment/Plan:  Lori Oconnell is a 61 y.o. Black or African American [2] female with  has a past medical history of Anxiety, DISC DISEASE, CERVICAL (07/13/2009), Edema (03/22/2011), HEMATOCHEZIA (11/30/2009), HYPERLIPIDEMIA (07/18/2009), HYPERTENSION (07/12/2009), Irregular menses (03/22/2011), NECK PAIN (07/12/2009), OVERACTIVE BLADDER (07/12/2009), RENAL INSUFFICIENCY (07/12/2009), SINUSITIS- ACUTE-NOS (07/12/2009), Sleep apnea, and TRANSAMINASES, SERUM, ELEVATED (07/12/2009).  Vitamin D deficiency Last vitamin D Lab Results  Component Value Date   VD25OH 17.68 (L) 05/18/2021   Low, to start oral replacement   Encounter for well adult exam with abnormal findings Age and sex appropriate education and counseling updated with regular exercise and diet Referrals for preventative services - for GYN referral well woman exam Immunizations addressed - for shingrix shot #2, declines tdap and covid booster Smoking counseling  -  pt counseled to quit, pt not ready Evidence for depression or other mood disorder - stable depression anxiety - declines need for change in tx or counseling Most recent labs reviewed. I have personally reviewed and have noted: 1) the patient's medical and social history 2) The patient's current medications and supplements 3) The patient's height, weight, and BMI have been recorded in the chart   Smoker Pt counsled to quit, pt not ready  Hyperlipidemia Lab Results  Component Value Date   [place] 80 11/17/2021   Stable, pt to continue current low chol diet   Hyperglycemia Lab Results  Component Value Date   HGBA1C 5.7 11/17/2021   Stable, pt to continue current medical treatment  - diet, wt control   Essential hypertension BP Readings from Last 3 Encounters:  11/17/21 130/78  09/14/21 140/82  08/31/21 138/80   Stable, pt to continue medical treatment aldactone 100 qd   Depression with anxiety Stable overall, continue celexa 20 qd, declines need for counseling  Followup: Return in about 6 months (around 05/19/2022).  Cathlean Cower, MD 11/19/2021 5:34 PM [facility]

## 2021-11-17 NOTE — Assessment & Plan Note (Signed)
Last vitamin D Lab Results  Component Value Date   VD25OH 17.68 (L) 05/18/2021   Low, to start oral replacement

## 2021-11-19 ENCOUNTER — Encounter: Payer: Self-pay | Admitting: Internal Medicine

## 2021-11-19 NOTE — Assessment & Plan Note (Signed)
Pt counsled to quit, pt not ready °

## 2021-11-19 NOTE — Assessment & Plan Note (Signed)
BP Readings from Last 3 Encounters:  11/17/21 130/78  09/14/21 140/82  08/31/21 138/80   Stable, pt to continue medical treatment aldactone 100 qd

## 2021-11-19 NOTE — Assessment & Plan Note (Signed)
Age and sex appropriate education and counseling updated with regular exercise and diet Referrals for preventative services - for GYN referral well woman exam Immunizations addressed - for shingrix shot #2, declines tdap and covid booster Smoking counseling  - pt counseled to quit, pt not ready Evidence for depression or other mood disorder - stable depression anxiety - declines need for change in tx or counseling Most recent labs reviewed. I have personally reviewed and have noted: 1) the patient's medical and social history 2) The patient's current medications and supplements 3) The patient's height, weight, and BMI have been recorded in the chart

## 2021-11-19 NOTE — Assessment & Plan Note (Signed)
Lab Results  Component Value Date   LDLCALC 80 11/17/2021   Stable, pt to continue current low chol diet

## 2021-11-19 NOTE — Assessment & Plan Note (Signed)
Lab Results  Component Value Date   HGBA1C 5.7 11/17/2021   Stable, pt to continue current medical treatment  - diet, wt control

## 2021-11-19 NOTE — Assessment & Plan Note (Signed)
Stable overall, continue celexa 20 qd, declines need for counseling

## 2022-01-04 ENCOUNTER — Other Ambulatory Visit (HOSPITAL_COMMUNITY)
Admission: RE | Admit: 2022-01-04 | Discharge: 2022-01-04 | Disposition: A | Discharge status: Home / Self Care | Payer: Medicare Other | Source: Ambulatory Visit | Attending: Obstetrics and Gynecology | Admitting: Obstetrics and Gynecology

## 2022-01-04 ENCOUNTER — Ambulatory Visit (INDEPENDENT_AMBULATORY_CARE_PROVIDER_SITE_OTHER): Payer: Medicare Other | Admitting: Obstetrics and Gynecology

## 2022-01-04 ENCOUNTER — Encounter: Payer: Self-pay | Admitting: Obstetrics and Gynecology

## 2022-01-04 VITALS — BP 139/89 | HR 91 | Ht 65.0 in | Wt 178.7 lb

## 2022-01-04 DIAGNOSIS — Z01419 Encounter for gynecological examination (general) (routine) without abnormal findings: Secondary | ICD-10-CM | POA: Diagnosis present

## 2022-01-04 DIAGNOSIS — Z1151 Encounter for screening for human papillomavirus (HPV): Secondary | ICD-10-CM | POA: Diagnosis not present

## 2022-01-04 NOTE — Progress Notes (Signed)
Subjective:     Lori Oconnell is a 61 y.o. female postmenopausal with BMI 29 who is here for a comprehensive physical exam. The patient reports no problems. She denies any episodes of postmenopausal vaginal bleeding. She is not sexually active. She denies pelvic pain or abnormal discharge. She denies urinary incontinence and reports regular bowel movements. Patient is without any complaints. She is a current smoker  Past Medical History:  Diagnosis Date   Anxiety    DISC DISEASE, CERVICAL 07/13/2009   Edema 03/22/2011   HEMATOCHEZIA 11/30/2009   HYPERLIPIDEMIA 07/18/2009   HYPERTENSION 07/12/2009   Irregular menses 03/22/2011   NECK PAIN 07/12/2009   OVERACTIVE BLADDER 07/12/2009   RENAL INSUFFICIENCY 07/12/2009   SINUSITIS- ACUTE-NOS 07/12/2009   Sleep apnea    prescribed cpap but has not gotten   TRANSAMINASES, SERUM, ELEVATED 07/12/2009   Past Surgical History:  Procedure Laterality Date   COLONOSCOPY WITH PROPOFOL N/A 01/11/2021   Procedure: COLONOSCOPY WITH PROPOFOL;  Surgeon: [name], MD;  Location: [facility];  Service: Gastroenterology;  Laterality: N/A;   ESOPHAGOGASTRODUODENOSCOPY (EGD) WITH PROPOFOL N/A 01/11/2021   Procedure: ESOPHAGOGASTRODUODENOSCOPY (EGD) WITH PROPOFOL;  Surgeon: [name], MD;  Location: [facility];  Service: Gastroenterology;  Laterality: N/A;   MASS EXCISION N/A 08/27/2012   Procedure: EXCISION MASS UPPER BACK;  Surgeon: Joyice Faster. Cornett, MD;  Location: [facility];  Service: General;  Laterality: N/A;   POLYPECTOMY  01/11/2021   Procedure: POLYPECTOMY;  Surgeon: [name], MD;  Location: [facility];  Service: Gastroenterology;;   s/p ganglion cyst Right 1980   TUBAL LIGATION  1990   Family History  Problem Relation Age of Onset   Hypertension Mother    Kidney failure Mother    Diabetes Maternal Aunt    Colon cancer Neg Hx     Social History   Socioeconomic History   Marital status: Married    Spouse name: Not on file   Number of children: 2    Years of education: Not on file   Highest education level: Not on file  Occupational History   Occupation: CNA  Tobacco Use   Smoking status: Some Days    Packs/day: 0.25    Years: 5.00    Total pack years: 1.25    Types: Cigarettes   Smokeless tobacco: Former    Types: Chew   Tobacco comments:    smokes 2 to 3 cigarettes a day  Vaping Use   Vaping Use: Never used  Substance and Sexual Activity   Alcohol use: Not Currently    Alcohol/week: 3.0 standard drinks of alcohol    Types: 3 Standard drinks or equivalent per week   Drug use: No   Sexual activity: Not Currently    Partners: Male    Birth control/protection: None  Other Topics Concern   Not on file  Social History Narrative   Not on file   Social Determinants of Health   Financial Resource Strain: Low Risk  (05/28/2018)   Overall Financial Resource Strain (CARDIA)    Difficulty of Paying Living Expenses: Not hard at all  Food Insecurity: No Food Insecurity (05/28/2018)   Hunger Vital Sign    Worried About Running Out of Food in the Last Year: Never true    [place] in the Last Year: Never true  Transportation Needs: No Transportation Needs (05/28/2018)   PRAPARE - Hydrologist (Medical): No    Lack of Transportation (Non-Medical): No  Physical  Activity: Sufficiently Active (05/28/2018)   Exercise Vital Sign    Days of Exercise per Week: 7 days    Minutes of Exercise per Session: 70 min  Stress: No Stress Concern Present (05/28/2018)   [place]    Feeling of Stress : Not at all  Social Connections: [place] (05/28/2018)   Social Connection and Isolation Panel [NHANES]    Frequency of Communication with Friends and Family: More than three times a week    Frequency of Social Gatherings with Friends and Family: More than three times a week    Attends Religious Services: More than 4 times per year     Active Member of Clubs or Organizations: Yes    Attends Archivist Meetings: More than 4 times per year    Marital Status: Married  Human resources officer Violence: Not At Risk (05/28/2018)   Humiliation, Afraid, Rape, and Kick questionnaire    Fear of Current or Ex-Partner: No    Emotionally Abused: No    Physically Abused: No    Sexually Abused: No   Health Maintenance  Topic Date Due   TETANUS/TDAP  07/13/2019   Zoster Vaccines- Shingrix (2 of 2) 08/07/2019   COVID-19 Vaccine (3 - Pfizer series) 12/05/2019   PAP SMEAR-Modifier  06/08/2020   INFLUENZA VACCINE  01/10/2022   MAMMOGRAM  03/17/2023   COLONOSCOPY (Pts 45-78yr Insurance coverage will need to be confirmed)  01/12/2031   Hepatitis C Screening  Completed   HIV Screening  Completed   HPV VACCINES  Aged Out       Review of Systems Pertinent items noted in HPI and remainder of comprehensive ROS otherwise negative.   Objective:  Blood pressure (!) 165/110, pulse 100, height '5\' 5"'$  (1.651 m), weight 178 lb 11.2 oz (81.1 kg), last menstrual period 03/19/2011.   GENERAL: Well-developed, well-nourished female in no acute distress.  HEENT: Normocephalic, atraumatic. Sclerae anicteric.  NECK: Supple. Normal thyroid.  LUNGS: Clear to auscultation bilaterally.  HEART: Regular rate and rhythm. BREASTS: Symmetric in size. No palpable masses or lymphadenopathy, skin changes, or nipple drainage. ABDOMEN: Soft, nontender, nondistended. No organomegaly. PELVIC: Normal external female genitalia. Vagina is pink and rugated.  Normal discharge. Normal appearing cervix. Uterus is normal in size. No adnexal mass or tenderness. Chaperone present during the pelvic exam EXTREMITIES: No cyanosis, clubbing, or edema, 2+ distal pulses.     Assessment:    Healthy female exam.      Plan:    Pap smear collected  Screening mammogram ordered Patient will be contacted with abnormal results Patient had a colonoscopy last year Elevated  BP today. Patient advised to follow up with PCP. Smoking cessation discussed See After Visit Summary for Counseling Recommendations

## 2022-01-04 NOTE — Progress Notes (Signed)
Patient presents as a new patient for AEX. Denies any vaginal issues today.  Last MM: 03/16/21 Normal

## 2022-01-12 ENCOUNTER — Other Ambulatory Visit: Payer: Self-pay | Admitting: Obstetrics & Gynecology

## 2022-01-12 ENCOUNTER — Encounter: Payer: Self-pay | Admitting: Obstetrics & Gynecology

## 2022-01-12 DIAGNOSIS — A5901 Trichomonal vulvovaginitis: Secondary | ICD-10-CM | POA: Insufficient documentation

## 2022-01-12 HISTORY — DX: Trichomonal vulvovaginitis: A59.01

## 2022-01-12 LAB — CYTOLOGY - PAP
Comment: NEGATIVE
Diagnosis: NEGATIVE
High risk HPV: NEGATIVE

## 2022-01-12 MED ORDER — METRONIDAZOLE 500 MG PO TABS: 500.0000 mg | ORAL_TABLET | Freq: Two times a day (BID) | ORAL | 0 refills | Status: AC

## 2022-01-12 NOTE — Progress Notes (Signed)
Patient has trichomonal vaginitis noted on pap smear.  Recommend testing for other STIs, also needs to let partner(s) know so the partner(s) can get testing and treatment. Patient and sex partner(s) should abstain from unprotected sexual activity for seven days after everyone receives appropriate treatment.  Metronidazole was prescribed for patient.  Patient will need to return in about 4 weeks after treatment for repeat test of cure.  Please call to inform patient of results and recommendations, and advise to pick up prescription and take as directed.  Please advise patient to practice safe sex at all times.  Verita Schneiders, MD

## 2022-01-13 ENCOUNTER — Telehealth: Payer: Self-pay

## 2022-01-13 NOTE — Telephone Encounter (Signed)
Attempted to contact about results, no answer, left vm 

## 2022-01-16 ENCOUNTER — Telehealth: Payer: Self-pay | Admitting: *Deleted

## 2022-01-16 NOTE — Telephone Encounter (Signed)
Pt called and discussed recent lab results.  Pt states she has not had any symptoms until recent- discharge only.  Pt states she has not had sexual intercourse in 2-3 years and doesn't know how she contracted this.  Pt made aware of Rx and states she will take.  Pt advised msg to be sent to provider for review and any guidance needed.    Please review and advise.

## 2022-01-23 ENCOUNTER — Other Ambulatory Visit: Payer: Self-pay | Admitting: Internal Medicine

## 2022-01-25 ENCOUNTER — Encounter: Payer: Self-pay | Admitting: Internal Medicine

## 2022-01-25 ENCOUNTER — Ambulatory Visit (INDEPENDENT_AMBULATORY_CARE_PROVIDER_SITE_OTHER): Payer: Medicare Other | Admitting: Internal Medicine

## 2022-01-25 DIAGNOSIS — J0110 Acute frontal sinusitis, unspecified: Secondary | ICD-10-CM

## 2022-01-25 MED ORDER — AMOXICILLIN-POT CLAVULANATE 875-125 MG PO TABS: 1.0000 | ORAL_TABLET | Freq: Two times a day (BID) | ORAL | 0 refills | Status: AC

## 2022-01-25 NOTE — Patient Instructions (Signed)
We have sent in augmentin to take 1 pill twice a day for 1 week.

## 2022-01-25 NOTE — Progress Notes (Signed)
   Subjective:   Patient ID: Lori Oconnell, female    DOB: 02/11/1961, 61 y.o.   MRN: [id number]  HPI The patient is a 61 YO female coming in for sinus issues and the drainage is going into stomach and causes nausea.  Review of Systems  Constitutional:  Positive for appetite change. Negative for activity change, chills, fatigue, fever and unexpected weight change.  HENT:  Positive for congestion, postnasal drip, rhinorrhea and sinus pressure. Negative for ear discharge, ear pain, sinus pain, sneezing, sore throat, tinnitus, trouble swallowing and voice change.   Eyes: Negative.   Respiratory:  Positive for cough. Negative for chest tightness, shortness of breath and wheezing.   Cardiovascular: Negative.   Gastrointestinal:  Positive for nausea.  Musculoskeletal:  Negative for myalgias.  Neurological: Negative.     Objective:  Physical Exam Constitutional:      Appearance: She is well-developed.  HENT:     Head: Normocephalic and atraumatic.     Comments: Oropharynx with redness and clear drainage, nose with swollen turbinates, TMs normal bilaterally.     Ears:     Comments: Sinus pressure frontal Neck:     Thyroid: No thyromegaly.  Cardiovascular:     Rate and Rhythm: Normal rate and regular rhythm.  Pulmonary:     Effort: Pulmonary effort is normal. No respiratory distress.     Breath sounds: Normal breath sounds. No wheezing or rales.  Abdominal:     Palpations: Abdomen is soft.  Musculoskeletal:     Cervical back: Normal range of motion.  Lymphadenopathy:     Cervical: No cervical adenopathy.  Skin:    General: Skin is warm and dry.  Neurological:     Mental Status: She is alert and oriented to person, place, and time.     Vitals:   01/25/22 1055  BP: 138/80  Pulse: 72  Temp: 98.2 F (36.8 C)  TempSrc: Oral  SpO2: 99%  Weight: 179 lb (81.2 kg)  Height: '5\' 5"'$  (1.651 m)    Assessment & Plan:

## 2022-01-27 NOTE — Assessment & Plan Note (Signed)
Rx augmentin 1 week to help symptoms. Advised smoking can thicken secretions and to stop smoking.

## 2022-02-24 ENCOUNTER — Ambulatory Visit
Admission: EM | Admit: 2022-02-24 | Discharge: 2022-02-24 | Disposition: A | Discharge status: Home / Self Care | Payer: Medicare Other | Attending: Physician Assistant | Admitting: Physician Assistant

## 2022-02-24 ENCOUNTER — Encounter: Payer: Self-pay | Admitting: Physician Assistant

## 2022-02-24 DIAGNOSIS — Z1152 Encounter for screening for COVID-19: Secondary | ICD-10-CM

## 2022-02-24 DIAGNOSIS — J069 Acute upper respiratory infection, unspecified: Secondary | ICD-10-CM | POA: Diagnosis not present

## 2022-02-24 NOTE — ED Provider Notes (Signed)
[facility]    CSN: [id number] Arrival date & time: 02/24/22  1136      History   Chief Complaint Chief Complaint  Patient presents with   Cough   Nasal Congestion    HPI Lori Oconnell is a 61 y.o. female.   Patient here today for evaluation of cough, congestion that has been ongoing for 4 days.  She has been taking over-the-counter medication without significant relief.  She denies any fever.  She has not had any sore throat or ear pain.  She denies any nausea, vomiting or diarrhea.  The history is provided by the patient.  Cough Associated symptoms: no chills, no ear pain, no eye discharge, no fever, no shortness of breath, no sore throat and no wheezing     Past Medical History:  Diagnosis Date   Anxiety    DISC DISEASE, CERVICAL 07/13/2009   Edema 03/22/2011   HEMATOCHEZIA 11/30/2009   HYPERLIPIDEMIA 07/18/2009   HYPERTENSION 07/12/2009   Irregular menses 03/22/2011   NECK PAIN 07/12/2009   OVERACTIVE BLADDER 07/12/2009   RENAL INSUFFICIENCY 07/12/2009   SINUSITIS- ACUTE-NOS 07/12/2009   Sleep apnea    prescribed cpap but has not gotten   TRANSAMINASES, SERUM, ELEVATED 07/12/2009   Trichomonal vaginitis 01/12/2022    Patient Active Problem List   Diagnosis Date Noted   Trichomonal vaginitis 01/12/2022   Vitamin D deficiency 11/17/2021   Hyperglycemia 11/17/2021   Acute left-sided low back pain with left-sided sciatica 08/24/2021   Bilateral thumb pain 08/24/2021   Itching 05/22/2021   Urinary frequency 05/18/2021   Smoker 78/29/5621   Alcoholic hepatitis without ascites 10/20/2020   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   Supratherapeutic INR 10/19/2020   Hyperbilirubinemia 10/19/2020   Hyponatremia 10/19/2020   AKI (acute kidney injury) ([place]) 10/19/2020   Hepatic encephalopathy ([place]) 10/18/2020   Allergic rhinitis 06/12/2019   Allergic conjunctivitis 06/12/2019   Abnormal LFTs 06/12/2019   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   Poor  dentition 05/15/2018   Left ankle swelling 11/04/2015   Rash 11/26/2014   Hot flashes 07/02/2013   Macrocytic anemia 12/10/2012   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   Depression with anxiety 11/09/2012   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   Mass on back 08/13/2012   Lumbar radicular pain 10/08/2011   Lipoma of neck 10/08/2011   Neck pain 03/22/2011   Edema 03/22/2011   Irregular menses 03/22/2011   Encounter for well adult exam with abnormal findings 03/19/2011   HEMATOCHEZIA 11/30/2009   Hematochezia 11/30/2009   Hyperlipidemia 07/18/2009   [place] DISEASE, CERVICAL 07/13/2009   Essential hypertension 07/12/2009   Acute sinusitis 07/12/2009   RENAL INSUFFICIENCY 07/12/2009   OVERACTIVE BLADDER 07/12/2009   NECK PAIN 07/12/2009    Past Surgical History:  Procedure Laterality Date   COLONOSCOPY WITH PROPOFOL N/A 01/11/2021   Procedure: COLONOSCOPY WITH PROPOFOL;  Surgeon: [name], MD;  Location: [facility];  Service: Gastroenterology;  Laterality: N/A;   ESOPHAGOGASTRODUODENOSCOPY (EGD) WITH PROPOFOL N/A 01/11/2021   Procedure: ESOPHAGOGASTRODUODENOSCOPY (EGD) WITH PROPOFOL;  Surgeon: [name], MD;  Location: [facility];  Service: Gastroenterology;  Laterality: N/A;   MASS EXCISION N/A 08/27/2012   Procedure: EXCISION MASS UPPER BACK;  Surgeon: Joyice Faster. Cornett, MD;  Location: [facility];  Service: General;  Laterality: N/A;   POLYPECTOMY  01/11/2021   Procedure: POLYPECTOMY;  Surgeon: [name], MD;  Location: [facility];  Service: Gastroenterology;;   s/p ganglion cyst Right [place]  OB History     Gravida  3   Para      Term      Preterm      AB      Living  3      SAB      IAB      Ectopic      Multiple      Live Births  3            Home Medications    Prior to Admission medications   Medication Sig Start Date End Date Taking? Authorizing Provider  Carboxymethylcellul-Glycerin (CLEAR EYES FOR DRY EYES) 1-0.25 % SOLN  Place 1 drop into both eyes daily as needed (dry eyes).    [provider]  citalopram (CELEXA) 20 MG tablet TAKE 1 TABLET BY MOUTH EVERY DAY 01/23/22   [name], MD  furosemide (LASIX) 40 MG tablet Take 40 mg by mouth daily.    [provider]  hydrOXYzine (ATARAX) 25 MG tablet Take 25 mg by mouth 3 (three) times daily as needed. 07/19/21   [provider]  lactulose (CHRONULAC) 10 GM/15ML solution Take 15 mLs (10 g total) by mouth 2 (two) times daily. 10/27/20   Kayleen Memos, DO  meloxicam (MOBIC) 15 MG tablet Take 1 tablet (15 mg total) by mouth daily. 08/31/21   Glennon Mac, DO  methocarbamol (ROBAXIN) 500 MG tablet Take 1 tablet (500 mg total) by mouth every 8 (eight) hours as needed for muscle spasms. 08/24/21   Binnie Rail, MD  polyethylene glycol-electrolytes (NULYTELY) 420 g solution See admin instructions. 11/16/20   [provider]  spironolactone (ALDACTONE) 100 MG tablet Take 100 mg by mouth daily.    [provider]  triamcinolone cream (KENALOG) 0.1 % Apply 1 application topically 2 (two) times daily. 05/11/21 05/11/22  [name], MD  Vitamin D, Ergocalciferol, (DRISDOL) 1.25 MG (50000 UT) CAPS capsule Take 1 capsule (50,000 Units total) by mouth every 7 (seven) days. 06/15/19   [name], MD    Family History Family History  Problem Relation Age of Onset   Hypertension Mother    Kidney failure Mother    Diabetes Maternal Aunt    Colon cancer Neg Hx     Social History Social History   Tobacco Use   Smoking status: Some Days    Packs/day: 0.25    Years: 5.00    Total pack years: 1.25    Types: Cigarettes   Smokeless tobacco: Former    Types: Chew   Tobacco comments:    smokes 2 to 3 cigarettes a day  Vaping Use   Vaping Use: Never used  Substance Use Topics   Alcohol use: Not Currently    Alcohol/week: 3.0 standard drinks of alcohol    Types: 3 Standard drinks or equivalent per week   Drug use: No      Allergies   Patient has no known allergies.   Review of Systems Review of Systems  Constitutional:  Negative for chills and fever.  HENT:  Positive for congestion. Negative for ear pain and sore throat.   Eyes:  Negative for discharge and redness.  Respiratory:  Positive for cough. Negative for shortness of breath and wheezing.   Gastrointestinal:  Negative for abdominal pain, diarrhea, nausea and vomiting.     Physical Exam Triage Vital Signs ED Triage Vitals  Enc Vitals Group     BP 02/24/22 1225 (!) 150/89  Pulse Rate 02/24/22 1225 85     Resp 02/24/22 1225 18     Temp 02/24/22 1225 98 F (36.7 C)     Temp src --      SpO2 02/24/22 1225 96 %     Weight --      Height --      Head Circumference --      Peak Flow --      Pain Score 02/24/22 1228 4     Pain Loc --      Pain Edu? --      Excl. in [place]? --    No data found.  Updated Vital Signs BP (!) 150/89   Pulse 85   Temp 98 F (36.7 C)   Resp 18   LMP 03/19/2011   SpO2 96%      Physical Exam Vitals and nursing note reviewed.  Constitutional:      General: She is not in acute distress.    Appearance: Normal appearance. She is not ill-appearing.  HENT:     Head: Normocephalic and atraumatic.     Nose: Congestion present.     Mouth/Throat:     Mouth: Mucous membranes are moist.     Pharynx: No oropharyngeal exudate or posterior oropharyngeal erythema.  Eyes:     Conjunctiva/sclera: Conjunctivae normal.  Cardiovascular:     Rate and Rhythm: Normal rate and regular rhythm.     Heart sounds: Normal heart sounds. No murmur heard. Pulmonary:     Effort: Pulmonary effort is normal. No respiratory distress.     Breath sounds: Normal breath sounds. No wheezing, rhonchi or rales.  Skin:    General: Skin is warm and dry.  Neurological:     Mental Status: She is alert.  Psychiatric:        Mood and Affect: Mood normal.        Thought Content: Thought content normal.      UC Treatments /  Results  Labs (all labs ordered are listed, but only abnormal results are displayed) Labs Reviewed  SARS CORONAVIRUS 2 (TAT 6-24 HRS)    EKG   Radiology No results found.  Procedures Procedures (including critical care time)  Medications Ordered in UC Medications - No data to display  Initial Impression / Assessment and Plan / UC Course  I have reviewed the triage vital signs and the nursing notes.  Pertinent labs & imaging results that were available during my care of the patient were reviewed by me and considered in my medical decision making (see chart for details).  Suspect viral etiology of symptoms.  Will order screening for COVID.  Will await results for further recommendation.  Encouraged symptomatic treatment, increased fluids and rest with follow-up with any further concerns.  Final Clinical Impressions(s) / UC Diagnoses   Final diagnoses:  Acute upper respiratory infection  Encounter for screening for COVID-19   Discharge Instructions   None    ED Prescriptions   None    PDMP not reviewed this encounter.   Francene Finders, PA-C 02/24/22 1709

## 2022-02-24 NOTE — ED Triage Notes (Signed)
Pt presents to uc with co of cough and congestion. Pt reports this has been going on for 4 days, pt reports taking musinex and some yellow phlegm.

## 2022-02-25 LAB — SARS CORONAVIRUS 2 (TAT 6-24 HRS): SARS Coronavirus 2: NEGATIVE

## 2022-03-20 ENCOUNTER — Ambulatory Visit
Admission: RE | Admit: 2022-03-20 | Discharge: 2022-03-20 | Disposition: A | Discharge status: Home / Self Care | Payer: Medicare Other | Source: Ambulatory Visit | Attending: Obstetrics and Gynecology | Admitting: Obstetrics and Gynecology

## 2022-03-20 DIAGNOSIS — Z1231 Encounter for screening mammogram for malignant neoplasm of breast: Secondary | ICD-10-CM | POA: Diagnosis not present

## 2022-03-20 DIAGNOSIS — Z01419 Encounter for gynecological examination (general) (routine) without abnormal findings: Secondary | ICD-10-CM

## 2022-04-20 DIAGNOSIS — K219 Gastro-esophageal reflux disease without esophagitis: Secondary | ICD-10-CM | POA: Diagnosis not present

## 2022-04-20 DIAGNOSIS — K5900 Constipation, unspecified: Secondary | ICD-10-CM | POA: Diagnosis not present

## 2022-05-23 ENCOUNTER — Ambulatory Visit (INDEPENDENT_AMBULATORY_CARE_PROVIDER_SITE_OTHER): Payer: Medicare Other | Admitting: Internal Medicine

## 2022-05-23 VITALS — BP 136/80 | HR 83 | Temp 97.9°F | Ht 65.0 in | Wt 191.0 lb

## 2022-05-23 DIAGNOSIS — E7800 Pure hypercholesterolemia, unspecified: Secondary | ICD-10-CM

## 2022-05-23 DIAGNOSIS — R739 Hyperglycemia, unspecified: Secondary | ICD-10-CM

## 2022-05-23 DIAGNOSIS — E881 Lipodystrophy, not elsewhere classified: Secondary | ICD-10-CM | POA: Diagnosis not present

## 2022-05-23 DIAGNOSIS — N318 Other neuromuscular dysfunction of bladder: Secondary | ICD-10-CM | POA: Diagnosis not present

## 2022-05-23 DIAGNOSIS — E559 Vitamin D deficiency, unspecified: Secondary | ICD-10-CM

## 2022-05-23 DIAGNOSIS — M25511 Pain in right shoulder: Secondary | ICD-10-CM | POA: Diagnosis not present

## 2022-05-23 DIAGNOSIS — F17210 Nicotine dependence, cigarettes, uncomplicated: Secondary | ICD-10-CM

## 2022-05-23 DIAGNOSIS — G8929 Other chronic pain: Secondary | ICD-10-CM

## 2022-05-23 DIAGNOSIS — M25512 Pain in left shoulder: Secondary | ICD-10-CM | POA: Diagnosis not present

## 2022-05-23 DIAGNOSIS — F17200 Nicotine dependence, unspecified, uncomplicated: Secondary | ICD-10-CM

## 2022-05-23 LAB — VITAMIN D 25 HYDROXY (VIT D DEFICIENCY, FRACTURES): VITD: 45.14 ng/mL (ref 30.00–100.00)

## 2022-05-23 LAB — HEPATIC FUNCTION PANEL
ALT: 9 U/L (ref 0–35)
AST: 15 U/L (ref 0–37)
Albumin: 4.3 g/dL (ref 3.5–5.2)
Alkaline Phosphatase: 151 U/L — ABNORMAL HIGH (ref 39–117)
Bilirubin, Direct: 0.1 mg/dL (ref 0.0–0.3)
Total Bilirubin: 0.6 mg/dL (ref 0.2–1.2)
Total Protein: 7.1 g/dL (ref 6.0–8.3)

## 2022-05-23 LAB — CBC WITH DIFFERENTIAL/PLATELET
Basophils Absolute: 0 10*3/uL (ref 0.0–0.1)
Basophils Relative: 0.4 % (ref 0.0–3.0)
Eosinophils Absolute: 0.4 10*3/uL (ref 0.0–0.7)
Eosinophils Relative: 5.7 % — ABNORMAL HIGH (ref 0.0–5.0)
HCT: 36.2 % (ref 36.0–46.0)
Hemoglobin: 12.4 g/dL (ref 12.0–15.0)
Lymphocytes Relative: 44.9 % (ref 12.0–46.0)
Lymphs Abs: 2.8 10*3/uL (ref 0.7–4.0)
MCHC: 34.2 g/dL (ref 30.0–36.0)
MCV: 96.9 fl (ref 78.0–100.0)
Monocytes Absolute: 0.5 10*3/uL (ref 0.1–1.0)
Monocytes Relative: 8.3 % (ref 3.0–12.0)
Neutro Abs: 2.5 10*3/uL (ref 1.4–7.7)
Neutrophils Relative %: 40.7 % — ABNORMAL LOW (ref 43.0–77.0)
Platelets: 193 10*3/uL (ref 150.0–400.0)
RBC: 3.74 Mil/uL — ABNORMAL LOW (ref 3.87–5.11)
RDW: 12.7 % (ref 11.5–15.5)
WBC: 6.2 10*3/uL (ref 4.0–10.5)

## 2022-05-23 LAB — BASIC METABOLIC PANEL
BUN: 15 mg/dL (ref 6–23)
CO2: 30 mEq/L (ref 19–32)
Calcium: 9.6 mg/dL (ref 8.4–10.5)
Chloride: 104 mEq/L (ref 96–112)
Creatinine, Ser: 0.82 mg/dL (ref 0.40–1.20)
GFR: 78.17 mL/min (ref >60.00)
Glucose, Bld: 84 mg/dL (ref 70–99)
Potassium: 3.9 mEq/L (ref 3.5–5.1)
Sodium: 140 mEq/L (ref 135–145)

## 2022-05-23 LAB — LIPID PANEL
Cholesterol: 203 mg/dL — ABNORMAL HIGH (ref 0–200)
HDL: 70 mg/dL (ref >39.00)
LDL Cholesterol: 107 mg/dL — ABNORMAL HIGH (ref 0–99)
NonHDL: 132.8
Total CHOL/HDL Ratio: 3
Triglycerides: 131 mg/dL (ref 0.0–149.0)
VLDL: 26.2 mg/dL (ref 0.0–40.0)

## 2022-05-23 LAB — HEMOGLOBIN A1C: Hgb A1c MFr Bld: 5.9 % (ref 4.6–6.5)

## 2022-05-23 LAB — TSH: TSH: 1.43 u[IU]/mL (ref 0.35–5.50)

## 2022-05-23 MED ORDER — SOLIFENACIN SUCCINATE 5 MG PO TABS: 5.0000 mg | ORAL_TABLET | Freq: Every day | ORAL | 3 refills | Status: DC

## 2022-05-23 NOTE — Assessment & Plan Note (Signed)
With worsening symptoms, for UA but also vesicare 5 mg qd

## 2022-05-23 NOTE — Assessment & Plan Note (Signed)
Much worsening for some reason recently now activity limiting and discomfort - for plastic surgury referral

## 2022-05-23 NOTE — Assessment & Plan Note (Signed)
Lab Results  Component Value Date   HGBA1C 5.7 11/17/2021   Stable, pt to continue current medical treatment - diet, wt control, excercise

## 2022-05-23 NOTE — Assessment & Plan Note (Signed)
Lab Results  Component Value Date   LDLCALC 80 11/17/2021   Stable, pt to continue current low chol diet, declines statin

## 2022-05-23 NOTE — Patient Instructions (Signed)
Please take all new medication as prescribed - the vesicare 5 mg per day for the bladder  You will be contacted regarding the referral for: Dr Veverly Fells for the left shoulder  You will be contacted regarding the referral for: Plastic Surgury  Please continue all other medications as before, and refills have been done if requested.  Please have the pharmacy call with any other refills you may need.  Please continue your efforts at being more active, low cholesterol diet, and weight control.  Please keep your appointments with your specialists as you may have planned  Please go to the LAB at the blood drawing area for the tests to be done  You will be contacted by phone if any changes need to be made immediately.  Otherwise, you will receive a letter about your results with an explanation, but please check with MyChart first.  Please remember to sign up for MyChart if you have not done so, as this will be important to you in the future with finding out test results, communicating by private email, and scheduling acute appointments online when needed.  Please make an Appointment to return in 6 months, or sooner if needed
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
## 2022-05-23 NOTE — Assessment & Plan Note (Signed)
Last vitamin D Lab Results  Component Value Date   VD25OH 47.54 11/17/2021   Stable, cont oral replacement

## 2022-05-23 NOTE — Assessment & Plan Note (Signed)
Pt counsled to smoke, pt not ready
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
## 2022-05-24 LAB — URINALYSIS, ROUTINE W REFLEX MICROSCOPIC
Bilirubin Urine: NEGATIVE
Hgb urine dipstick: NEGATIVE
Ketones, ur: NEGATIVE
Leukocytes,Ua: NEGATIVE
Nitrite: NEGATIVE
RBC / HPF: NONE SEEN (ref 0–?)
Specific Gravity, Urine: 1.015 (ref 1.000–1.030)
Total Protein, Urine: NEGATIVE
Urine Glucose: NEGATIVE
Urobilinogen, UA: 0.2 (ref 0.0–1.0)
WBC, UA: NONE SEEN (ref 0–?)
pH: 6 (ref 5.0–8.0)

## 2022-05-26 ENCOUNTER — Ambulatory Visit
Admission: EM | Admit: 2022-05-26 | Discharge: 2022-05-26 | Disposition: A | Discharge status: Home / Self Care | Payer: Medicare Other | Attending: Physician Assistant | Admitting: Physician Assistant

## 2022-05-26 ENCOUNTER — Encounter: Payer: Self-pay | Admitting: Emergency Medicine

## 2022-05-26 DIAGNOSIS — T7840XA Allergy, unspecified, initial encounter: Secondary | ICD-10-CM | POA: Diagnosis not present

## 2022-05-26 DIAGNOSIS — L299 Pruritus, unspecified: Secondary | ICD-10-CM | POA: Diagnosis not present

## 2022-05-26 MED ORDER — LORATADINE 10 MG PO TABS: 10.0000 mg | ORAL_TABLET | Freq: Every day | ORAL | 0 refills | Status: DC

## 2022-05-26 MED ORDER — DIPHENHYDRAMINE HCL 25 MG PO CAPS
25.0000 mg | ORAL_CAPSULE | Freq: Once | ORAL | Status: AC
  Administered 2022-05-26: 25 mg via ORAL

## 2022-05-26 MED ORDER — PREDNISONE 10 MG PO TABS: 10.0000 mg | ORAL_TABLET | Freq: Every day | ORAL | 0 refills | Status: DC

## 2022-05-26 MED ORDER — METHYLPREDNISOLONE SODIUM SUCC 125 MG IJ SOLR
80.0000 mg | Freq: Once | INTRAMUSCULAR | Status: AC
  Administered 2022-05-26: 80 mg via INTRAMUSCULAR

## 2022-05-26 NOTE — ED Provider Notes (Signed)
[facility]    CSN: [id number] Arrival date & time: 05/26/22  5643      History   Chief Complaint Chief Complaint  Patient presents with   Facial Swelling   Allergic Reaction    HPI Lori Oconnell is a 61 y.o. female.   61 year old female presents with allergic reaction.  Patient indicates that Wednesday she used a hair dye and then shortly afterwards and on Thursday morning she woke up and her face started swelling.  Patient indicates that she was having significant itching along with the swelling.  Patient indicates that she felt that the swelling would resolve over 24 hours however this morning she woke up with the swelling still being significant and her left eye being swollen shut, and significant swelling on the right side as well.  Patient denies any vision changes, no wheezing or shortness of breath.  Patient indicates that she is not having any difficulty swallowing and relates that her tongue is not swelling.  Patient denies any fever or chills.  Patient indicates she has not taken any medicine to help decrease the swelling.  She does indicate that she used a hair dye about a year ago when she was in [place] and similar symptoms occurred and she had to get a steroid shot to help it resolve.  She denies any fever or nausea or vomiting.   Allergic Reaction Presenting symptoms: rash (facial swelling)     Past Medical History:  Diagnosis Date   Anxiety    DISC DISEASE, CERVICAL 07/13/2009   Edema 03/22/2011   HEMATOCHEZIA 11/30/2009   HYPERLIPIDEMIA 07/18/2009   HYPERTENSION 07/12/2009   Irregular menses 03/22/2011   NECK PAIN 07/12/2009   OVERACTIVE BLADDER 07/12/2009   RENAL INSUFFICIENCY 07/12/2009   SINUSITIS- ACUTE-NOS 07/12/2009   Sleep apnea    prescribed cpap but has not gotten   TRANSAMINASES, SERUM, ELEVATED 07/12/2009   Trichomonal vaginitis 01/12/2022    Patient Active Problem List   Diagnosis Date Noted   Left shoulder pain 05/23/2022   Trichomonal  vaginitis 01/12/2022   Vitamin D deficiency 11/17/2021   Hyperglycemia 11/17/2021   Acute left-sided low back pain with left-sided sciatica 08/24/2021   Bilateral thumb pain 08/24/2021   Itching 05/22/2021   Urinary frequency 05/18/2021   Smoker 32/95/1884   Alcoholic hepatitis without ascites 10/20/2020   Cirrhosis of liver with ascites ([place]) 10/19/2020   Supratherapeutic INR 10/19/2020   Hyperbilirubinemia 10/19/2020   Hyponatremia 10/19/2020   AKI (acute kidney injury) ([place]) 10/19/2020   Hepatic encephalopathy ([place]) 10/18/2020   Allergic rhinitis 06/12/2019   Allergic conjunctivitis 06/12/2019   Abnormal LFTs 06/12/2019   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   Poor dentition 05/15/2018   Left ankle swelling 11/04/2015   Rash 11/26/2014   Hot flashes 07/02/2013   Macrocytic anemia 12/10/2012   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   Depression with anxiety 11/09/2012   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   Mass on back 08/13/2012   Lumbar radicular pain 10/08/2011   Lipoma of neck 10/08/2011   Neck pain 03/22/2011   Edema 03/22/2011   Irregular menses 03/22/2011   Encounter for well adult exam with abnormal findings 03/19/2011   HEMATOCHEZIA 11/30/2009   Hematochezia 11/30/2009   Hyperlipidemia 07/18/2009   [place] DISEASE, CERVICAL 07/13/2009   Essential hypertension 07/12/2009   Acute sinusitis 07/12/2009   RENAL INSUFFICIENCY 07/12/2009   OVERACTIVE BLADDER 07/12/2009   NECK PAIN 07/12/2009    Past Surgical History:  Procedure Laterality Date   COLONOSCOPY WITH PROPOFOL N/A  01/11/2021   Procedure: COLONOSCOPY WITH PROPOFOL;  Surgeon: [name], MD;  Location: [facility];  Service: Gastroenterology;  Laterality: N/A;   ESOPHAGOGASTRODUODENOSCOPY (EGD) WITH PROPOFOL N/A 01/11/2021   Procedure: ESOPHAGOGASTRODUODENOSCOPY (EGD) WITH PROPOFOL;  Surgeon: [name], MD;  Location: [facility];  Service: Gastroenterology;  Laterality: N/A;   MASS EXCISION N/A 08/27/2012    Procedure: EXCISION MASS UPPER BACK;  Surgeon: Joyice Faster. Cornett, MD;  Location: [place];  Service: General;  Laterality: N/A;   POLYPECTOMY  01/11/2021   Procedure: POLYPECTOMY;  Surgeon: [name], MD;  Location: [facility];  Service: Gastroenterology;;   s/p ganglion cyst Right 1980   [place]    OB History     Gravida  3   Para      Term      Preterm      AB      Living  3      SAB      IAB      Ectopic      Multiple      Live Births  3            Home Medications    Prior to Admission medications   Medication Sig Start Date End Date Taking? Authorizing Provider  loratadine (CLARITIN) 10 MG tablet Take 1 tablet (10 mg total) by mouth daily. 05/26/22  Yes Nyoka Lint, PA-C  predniSONE (DELTASONE) 10 MG tablet Take 1 tablet (10 mg total) by mouth daily. 05/26/22  Yes Nyoka Lint, PA-C  Carboxymethylcellul-Glycerin (CLEAR EYES FOR DRY EYES) 1-0.25 % SOLN Place 1 drop into both eyes daily as needed (dry eyes).    [provider]  citalopram (CELEXA) 20 MG tablet TAKE 1 TABLET BY MOUTH EVERY DAY 01/23/22   [name], MD  furosemide (LASIX) 40 MG tablet Take 40 mg by mouth daily.    [provider]  hydrOXYzine (ATARAX) 25 MG tablet Take 25 mg by mouth 3 (three) times daily as needed. 07/19/21   [provider]  lactulose (CHRONULAC) 10 GM/15ML solution Take 15 mLs (10 g total) by mouth 2 (two) times daily. 10/27/20   Kayleen Memos, DO  meloxicam (MOBIC) 15 MG tablet Take 1 tablet (15 mg total) by mouth daily. 08/31/21   Glennon Mac, DO  methocarbamol (ROBAXIN) 500 MG tablet Take 1 tablet (500 mg total) by mouth every 8 (eight) hours as needed for muscle spasms. 08/24/21   Binnie Rail, MD  pantoprazole (PROTONIX) 40 MG tablet Take 40 mg by mouth daily. 05/15/22   [provider]  polyethylene glycol-electrolytes (NULYTELY) 420 g solution See admin instructions. 11/16/20   [provider]  solifenacin  (VESICARE) 5 MG tablet Take 1 tablet (5 mg total) by mouth daily. 05/23/22   [name], MD  spironolactone (ALDACTONE) 100 MG tablet Take 100 mg by mouth daily.    [provider]  Vitamin D, Ergocalciferol, (DRISDOL) 1.25 MG (50000 UT) CAPS capsule Take 1 capsule (50,000 Units total) by mouth every 7 (seven) days. 06/15/19   [name], MD    Family History Family History  Problem Relation Age of Onset   Hypertension Mother    Kidney failure Mother    Diabetes Maternal Aunt    Colon cancer Neg Hx     Social History Social History   Tobacco Use   Smoking status: Some Days    Packs/day: 0.25    Years: 5.00    Total  pack years: 1.25    Types: Cigarettes   Smokeless tobacco: Former    Types: Chew   Tobacco comments:    smokes 2 to 3 cigarettes a day  Vaping Use   Vaping Use: Never used  Substance Use Topics   Alcohol use: Not Currently    Alcohol/week: 3.0 standard drinks of alcohol    Types: 3 Standard drinks or equivalent per week   Drug use: No     Allergies   Patient has no known allergies.   Review of Systems Review of Systems  Skin:  Positive for rash (facial swelling).     Physical Exam Triage Vital Signs ED Triage Vitals  Enc Vitals Group     BP 05/26/22 1036 (!) 157/90     Pulse Rate 05/26/22 1036 72     Resp 05/26/22 1036 18     Temp 05/26/22 1036 98 F (36.7 C)     Temp src --      SpO2 05/26/22 1036 98 %     Weight --      Height --      Head Circumference --      Peak Flow --      Pain Score 05/26/22 1035 0     Pain Loc --      Pain Edu? --      Excl. in [place]? --    No data found.  Updated Vital Signs BP (!) 157/90   Pulse 72   Temp 98 F (36.7 C)   Resp 18   LMP 03/19/2011   SpO2 98%   Visual Acuity Right Eye Distance:   Left Eye Distance:   Bilateral Distance:    Right Eye Near:   Left Eye Near:    Bilateral Near:     Physical Exam Constitutional:      Appearance: Normal appearance.  HENT:     Right  Ear: Tympanic membrane and ear canal normal.     Left Ear: Tympanic membrane and ear canal normal.     Mouth/Throat:     Mouth: Mucous membranes are moist.     Pharynx: Oropharynx is clear.     Comments: Face: Significant facial swelling is present with the left eye being swollen shut and the right 60% swelling around the upper and lower lids.  Generalized facial swelling around the facial and scalp area.  There is no lip swelling, there is no swelling of the tongue. Cardiovascular:     Rate and Rhythm: Normal rate and regular rhythm.     Heart sounds: Normal heart sounds.  Pulmonary:     Effort: Pulmonary effort is normal.     Breath sounds: Normal breath sounds and air entry. No wheezing, rhonchi or rales.  Lymphadenopathy:     Cervical: No cervical adenopathy.  Neurological:     Mental Status: She is alert.      UC Treatments / Results  Labs (all labs ordered are listed, but only abnormal results are displayed) Labs Reviewed - No data to display  EKG   Radiology No results found.  Procedures Procedures (including critical care time)  Medications Ordered in UC Medications  methylPREDNISolone sodium succinate (SOLU-MEDROL) 125 mg/2 mL injection 80 mg (has no administration in time range)  diphenhydrAMINE (BENADRYL) capsule 25 mg (has no administration in time range)    Initial Impression / Assessment and Plan / UC Course  I have reviewed the triage vital signs and the nursing notes.  Pertinent labs & imaging  results that were available during my care of the patient were reviewed by me and considered in my medical decision making (see chart for details).    Plan: 1.  The allergic reaction will be treated with the following: A.  Solu-Medrol 80 mg given IM in the office. B.  Benadryl 25 mg given in the office. C.  Claritin 10 mg daily until the swelling resolves. D.  Prednisone 10 mg 3 times a day for 5 days only to help reduce the swelling. 2.  The itching will be  treated with the following: A.  Advised cool compresses frequently to the eyes to help reduce the swelling and itching. 3.  Advised to follow-up with PCP or return to urgent care if symptoms fail to improve. Final Clinical Impressions(s) / UC Diagnoses   Final diagnoses:  Allergic reaction, initial encounter  Itching     Discharge Instructions      Advised to take Claritin 10 mg once daily until the swelling resolves. Advised take Benadryl 25 mg every 6 hours on a regular basis until swelling resolves.  (Be cautious with this medication as it causes drowsiness) Advised take prednisone 10 mg 3 times a day for 5 days only as this will help reduce the swelling. Advised to use cool compresses to the eyes frequently as this will help reduce the swelling. Advised to follow-up with PCP or return to urgent care if symptoms fail to improve.    ED Prescriptions     Medication Sig Dispense Auth. Provider   loratadine (CLARITIN) 10 MG tablet Take 1 tablet (10 mg total) by mouth daily. 10 tablet Nyoka Lint, PA-C   predniSONE (DELTASONE) 10 MG tablet Take 1 tablet (10 mg total) by mouth daily. 15 tablet Nyoka Lint, PA-C      PDMP not reviewed this encounter.   Nyoka Lint, PA-C 05/26/22 1104

## 2022-05-26 NOTE — Discharge Instructions (Signed)
Advised to take Claritin 10 mg once daily until the swelling resolves. Advised take Benadryl 25 mg every 6 hours on a regular basis until swelling resolves.  (Be cautious with this medication as it causes drowsiness) Advised take prednisone 10 mg 3 times a day for 5 days only as this will help reduce the swelling. Advised to use cool compresses to the eyes frequently as this will help reduce the swelling. Advised to follow-up with PCP or return to urgent care if symptoms fail to improve.

## 2022-05-26 NOTE — ED Triage Notes (Signed)
Pt is present today with facial swelling due to an allergic reaction to hair dye on Wednesday. Pt denies any SOB

## 2022-06-28 ENCOUNTER — Ambulatory Visit (INDEPENDENT_AMBULATORY_CARE_PROVIDER_SITE_OTHER): Payer: 59 | Admitting: Plastic Surgery

## 2022-06-28 ENCOUNTER — Telehealth: Payer: Self-pay | Admitting: *Deleted

## 2022-06-28 ENCOUNTER — Encounter: Payer: Self-pay | Admitting: Plastic Surgery

## 2022-06-28 VITALS — BP 184/101 | HR 96 | Ht 65.0 in | Wt 196.6 lb

## 2022-06-28 DIAGNOSIS — I10 Essential (primary) hypertension: Secondary | ICD-10-CM | POA: Diagnosis not present

## 2022-06-28 DIAGNOSIS — E65 Localized adiposity: Secondary | ICD-10-CM | POA: Diagnosis not present

## 2022-06-28 NOTE — Progress Notes (Signed)
Referring Provider [name], MD [place],  [address] [address]   CC:  Chief Complaint  Patient presents with   Advice Only      Lori Oconnell is an 62 y.o. female.  HPI: Lori Oconnell is a very pleasant 62 year old female who presents today for evaluation of progressive fat hypertrophy along the upper back neck and upper arms.  The patient relates a history of lipoma excision and 2014.  Pathology results revealed a 15 x 15 cm lipoma with no unusual characteristics.  She states that she has had progressive increase in the size of the fat along the upper back and neck distribution since then though she is a little unclear as to exactly when it started after the lipoma excision.  She states that she has been seen at 2 other facilities who have not treated her.  She has an unusual past medical history that includes cirrhosis hepatic failure kidney failure all of which have seemingly resolved. Her most recent AST and ALT as well as her total bilirubin are all within normal ranges BUN and creatinine are also within normal range.  Review of her chart reveals a 40 pound weight gain over the past year.  Sensation to the office today she has a blood pressure of 175/92.  No Known Allergies  Outpatient Encounter Medications as of 06/28/2022  Medication Sig   Carboxymethylcellul-Glycerin (CLEAR EYES FOR DRY EYES) 1-0.25 % SOLN Place 1 drop into both eyes daily as needed (dry eyes).   citalopram (CELEXA) 20 MG tablet TAKE 1 TABLET BY MOUTH EVERY DAY   hydrOXYzine (ATARAX) 25 MG tablet Take 25 mg by mouth 3 (three) times daily as needed.   Vitamin D, Ergocalciferol, (DRISDOL) 1.25 MG (50000 UT) CAPS capsule Take 1 capsule (50,000 Units total) by mouth every 7 (seven) days.   [DISCONTINUED] furosemide (LASIX) 40 MG tablet Take 40 mg by mouth daily.   [DISCONTINUED] lactulose (CHRONULAC) 10 GM/15ML solution Take 15 mLs (10 g total) by mouth 2 (two) times daily.   [DISCONTINUED] loratadine  (CLARITIN) 10 MG tablet Take 1 tablet (10 mg total) by mouth daily.   [DISCONTINUED] meloxicam (MOBIC) 15 MG tablet Take 1 tablet (15 mg total) by mouth daily.   [DISCONTINUED] methocarbamol (ROBAXIN) 500 MG tablet Take 1 tablet (500 mg total) by mouth every 8 (eight) hours as needed for muscle spasms.   [DISCONTINUED] pantoprazole (PROTONIX) 40 MG tablet Take 40 mg by mouth daily.   [DISCONTINUED] polyethylene glycol-electrolytes (NULYTELY) 420 g solution See admin instructions.   [DISCONTINUED] predniSONE (DELTASONE) 10 MG tablet Take 1 tablet (10 mg total) by mouth daily.   [DISCONTINUED] solifenacin (VESICARE) 5 MG tablet Take 1 tablet (5 mg total) by mouth daily.   [DISCONTINUED] spironolactone (ALDACTONE) 100 MG tablet Take 100 mg by mouth daily.   No facility-administered encounter medications on file as of 06/28/2022.     Past Medical History:  Diagnosis Date   Anxiety    DISC DISEASE, CERVICAL 07/13/2009   Edema 03/22/2011   HEMATOCHEZIA 11/30/2009   HYPERLIPIDEMIA 07/18/2009   HYPERTENSION 07/12/2009   Irregular menses 03/22/2011   NECK PAIN 07/12/2009   OVERACTIVE BLADDER 07/12/2009   RENAL INSUFFICIENCY 07/12/2009   SINUSITIS- ACUTE-NOS 07/12/2009   Sleep apnea    prescribed cpap but has not gotten   TRANSAMINASES, SERUM, ELEVATED 07/12/2009   Trichomonal vaginitis 01/12/2022    Past Surgical History:  Procedure Laterality Date   COLONOSCOPY WITH PROPOFOL N/A 01/11/2021   Procedure:  COLONOSCOPY WITH PROPOFOL;  Surgeon: [name], MD;  Location: [facility];  Service: Gastroenterology;  Laterality: N/A;   ESOPHAGOGASTRODUODENOSCOPY (EGD) WITH PROPOFOL N/A 01/11/2021   Procedure: ESOPHAGOGASTRODUODENOSCOPY (EGD) WITH PROPOFOL;  Surgeon: [name], MD;  Location: [facility];  Service: Gastroenterology;  Laterality: N/A;   MASS EXCISION N/A 08/27/2012   Procedure: EXCISION MASS UPPER BACK;  Surgeon: Joyice Faster. Cornett, MD;  Location: [facility];  Service: General;  Laterality: N/A;    POLYPECTOMY  01/11/2021   Procedure: POLYPECTOMY;  Surgeon: [name], MD;  Location: [facility];  Service: Gastroenterology;;   s/p ganglion cyst Right 1980   TUBAL LIGATION  1990    Family History  Problem Relation Age of Onset   Hypertension Mother    Kidney failure Mother    Diabetes Maternal Aunt    Colon cancer Neg Hx     Social History   Social History Narrative   Not on file     Review of Systems General: Denies fevers, chills, weight loss CV: Denies chest pain, shortness of breath, palpitations Skin: Patient complains of increasing fat mass in the upper back neck upper arms and chest.  This is uncomfortable and has begun to make wearing close difficult.  Physical Exam    06/28/2022   10:18 AM 06/28/2022    9:52 AM 05/26/2022   10:36 AM  Vitals with BMI  Height  '5\' 5"'$    Weight  196 lbs 10 oz   BMI  01.65   Systolic 537 482 707  Diastolic 867 92 90  Pulse 96 102 72    General:  No acute distress,  Alert and oriented, Non-Toxic, Normal speech and affect Skin: Patient has significant fat hypertrophy in the area as described.  There is no discrete mass and no clear originating point for her fat hypertrophy.  The fat is soft and homogeneous in consistency.  Mammogram: Mammogram in October 2023 was BI-RADS 1 Assessment/Plan Fat hypertrophy: Patient has a very unusual pattern of fat hypertrophy across the upper back neck upper arms and chest.  I do not have an etiology for this hypertrophy nor do I have a solution for treatment.  Given the distribution of the fat hypertrophy as well as the significant weight gain over the past year and the hypertension I will order an a.m. cortisol.  I will also discuss the case with Dr. Marla Roe to see if she has any suggestions.  Lori Oconnell will follow-up with me in 2 weeks.  Lori Oconnell 06/28/2022, 10:22 AM

## 2022-06-28 NOTE — Telephone Encounter (Signed)
Called and spoke with the patient regarding the message below.  Asked  the patient where does she get her blood work drawn.  Patient stated that she gets her labs drawn at her PCP office.    Informed the patient she can give her PCP office a call and ask to speak with someone in the lab, and they should be able to give her instruction on what she will need to do before having the Cortisol level drawn.    Patient verbalized understanding and agreed.//AB/CMA

## 2022-06-28 NOTE — Telephone Encounter (Addendum)
-----  Message from Camillia Herter, MD sent at 06/28/2022 10:37 AM EST ----- Regarding: Labs Ladies,  Will one of you please contact Ms Kedzierski and tell her I ordered a cortisol lab test for her. It will need to be done in the early morning, possibly before she eats- she should check with the lab about that.  Thank you.  Barnabas Lister

## 2022-06-29 ENCOUNTER — Telehealth: Payer: Self-pay | Admitting: Internal Medicine

## 2022-06-29 DIAGNOSIS — E65 Localized adiposity: Secondary | ICD-10-CM

## 2022-06-29 DIAGNOSIS — I10 Essential (primary) hypertension: Secondary | ICD-10-CM

## 2022-06-29 NOTE — Telephone Encounter (Signed)
Ok for labs but need to clarify which exact labs and the reason for the labs (ICD-10 codes)

## 2022-06-29 NOTE — Telephone Encounter (Signed)
Called pt and got disconnected. Tried calling pt again receive vm LMOM RTC.Marland KitchenJohny Oconnell

## 2022-06-29 NOTE — Telephone Encounter (Signed)
Pt called back.. She states she need Cortisol test. Dr. 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

## 2022-06-29 NOTE — Telephone Encounter (Signed)
Patient called and states that she saw her plastic surgeon yesterday and he wants lab work done.  Can Dr. Jenny Reichmann order the  [facility] labs.  Please call patient and let her know when these orders have been placed.  Patients number:  [phone]

## 2022-06-30 ENCOUNTER — Other Ambulatory Visit: Payer: Self-pay

## 2022-06-30 ENCOUNTER — Other Ambulatory Visit (INDEPENDENT_AMBULATORY_CARE_PROVIDER_SITE_OTHER): Payer: 59

## 2022-06-30 DIAGNOSIS — I10 Essential (primary) hypertension: Secondary | ICD-10-CM

## 2022-06-30 DIAGNOSIS — E65 Localized adiposity: Secondary | ICD-10-CM | POA: Diagnosis not present

## 2022-06-30 LAB — CORTISOL: Cortisol, Plasma: 11.6 ug/dL

## 2022-06-30 NOTE — Addendum Note (Signed)
Addended by: Max Sane on: 06/30/2022 07:59 AM   Modules accepted: Orders

## 2022-07-06 ENCOUNTER — Encounter: Payer: Self-pay | Admitting: Plastic Surgery

## 2022-07-06 ENCOUNTER — Ambulatory Visit (INDEPENDENT_AMBULATORY_CARE_PROVIDER_SITE_OTHER): Payer: 59 | Admitting: Plastic Surgery

## 2022-07-06 DIAGNOSIS — E8889 Other specified metabolic disorders: Secondary | ICD-10-CM | POA: Diagnosis not present

## 2022-07-06 NOTE — Progress Notes (Signed)
Lori Oconnell returns today for follow-up after evaluation for bilateral symmetric lipohypertrophy.  Her cortisol level was normal which was reassuring.  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  There is no surgical intervention for this disease.  I have asked her to discussed this with her primary care doctor.  She is welcome to return if there is something I can do for her in the future.

## 2022-07-12 DIAGNOSIS — M25512 Pain in left shoulder: Secondary | ICD-10-CM | POA: Diagnosis not present

## 2022-08-01 ENCOUNTER — Other Ambulatory Visit: Payer: Self-pay | Admitting: Gastroenterology

## 2022-08-01 DIAGNOSIS — K7030 Alcoholic cirrhosis of liver without ascites: Secondary | ICD-10-CM

## 2022-08-30 ENCOUNTER — Ambulatory Visit
Admission: RE | Admit: 2022-08-30 | Discharge: 2022-08-30 | Disposition: A | Discharge status: Home / Self Care | Payer: 59 | Source: Ambulatory Visit | Attending: Gastroenterology | Admitting: Gastroenterology

## 2022-08-30 DIAGNOSIS — K7460 Unspecified cirrhosis of liver: Secondary | ICD-10-CM | POA: Diagnosis not present

## 2022-08-30 DIAGNOSIS — K7030 Alcoholic cirrhosis of liver without ascites: Secondary | ICD-10-CM

## 2022-08-30 DIAGNOSIS — K8020 Calculus of gallbladder without cholecystitis without obstruction: Secondary | ICD-10-CM | POA: Diagnosis not present

## 2022-11-10 ENCOUNTER — Other Ambulatory Visit: Payer: Self-pay

## 2022-11-10 ENCOUNTER — Other Ambulatory Visit: Payer: Self-pay | Admitting: Internal Medicine

## 2022-11-10 MED ORDER — PANTOPRAZOLE SODIUM 40 MG PO TBEC: 40.0000 mg | DELAYED_RELEASE_TABLET | Freq: Every day | ORAL | 0 refills | Status: DC

## 2022-11-22 ENCOUNTER — Encounter: Payer: Self-pay | Admitting: Internal Medicine

## 2022-11-22 ENCOUNTER — Ambulatory Visit (INDEPENDENT_AMBULATORY_CARE_PROVIDER_SITE_OTHER): Payer: 59 | Admitting: Internal Medicine

## 2022-11-22 VITALS — BP 160/100 | HR 95 | Temp 99.4°F | Ht 65.0 in | Wt 205.0 lb

## 2022-11-22 DIAGNOSIS — I10 Essential (primary) hypertension: Secondary | ICD-10-CM | POA: Diagnosis not present

## 2022-11-22 DIAGNOSIS — E881 Lipodystrophy, not elsewhere classified: Secondary | ICD-10-CM | POA: Diagnosis not present

## 2022-11-22 DIAGNOSIS — E538 Deficiency of other specified B group vitamins: Secondary | ICD-10-CM | POA: Diagnosis not present

## 2022-11-22 DIAGNOSIS — R739 Hyperglycemia, unspecified: Secondary | ICD-10-CM

## 2022-11-22 DIAGNOSIS — F17200 Nicotine dependence, unspecified, uncomplicated: Secondary | ICD-10-CM

## 2022-11-22 DIAGNOSIS — E8889 Other specified metabolic disorders: Secondary | ICD-10-CM | POA: Diagnosis not present

## 2022-11-22 DIAGNOSIS — E559 Vitamin D deficiency, unspecified: Secondary | ICD-10-CM

## 2022-11-22 DIAGNOSIS — E7800 Pure hypercholesterolemia, unspecified: Secondary | ICD-10-CM | POA: Diagnosis not present

## 2022-11-22 DIAGNOSIS — Z0001 Encounter for general adult medical examination with abnormal findings: Secondary | ICD-10-CM

## 2022-11-22 DIAGNOSIS — Z0000 Encounter for general adult medical examination without abnormal findings: Secondary | ICD-10-CM

## 2022-11-22 MED ORDER — AMLODIPINE BESYLATE 10 MG PO TABS: 10.0000 mg | ORAL_TABLET | Freq: Every day | ORAL | 3 refills | Status: DC

## 2022-11-22 NOTE — Patient Instructions (Signed)
Please take all new medication as prescribed - the amlodipine 10 mg per day  Please continue all other medications as before, and refills have been done if requested.  Please have the pharmacy call with any other refills you may need.  Please continue your efforts at being more active, low cholesterol diet, and weight control.  You are otherwise up to date with prevention measures today.  Please keep your appointments with your specialists as you may have planned  You will be contacted regarding the referral for: Plastic Surgury - [facility], and Renal artery ultrasound  Please make an Appointment to return in 6 months, or sooner if needed, also with Lab Appointment for testing done 3-5 days before at the FIRST FLOOR Lab (so this is for TWO appointments - please see the scheduling desk as you leave)

## 2022-11-22 NOTE — Assessment & Plan Note (Signed)
Last vitamin D Lab Results  Component Value Date   VD25OH 45.14 05/23/2022   Stable, cont oral replacement

## 2022-11-22 NOTE — Assessment & Plan Note (Signed)
Age and sex appropriate education and counseling updated with regular exercise and diet Referrals for preventative services - none needed Immunizations addressed - for tdap and shingrix at pharmacy, declines covid booster Smoking counseling  - pt counsled to quit, pt not ready Evidence for depression or other mood disorder - stable depression anxeity Most recent labs reviewed. I have personally reviewed and have noted: 1) the patient's medical and social history 2) The patient's current medications and supplements 3) The patient's height, weight, and BMI have been recorded in the chart

## 2022-11-22 NOTE — Assessment & Plan Note (Signed)
Worsening to neck upper back and upper arms, right upper leg - for referral plastic surgury [facility]

## 2022-11-22 NOTE — Assessment & Plan Note (Signed)
Pt counsled to quit, pt not ready °

## 2022-11-22 NOTE — Assessment & Plan Note (Signed)
BP Readings from Last 3 Encounters:  11/22/22 (!) 160/100  07/06/22 (!) 148/96  06/28/22 (!) 184/101   Uncontrolled, to check renal artery u/s r/o RAS, add amlodipine 10 qd

## 2022-11-22 NOTE — Assessment & Plan Note (Signed)
Lab Results  Component Value Date   LDLCALC 107 (H) 05/23/2022   uncontrolled, pt for lower chol diet, declines statin for now

## 2022-11-22 NOTE — Progress Notes (Signed)
Patient ID: Lori Oconnell, female   DOB: 1961-04-14, 62 y.o.   MRN: [id number]         Chief Complaint:: wellness exam and worsening lipodystrophy, htn, hld, smoker, low vit d, hyperglycemia       HPI:  Lori Oconnell is a 62 y.o. female here for wellness exam; for tdap and shingrix at pharmacy, declines covid booster, o/w up to date. Still smoking, not ready to quit.                         Also has worsening lipodystrophy, not felt to need surgical intervention per local plastic surgury, pt asks for second opinion.  BP has been increasing at home.  Pt denies chest pain, increased sob or doe, wheezing, orthopnea, PND, increased LE swelling, palpitations, dizziness or syncope.   Pt denies polydipsia, polyuria, or new focal neuro s/s.    Pt denies fever, wt loss, night sweats, loss of appetite, or other constitutional symptoms     Wt Readings from Last 3 Encounters:  11/22/22 205 lb (93 kg)  06/28/22 196 lb 9.6 oz (89.2 kg)  05/23/22 191 lb (86.6 kg)   BP Readings from Last 3 Encounters:  11/22/22 (!) 160/100  07/06/22 (!) 148/96  06/28/22 (!) 184/101   Immunization History  Administered Date(s) Administered   Influenza Inj Mdck Quad Pf 02/15/2022   Influenza Split 03/22/2011   Influenza Whole 07/12/2009   Influenza, Quadrivalent, Recombinant, Inj, Pf 05/05/2019   Influenza,inj,Quad PF,6+ Mos 07/02/2013, 05/26/2016, 05/09/2017, 05/15/2018   PFIZER(Purple Top)SARS-COV-2 Vaccination 09/11/2019, 10/10/2019   PNEUMOCOCCAL CONJUGATE-20 05/18/2021   Td 07/12/2009   Zoster Recombinat (Shingrix) 06/12/2019   Health Maintenance Due  Topic Date Due   Medicare Annual Wellness (AWV)  05/29/2019   DTaP/Tdap/Td (2 - Tdap) 07/13/2019   Zoster Vaccines- Shingrix (2 of 2) 08/07/2019      Past Medical History:  Diagnosis Date   Anxiety    DISC DISEASE, CERVICAL 07/13/2009   Edema 03/22/2011   HEMATOCHEZIA 11/30/2009   HYPERLIPIDEMIA 07/18/2009   HYPERTENSION 07/12/2009   Irregular menses 03/22/2011    NECK PAIN 07/12/2009   OVERACTIVE BLADDER 07/12/2009   RENAL INSUFFICIENCY 07/12/2009   SINUSITIS- ACUTE-NOS 07/12/2009   Sleep apnea    prescribed cpap but has not gotten   TRANSAMINASES, SERUM, ELEVATED 07/12/2009   Trichomonal vaginitis 01/12/2022   Past Surgical History:  Procedure Laterality Date   COLONOSCOPY WITH PROPOFOL N/A 01/11/2021   Procedure: COLONOSCOPY WITH PROPOFOL;  Surgeon: Kerin Salen, MD;  Location: [facility];  Service: Gastroenterology;  Laterality: N/A;   ESOPHAGOGASTRODUODENOSCOPY (EGD) WITH PROPOFOL N/A 01/11/2021   Procedure: ESOPHAGOGASTRODUODENOSCOPY (EGD) WITH PROPOFOL;  Surgeon: Kerin Salen, MD;  Location: [facility];  Service: Gastroenterology;  Laterality: N/A;   MASS EXCISION N/A 08/27/2012   Procedure: EXCISION MASS UPPER BACK;  Surgeon: Clovis Pu. Cornett, MD;  Location: [facility];  Service: General;  Laterality: N/A;   POLYPECTOMY  01/11/2021   Procedure: POLYPECTOMY;  Surgeon: Kerin Salen, MD;  Location: [facility];  Service: Gastroenterology;;   s/p ganglion cyst Right 1980   TUBAL LIGATION  1990    reports that she has been smoking cigarettes. She has a 1.25 pack-year smoking history. She has quit using smokeless tobacco.  Her smokeless tobacco use included chew. She reports that she does not currently use alcohol after a past usage of about 3.0 standard drinks of alcohol per week. She reports that she does not use drugs. family history includes  Diabetes in her maternal aunt; Hypertension in her mother; Kidney failure in her mother. No Known Allergies Current Outpatient Medications on File Prior to Visit  Medication Sig Dispense Refill   Carboxymethylcellul-Glycerin (CLEAR EYES FOR DRY EYES) 1-0.25 % SOLN Place 1 drop into both eyes daily as needed (dry eyes).     citalopram (CELEXA) 20 MG tablet TAKE 1 TABLET BY MOUTH EVERY DAY 90 tablet 3   hydrOXYzine (ATARAX) 25 MG tablet Take 25 mg by mouth 3 (three) times daily as needed.     pantoprazole (PROTONIX)  40 MG tablet Take 1 tablet (40 mg total) by mouth daily. 90 tablet 0   Vitamin D, Ergocalciferol, (DRISDOL) 1.25 MG (50000 UT) CAPS capsule Take 1 capsule (50,000 Units total) by mouth every 7 (seven) days. 12 capsule 0   No current facility-administered medications on file prior to visit.        ROS:  All others reviewed and negative.  Objective        PE:  BP (!) 160/100 (BP Location: Left Arm, Patient Position: Sitting, Cuff Size: Normal)   Pulse 95   Temp 99.4 F (37.4 C) (Oral)   Ht 5\' 5"  (1.651 m)   Wt 205 lb (93 kg)   LMP 03/19/2011   SpO2 99%   BMI 34.11 kg/m                 Constitutional: Pt appears in NAD               HENT: Head: NCAT.                Right Ear: External ear normal.                 Left Ear: External ear normal.                Eyes: . Pupils are equal, round, and reactive to light. Conjunctivae and EOM are normal               Nose: without d/c or deformity               Neck: Neck supple. Gross normal ROM               Cardiovascular: Normal rate and regular rhythm.                 Pulmonary/Chest: Effort normal and breath sounds without rales or wheezing.                Abd:  Soft, NT, ND, + BS, no organomegaly               Neurological: Pt is alert. At baseline orientation, motor grossly intact               Skin: Skin is warm. No rashes, no other new lesions, LE edema - none               Psychiatric: Pt behavior is normal without agitation   Micro: none  Cardiac tracings I have personally interpreted today:  none  Pertinent Radiological findings (summarize): none   Lab Results  Component Value Date   WBC 6.2 05/23/2022   HGB 12.4 05/23/2022   HCT 36.2 05/23/2022   PLT 193.0 05/23/2022   GLUCOSE 84 05/23/2022   CHOL 203 (H) 05/23/2022   TRIG 131.0 05/23/2022   HDL 70.00 05/23/2022   LDLDIRECT 119.0 11/04/2015   LDLCALC 107 (H) 05/23/2022   ALT  9 05/23/2022   AST 15 05/23/2022   NA 140 05/23/2022   K 3.9 05/23/2022   CL 104  05/23/2022   CREATININE 0.82 05/23/2022   BUN 15 05/23/2022   CO2 30 05/23/2022   TSH 1.43 05/23/2022   INR 1.9 (H) 10/27/2020   HGBA1C 5.9 05/23/2022   Assessment/Plan:  Lori Oconnell is a 62 y.o. Black or African American [2] female with  has a past medical history of Anxiety, DISC DISEASE, CERVICAL (07/13/2009), Edema (03/22/2011), HEMATOCHEZIA (11/30/2009), HYPERLIPIDEMIA (07/18/2009), HYPERTENSION (07/12/2009), Irregular menses (03/22/2011), NECK PAIN (07/12/2009), OVERACTIVE BLADDER (07/12/2009), RENAL INSUFFICIENCY (07/12/2009), SINUSITIS- ACUTE-NOS (07/12/2009), Sleep apnea, TRANSAMINASES, SERUM, ELEVATED (07/12/2009), and Trichomonal vaginitis (01/12/2022).  Encounter for well adult exam with abnormal findings Age and sex appropriate education and counseling updated with regular exercise and diet Referrals for preventative services - none needed Immunizations addressed - for tdap and shingrix at pharmacy, declines covid booster Smoking counseling  - pt counsled to quit, pt not ready Evidence for depression or other mood disorder - stable depression anxeity Most recent labs reviewed. 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 and upper arms, right upper leg - for referral plastic surgury [facility]  Hyperlipidemia Lab Results  Component Value Date   LDLCALC 107 (H) 05/23/2022   uncontrolled, pt for lower chol diet, declines statin for now   Essential hypertension BP Readings from Last 3 Encounters:  11/22/22 (!) 160/100  07/06/22 (!) 148/96  06/28/22 (!) 184/101   Uncontrolled, to check renal artery u/s r/o RAS, add amlodipine 10 qd   Smoker Pt counsled to quit, pt not ready  Vitamin D deficiency Last vitamin D Lab Results  Component Value Date   VD25OH  45.14 05/23/2022   Stable, cont oral replacement   Hyperglycemia Lab Results  Component Value Date   HGBA1C 5.9 05/23/2022   Stable, pt to continue current medical treatment  - diet, wt control  Followup: Return in about 6 months (around 05/24/2023).  Oliver Barre, MD 11/22/2022 8:48 PM [facility]

## 2022-11-22 NOTE — Assessment & Plan Note (Signed)
Lab Results  Component Value Date   HGBA1C 5.9 05/23/2022   Stable, pt to continue current medical treatment  - diet, wt control

## 2022-12-11 ENCOUNTER — Ambulatory Visit (HOSPITAL_COMMUNITY)
Admission: RE | Admit: 2022-12-11 | Discharge: 2022-12-11 | Disposition: A | Discharge status: Home / Self Care | Payer: 59 | Source: Ambulatory Visit | Attending: Cardiology | Admitting: Cardiology

## 2022-12-11 DIAGNOSIS — I10 Essential (primary) hypertension: Secondary | ICD-10-CM | POA: Diagnosis not present

## 2022-12-12 NOTE — Progress Notes (Signed)
The test results show that your current treatment is OK, as the tests are stable.  Please continue the same plan.  There is no other need for change of treatment or further evaluation based on these results, at this time.  thanks 

## 2022-12-22 IMAGING — DX DG CHEST 1V PORT
1 series · 1 of 1 positions shown · non-contrast
Comparison: August 21, 2012

CLINICAL DATA: Generalized weakness.

EXAM:
PORTABLE CHEST 1 VIEW

[chest ap]
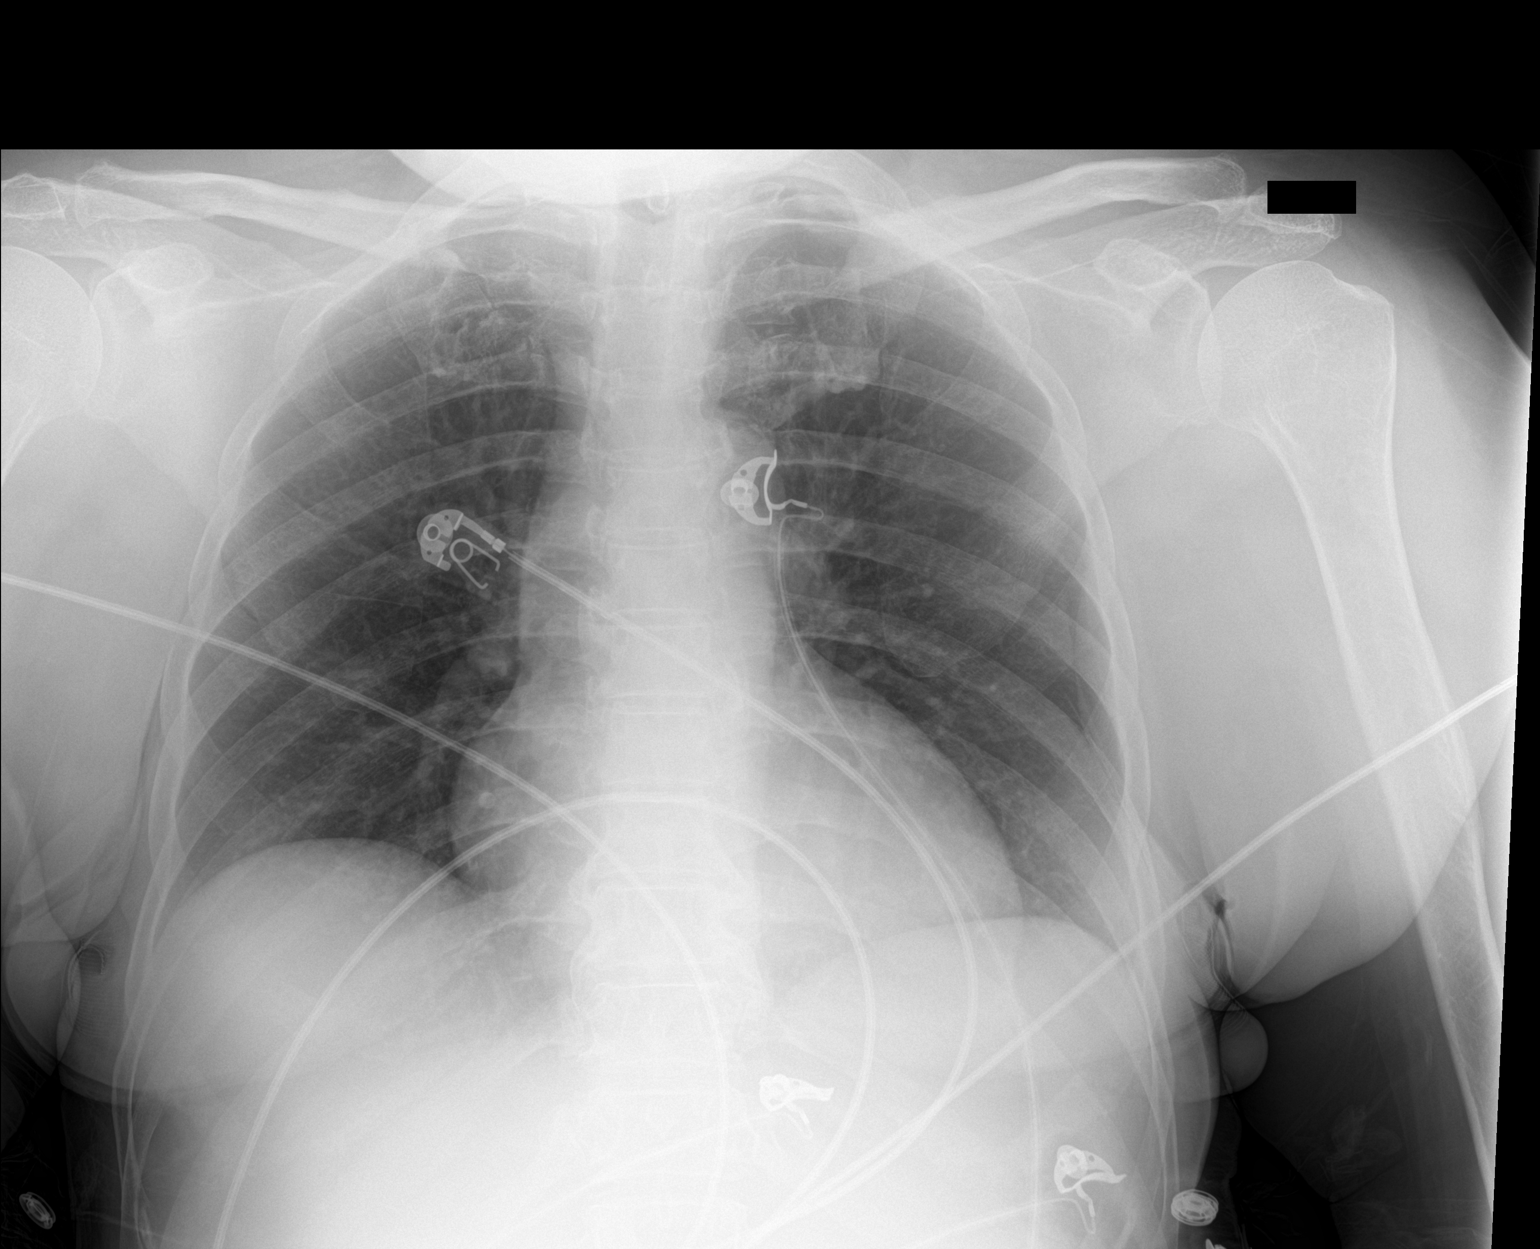

[1 of 1 positions shown; findings below may reference images not displayed]

FINDINGS: The heart size and mediastinal contours are within normal limits.
Both lungs are clear. The visualized skeletal structures are
unremarkable.
IMPRESSION: No active disease.

## 2022-12-28 ENCOUNTER — Telehealth: Payer: Self-pay | Admitting: Internal Medicine

## 2022-12-28 DIAGNOSIS — E881 Lipodystrophy, not elsewhere classified: Secondary | ICD-10-CM

## 2022-12-28 NOTE — Telephone Encounter (Signed)
Called pt she states she saw MD last month and he suppose to send referral to [facility] for plastic surgeon. The referral that was put in was for [facility]. Requesting new referral to [facility]. Also want to let MD know she stop taking the Amlodipine med was causing her to swell.Marland KitchenRaechel Chute

## 2022-12-28 NOTE — Telephone Encounter (Signed)
Ok referral is done  [facility] to continue to monitor [facility] [employer] call if persistently > 140/90

## 2022-12-28 NOTE — Telephone Encounter (Signed)
Patient called and states that she has not seen or heard anything from [facility] about her referral that Dr. [name] was supposed to have made.  That was in June.  Please contact patient at:  [phone]

## 2022-12-29 NOTE — Telephone Encounter (Signed)
Called pt no answer LMOM w/MD response../lmb 

## 2023-01-17 ENCOUNTER — Telehealth: Payer: Self-pay | Admitting: Internal Medicine

## 2023-01-17 ENCOUNTER — Other Ambulatory Visit: Payer: Self-pay | Admitting: Internal Medicine

## 2023-01-17 NOTE — Telephone Encounter (Signed)
Pt husband called for pt stating the pt is very depress because Dr. [name] suppose to be sending a referral to a specialist and her husband can not tell me the name of the specialist. Husband stated something about swelling as well. Someone please call pt back.  Best call back number is [phone]

## 2023-01-17 NOTE — Telephone Encounter (Signed)
Called husband inform him referral was placed on 12/28/22 to Dr. Katheran Awe @ [facility]. Gave him the # to call to check status,,,/lmb

## 2023-01-30 NOTE — Progress Notes (Signed)
This encounter was created in error - please disregard.

## 2023-01-31 DIAGNOSIS — E8889 Other specified metabolic disorders: Secondary | ICD-10-CM | POA: Diagnosis not present

## 2023-02-05 ENCOUNTER — Other Ambulatory Visit: Payer: Self-pay | Admitting: Obstetrics and Gynecology

## 2023-02-05 DIAGNOSIS — Z1231 Encounter for screening mammogram for malignant neoplasm of breast: Secondary | ICD-10-CM

## 2023-02-07 ENCOUNTER — Other Ambulatory Visit: Payer: Self-pay | Admitting: Internal Medicine

## 2023-02-07 ENCOUNTER — Other Ambulatory Visit: Payer: Self-pay

## 2023-02-26 ENCOUNTER — Other Ambulatory Visit: Payer: Self-pay | Admitting: Internal Medicine

## 2023-03-01 DIAGNOSIS — E8889 Other specified metabolic disorders: Secondary | ICD-10-CM | POA: Diagnosis not present

## 2023-03-01 DIAGNOSIS — I10 Essential (primary) hypertension: Secondary | ICD-10-CM | POA: Diagnosis not present

## 2023-03-01 DIAGNOSIS — Z01818 Encounter for other preprocedural examination: Secondary | ICD-10-CM | POA: Diagnosis not present

## 2023-03-01 DIAGNOSIS — E881 Lipodystrophy, not elsewhere classified: Secondary | ICD-10-CM | POA: Diagnosis not present

## 2023-03-02 DIAGNOSIS — E8889 Other specified metabolic disorders: Secondary | ICD-10-CM | POA: Diagnosis not present

## 2023-03-02 DIAGNOSIS — D171 Benign lipomatous neoplasm of skin and subcutaneous tissue of trunk: Secondary | ICD-10-CM | POA: Diagnosis not present

## 2023-03-02 DIAGNOSIS — I10 Essential (primary) hypertension: Secondary | ICD-10-CM | POA: Diagnosis not present

## 2023-03-02 DIAGNOSIS — Z7901 Long term (current) use of anticoagulants: Secondary | ICD-10-CM | POA: Diagnosis not present

## 2023-03-02 DIAGNOSIS — Z87891 Personal history of nicotine dependence: Secondary | ICD-10-CM | POA: Diagnosis not present

## 2023-03-02 DIAGNOSIS — Z79899 Other long term (current) drug therapy: Secondary | ICD-10-CM | POA: Diagnosis not present

## 2023-03-03 DIAGNOSIS — Z87891 Personal history of nicotine dependence: Secondary | ICD-10-CM | POA: Diagnosis not present

## 2023-03-03 DIAGNOSIS — Z7901 Long term (current) use of anticoagulants: Secondary | ICD-10-CM | POA: Diagnosis not present

## 2023-03-03 DIAGNOSIS — D171 Benign lipomatous neoplasm of skin and subcutaneous tissue of trunk: Secondary | ICD-10-CM | POA: Diagnosis not present

## 2023-03-03 DIAGNOSIS — I10 Essential (primary) hypertension: Secondary | ICD-10-CM | POA: Diagnosis not present

## 2023-03-03 DIAGNOSIS — Z79899 Other long term (current) drug therapy: Secondary | ICD-10-CM | POA: Diagnosis not present

## 2023-03-07 ENCOUNTER — Other Ambulatory Visit: Payer: Self-pay | Admitting: Gastroenterology

## 2023-03-07 DIAGNOSIS — K7031 Alcoholic cirrhosis of liver with ascites: Secondary | ICD-10-CM

## 2023-03-07 DIAGNOSIS — E8889 Other specified metabolic disorders: Secondary | ICD-10-CM | POA: Diagnosis not present

## 2023-03-13 DIAGNOSIS — E8889 Other specified metabolic disorders: Secondary | ICD-10-CM | POA: Diagnosis not present

## 2023-03-16 ENCOUNTER — Ambulatory Visit
Admission: RE | Admit: 2023-03-16 | Discharge: 2023-03-16 | Disposition: A | Discharge status: Home / Self Care | Payer: 59 | Source: Ambulatory Visit | Attending: Gastroenterology

## 2023-03-16 DIAGNOSIS — K7031 Alcoholic cirrhosis of liver with ascites: Secondary | ICD-10-CM

## 2023-03-16 DIAGNOSIS — K8020 Calculus of gallbladder without cholecystitis without obstruction: Secondary | ICD-10-CM | POA: Diagnosis not present

## 2023-03-16 DIAGNOSIS — K7460 Unspecified cirrhosis of liver: Secondary | ICD-10-CM | POA: Diagnosis not present

## 2023-03-20 DIAGNOSIS — L7634 Postprocedural seroma of skin and subcutaneous tissue following other procedure: Secondary | ICD-10-CM | POA: Diagnosis not present

## 2023-03-20 DIAGNOSIS — E8889 Other specified metabolic disorders: Secondary | ICD-10-CM | POA: Diagnosis not present

## 2023-03-22 ENCOUNTER — Ambulatory Visit
Admission: RE | Admit: 2023-03-22 | Discharge: 2023-03-22 | Disposition: A | Discharge status: Home / Self Care | Payer: 59 | Source: Ambulatory Visit | Attending: Obstetrics and Gynecology | Admitting: Obstetrics and Gynecology

## 2023-03-22 DIAGNOSIS — Z1231 Encounter for screening mammogram for malignant neoplasm of breast: Secondary | ICD-10-CM | POA: Diagnosis not present

## 2023-03-23 IMAGING — US US ABDOMEN LIMITED
1 series · 14 of 25 positions shown · non-contrast
Comparison: 10/18/2020, 06/21/2018

CLINICAL DATA: Cirrhosis

EXAM:
ULTRASOUND ABDOMEN LIMITED RIGHT UPPER QUADRANT

[Series 1: us abdomen limited · 0.17mm/px · 14 of 62 slices shown]
[im 1/62]
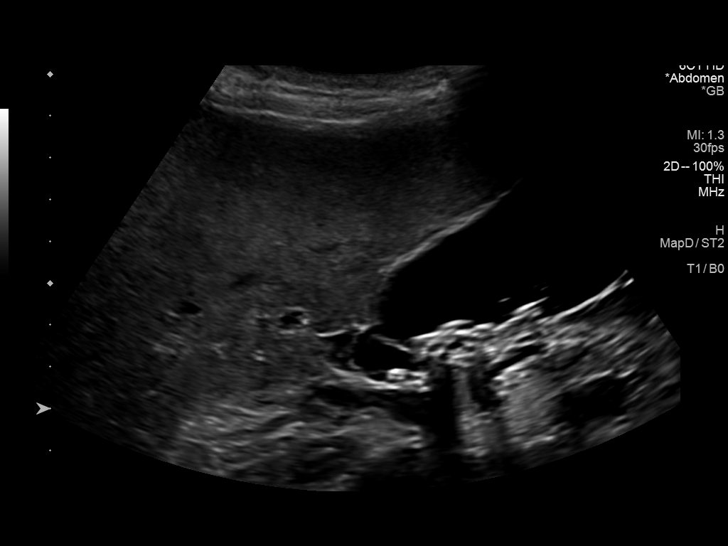
[im 6/62]
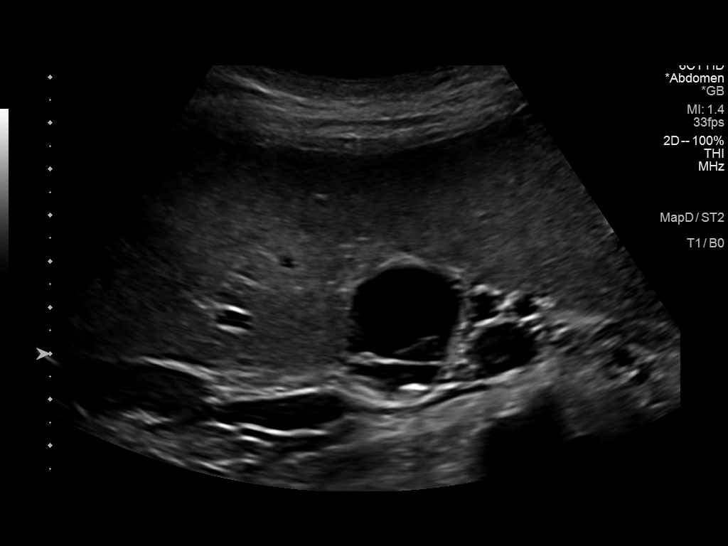
[im 11/62]
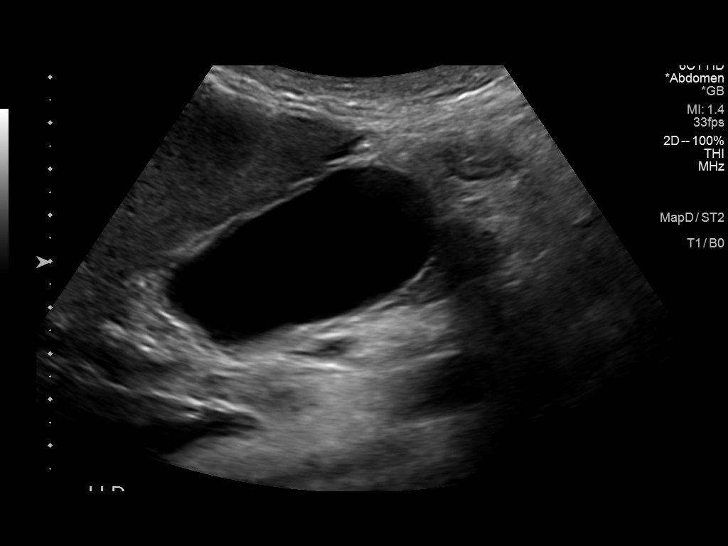
[im 16/62]
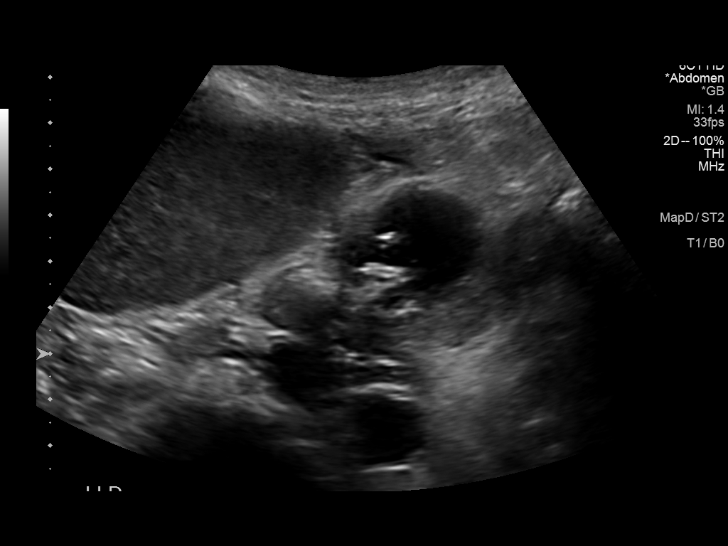
[im 21/62]
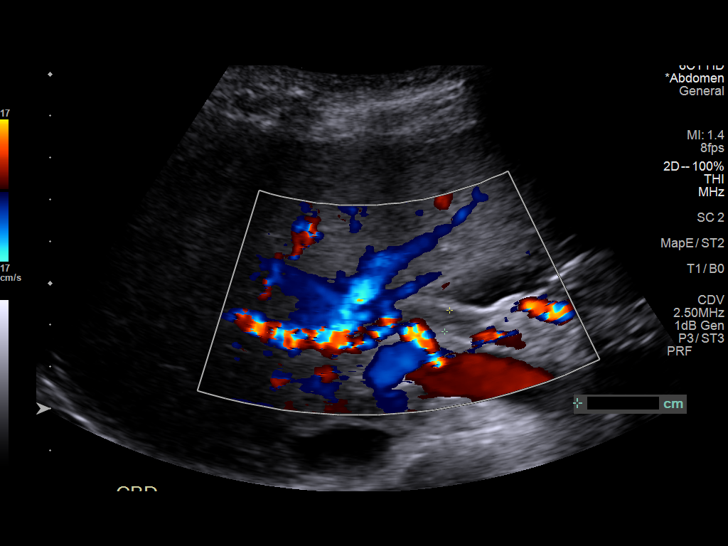
[im 23/62]
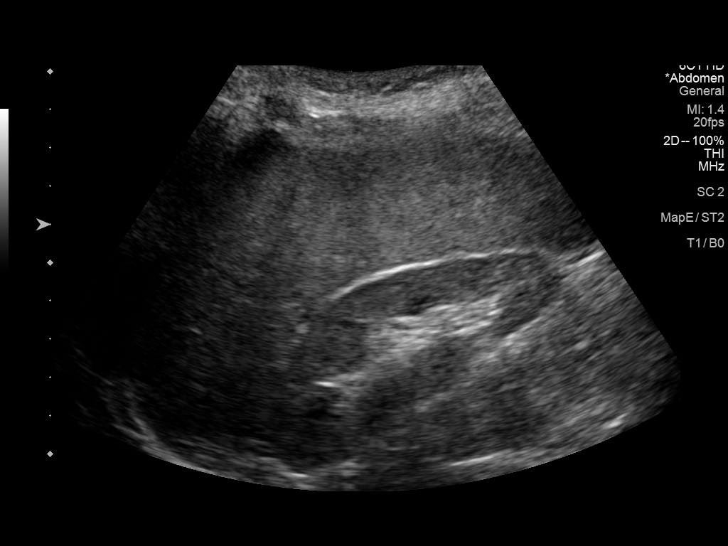
[im 28/62]
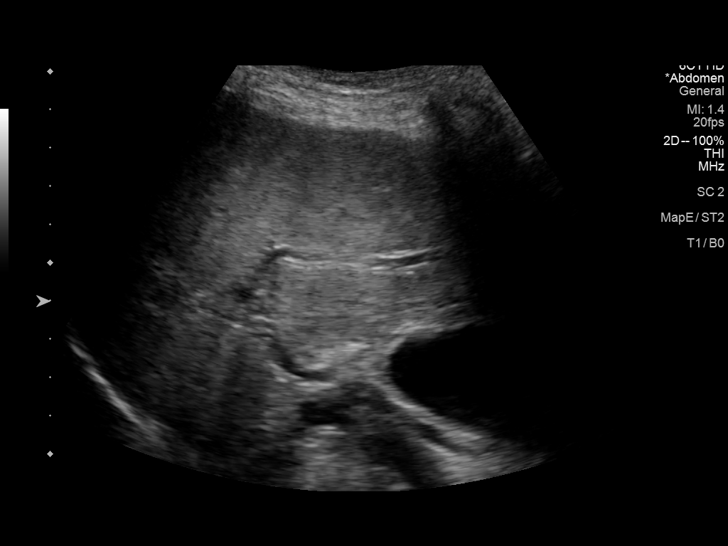
[im 34/62]
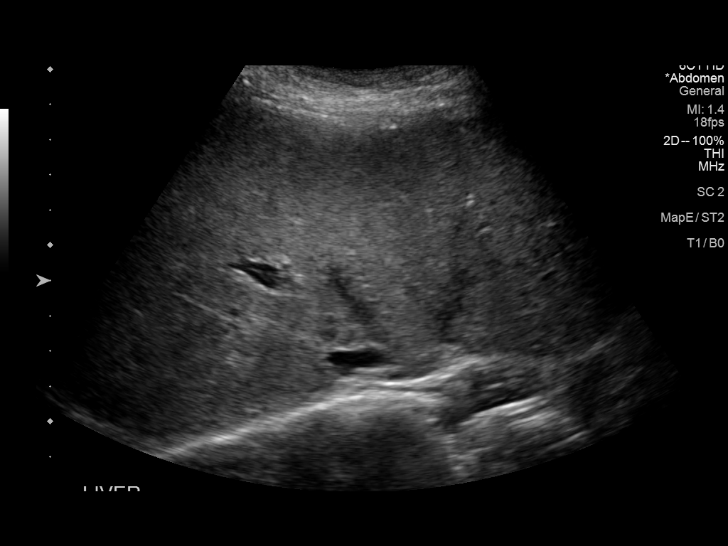
[im 39/62]
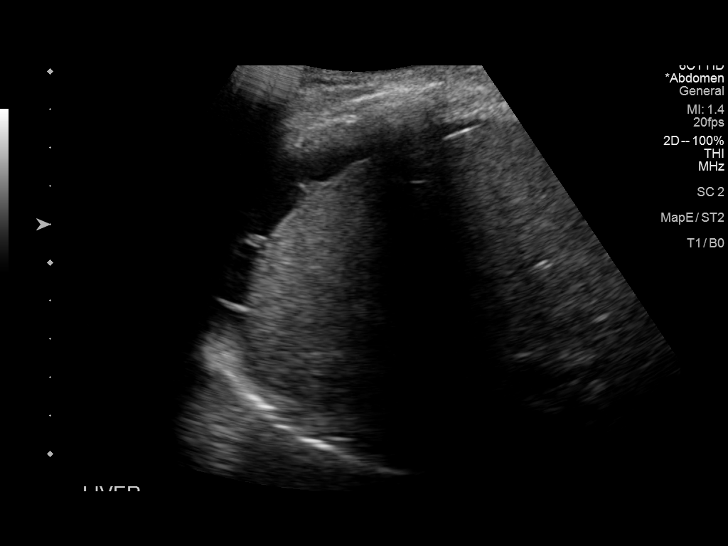
[im 41/62]
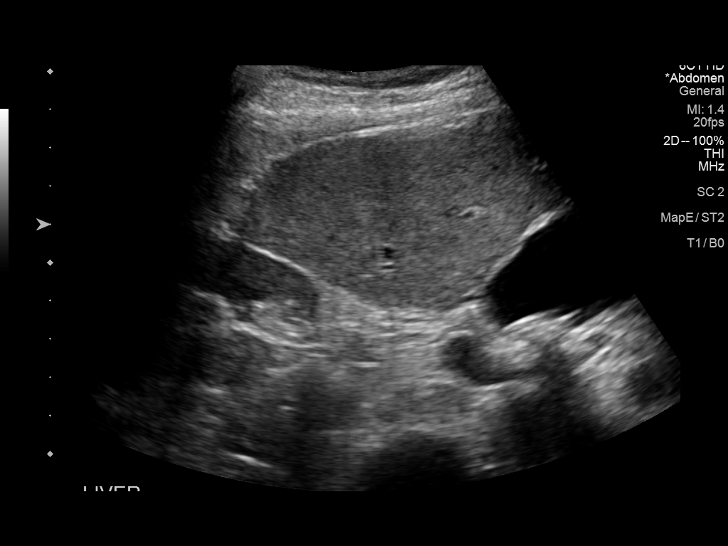
[im 46/62]
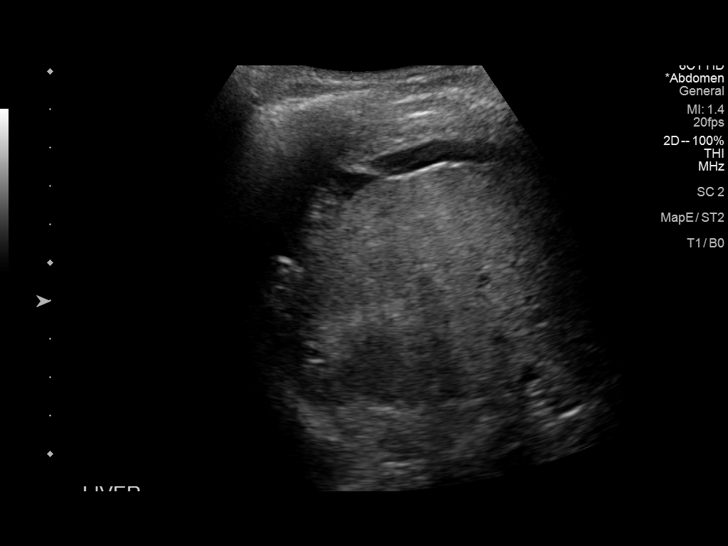
[im 51/62]
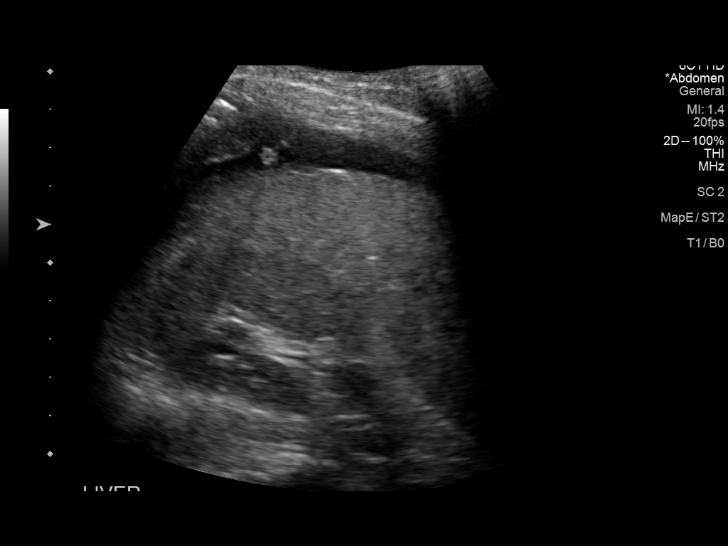
[im 56/62]
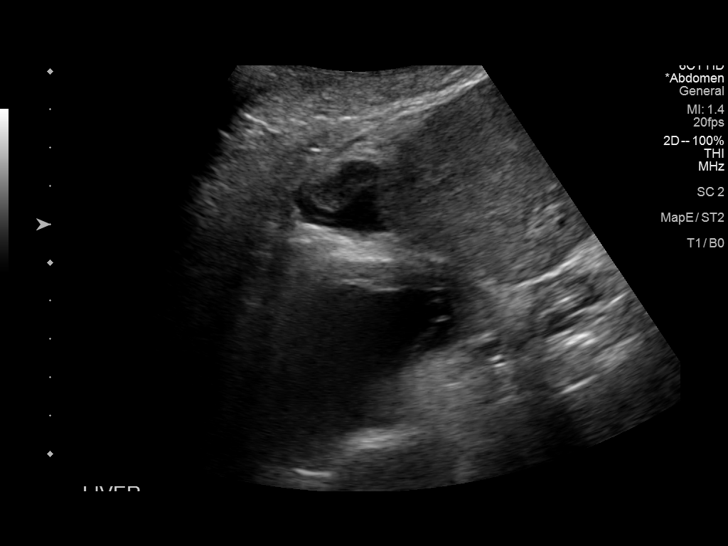
[im 62/62]
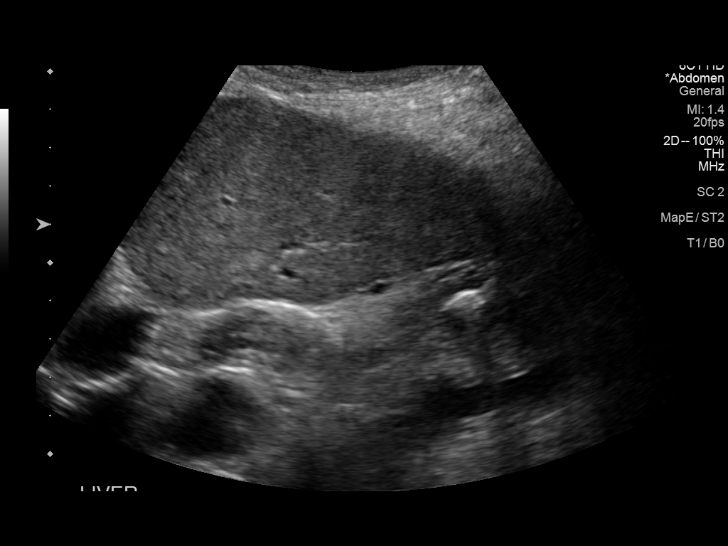

[14 of 25 positions shown; findings below may reference images not displayed]

FINDINGS: Gallbladder:

Multiple mobile shadowing gallstones are seen layering dependently
within the gallbladder, largest measuring 12 mm. No gallbladder wall
thickening. Trace ascites right upper quadrant. Negative sonographic
Murphy sign.

Common bile duct:

Diameter: 5 mm

Liver:

Heterogeneous increased liver echotexture and nodularity of the
liver capsule consistent with cirrhosis and hepatic steatosis seen
on recent CT. No focal liver abnormality. No intrahepatic duct
dilation. Portal vein is patent on color Doppler imaging with normal
direction of blood flow towards the liver.

Other: Trace ascites right upper quadrant.
IMPRESSION: 1. Stable findings of cirrhosis and hepatic steatosis.
2. Cholelithiasis without cholecystitis.
3. Trace right upper quadrant ascites.

## 2023-03-27 DIAGNOSIS — L7634 Postprocedural seroma of skin and subcutaneous tissue following other procedure: Secondary | ICD-10-CM | POA: Diagnosis not present

## 2023-03-27 DIAGNOSIS — Z538 Procedure and treatment not carried out for other reasons: Secondary | ICD-10-CM | POA: Diagnosis not present

## 2023-03-27 DIAGNOSIS — Z79899 Other long term (current) drug therapy: Secondary | ICD-10-CM | POA: Diagnosis not present

## 2023-03-27 DIAGNOSIS — I10 Essential (primary) hypertension: Secondary | ICD-10-CM | POA: Diagnosis not present

## 2023-03-27 DIAGNOSIS — G4730 Sleep apnea, unspecified: Secondary | ICD-10-CM | POA: Diagnosis not present

## 2023-03-27 DIAGNOSIS — Z87891 Personal history of nicotine dependence: Secondary | ICD-10-CM | POA: Diagnosis not present

## 2023-04-06 DIAGNOSIS — E8889 Other specified metabolic disorders: Secondary | ICD-10-CM | POA: Diagnosis not present

## 2023-04-06 DIAGNOSIS — L7634 Postprocedural seroma of skin and subcutaneous tissue following other procedure: Secondary | ICD-10-CM | POA: Diagnosis not present

## 2023-04-24 DIAGNOSIS — L7634 Postprocedural seroma of skin and subcutaneous tissue following other procedure: Secondary | ICD-10-CM | POA: Diagnosis not present

## 2023-04-24 DIAGNOSIS — E8889 Other specified metabolic disorders: Secondary | ICD-10-CM | POA: Diagnosis not present

## 2023-05-15 DIAGNOSIS — E8889 Other specified metabolic disorders: Secondary | ICD-10-CM | POA: Diagnosis not present

## 2023-05-23 ENCOUNTER — Ambulatory Visit: Payer: 59 | Admitting: Internal Medicine

## 2023-05-23 ENCOUNTER — Encounter: Payer: Self-pay | Admitting: Internal Medicine

## 2023-05-23 VITALS — BP 126/74 | HR 79 | Temp 98.3°F | Ht 65.0 in | Wt 204.0 lb

## 2023-05-23 DIAGNOSIS — J309 Allergic rhinitis, unspecified: Secondary | ICD-10-CM | POA: Diagnosis not present

## 2023-05-23 DIAGNOSIS — I10 Essential (primary) hypertension: Secondary | ICD-10-CM

## 2023-05-23 DIAGNOSIS — E881 Lipodystrophy, not elsewhere classified: Secondary | ICD-10-CM | POA: Diagnosis not present

## 2023-05-23 DIAGNOSIS — F17200 Nicotine dependence, unspecified, uncomplicated: Secondary | ICD-10-CM

## 2023-05-23 DIAGNOSIS — E559 Vitamin D deficiency, unspecified: Secondary | ICD-10-CM | POA: Diagnosis not present

## 2023-05-23 DIAGNOSIS — E7800 Pure hypercholesterolemia, unspecified: Secondary | ICD-10-CM | POA: Diagnosis not present

## 2023-05-23 DIAGNOSIS — E538 Deficiency of other specified B group vitamins: Secondary | ICD-10-CM

## 2023-05-23 DIAGNOSIS — R739 Hyperglycemia, unspecified: Secondary | ICD-10-CM

## 2023-05-23 LAB — BASIC METABOLIC PANEL
BUN: 12 mg/dL (ref 6–23)
CO2: 30 meq/L (ref 19–32)
Calcium: 9.2 mg/dL (ref 8.4–10.5)
Chloride: 103 meq/L (ref 96–112)
Creatinine, Ser: 0.8 mg/dL (ref 0.40–1.20)
GFR: 79.96 mL/min (ref >60.00)
Glucose, Bld: 94 mg/dL (ref 70–99)
Potassium: 3.8 meq/L (ref 3.5–5.1)
Sodium: 139 meq/L (ref 135–145)

## 2023-05-23 LAB — CBC WITH DIFFERENTIAL/PLATELET
Basophils Absolute: 0 10*3/uL (ref 0.0–0.1)
Basophils Relative: 0.4 % (ref 0.0–3.0)
Eosinophils Absolute: 0.4 10*3/uL (ref 0.0–0.7)
Eosinophils Relative: 6.7 % — ABNORMAL HIGH (ref 0.0–5.0)
HCT: 34.7 % — ABNORMAL LOW (ref 36.0–46.0)
Hemoglobin: 11.5 g/dL — ABNORMAL LOW (ref 12.0–15.0)
Lymphocytes Relative: 32.2 % (ref 12.0–46.0)
Lymphs Abs: 2.2 10*3/uL (ref 0.7–4.0)
MCHC: 33.3 g/dL (ref 30.0–36.0)
MCV: 95.4 fL (ref 78.0–100.0)
Monocytes Absolute: 0.6 10*3/uL (ref 0.1–1.0)
Monocytes Relative: 8.9 % (ref 3.0–12.0)
Neutro Abs: 3.5 10*3/uL (ref 1.4–7.7)
Neutrophils Relative %: 51.8 % (ref 43.0–77.0)
Platelets: 229 10*3/uL (ref 150.0–400.0)
RBC: 3.63 Mil/uL — ABNORMAL LOW (ref 3.87–5.11)
RDW: 12.6 % (ref 11.5–15.5)
WBC: 6.7 10*3/uL (ref 4.0–10.5)

## 2023-05-23 LAB — HEPATIC FUNCTION PANEL
ALT: 9 U/L (ref 0–35)
AST: 16 U/L (ref 0–37)
Albumin: 3.8 g/dL (ref 3.5–5.2)
Alkaline Phosphatase: 126 U/L — ABNORMAL HIGH (ref 39–117)
Bilirubin, Direct: 0.2 mg/dL (ref 0.0–0.3)
Total Bilirubin: 1 mg/dL (ref 0.2–1.2)
Total Protein: 7.5 g/dL (ref 6.0–8.3)

## 2023-05-23 LAB — LIPID PANEL
Cholesterol: 176 mg/dL (ref 0–200)
HDL: 66.9 mg/dL (ref >39.00)
LDL Cholesterol: 93 mg/dL (ref 0–99)
NonHDL: 108.75
Total CHOL/HDL Ratio: 3
Triglycerides: 81 mg/dL (ref 0.0–149.0)
VLDL: 16.2 mg/dL (ref 0.0–40.0)

## 2023-05-23 LAB — URINALYSIS, ROUTINE W REFLEX MICROSCOPIC
Bilirubin Urine: NEGATIVE
Hgb urine dipstick: NEGATIVE
Ketones, ur: NEGATIVE
Nitrite: NEGATIVE
RBC / HPF: NONE SEEN (ref 0–?)
Specific Gravity, Urine: 1.01 (ref 1.000–1.030)
Total Protein, Urine: NEGATIVE
Urine Glucose: NEGATIVE
Urobilinogen, UA: 0.2 (ref 0.0–1.0)
pH: 6.5 (ref 5.0–8.0)

## 2023-05-23 LAB — VITAMIN B12: Vitamin B-12: 452 pg/mL (ref 211–911)

## 2023-05-23 LAB — VITAMIN D 25 HYDROXY (VIT D DEFICIENCY, FRACTURES): VITD: 30.2 ng/mL (ref 30.00–100.00)

## 2023-05-23 LAB — HEMOGLOBIN A1C: Hgb A1c MFr Bld: 6.3 % (ref 4.6–6.5)

## 2023-05-23 LAB — TSH: TSH: 1.27 u[IU]/mL (ref 0.35–5.50)

## 2023-05-23 NOTE — Progress Notes (Signed)
The test results show that your current treatment is OK, as the tests are stable.  Please continue the same plan.  There is no other need for change of treatment or further evaluation based on these results, at this time.  thanks 

## 2023-05-23 NOTE — Patient Instructions (Signed)

## 2023-05-23 NOTE — Progress Notes (Signed)
Patient ID: Lori Oconnell, female   DOB: 1960/11/30, 62 y.o.   MRN: [id number]        Chief Complaint: follow up HTN, HLD and hyperglycemia, low vit d, smoker,        HPI:  Lori Oconnell is a 62 y.o. female here overall doing well; Pt denies chest pain, increased sob or doe, wheezing, orthopnea, PND, increased LE swelling, palpitations, dizziness or syncope.   Pt denies polydipsia, polyuria, or new focal neuro s/s.    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  Has f/u to consider further soon.  Has gained several lbs recently but plans to restart the gym.  Does have several wks ongoing nasal allergy symptoms with clearish congestion, itch and sneezing, without fever, pain, ST, cough, swelling or wheezing. Wt Readings from Last 3 Encounters:  05/23/23 204 lb (92.5 kg)  11/22/22 205 lb (93 kg)  06/28/22 196 lb 9.6 oz (89.2 kg)   BP Readings from Last 3 Encounters:  05/23/23 126/74  11/22/22 (!) 160/100  07/06/22 (!) 148/96         Past Medical History:  Diagnosis Date   Anxiety    DISC DISEASE, CERVICAL 07/13/2009   Edema 03/22/2011   HEMATOCHEZIA 11/30/2009   HYPERLIPIDEMIA 07/18/2009   HYPERTENSION 07/12/2009   Irregular menses 03/22/2011   NECK PAIN 07/12/2009   OVERACTIVE BLADDER 07/12/2009   RENAL INSUFFICIENCY 07/12/2009   SINUSITIS- ACUTE-NOS 07/12/2009   Sleep apnea    prescribed cpap but has not gotten   TRANSAMINASES, SERUM, ELEVATED 07/12/2009   Trichomonal vaginitis 01/12/2022   Past Surgical History:  Procedure Laterality Date   COLONOSCOPY WITH PROPOFOL N/A 01/11/2021   Procedure: COLONOSCOPY WITH PROPOFOL;  Surgeon: Kerin Salen, MD;  Location: [facility];  Service: Gastroenterology;  Laterality: N/A;   ESOPHAGOGASTRODUODENOSCOPY (EGD) WITH PROPOFOL N/A 01/11/2021   Procedure: ESOPHAGOGASTRODUODENOSCOPY  (EGD) WITH PROPOFOL;  Surgeon: Kerin Salen, MD;  Location: [facility];  Service: Gastroenterology;  Laterality: N/A;   MASS EXCISION N/A 08/27/2012   Procedure: EXCISION MASS UPPER BACK;  Surgeon: Clovis Pu. Cornett, MD;  Location: [facility];  Service: General;  Laterality: N/A;   POLYPECTOMY  01/11/2021   Procedure: POLYPECTOMY;  Surgeon: Kerin Salen, MD;  Location: [facility];  Service: Gastroenterology;;   s/p ganglion cyst Right 1980   TUBAL LIGATION  1990    reports that she has been smoking cigarettes. She has a 1.3 pack-year smoking history. She has quit using smokeless tobacco.  Her smokeless tobacco use included chew. She reports that she does not currently use alcohol after a past usage of about 3.0 standard drinks of alcohol per week. She reports that she does not use drugs. family history includes Diabetes in her maternal aunt; Hypertension in her mother; Kidney failure in her mother. No Known Allergies Current Outpatient Medications on File Prior to Visit  Medication Sig Dispense Refill   amLODipine (NORVASC) 10 MG tablet Take 1 tablet (10 mg total) by mouth daily. 90 tablet 3   citalopram (CELEXA) 20 MG tablet TAKE 1 TABLET BY MOUTH EVERY DAY 90 tablet 2   hydrOXYzine (ATARAX) 25 MG tablet Take 25 mg by mouth 3 (three) times daily as needed.     pantoprazole (PROTONIX) 40 MG tablet TAKE 1 TABLET BY MOUTH EVERY DAY 90 tablet 3   Vitamin D,  Ergocalciferol, (DRISDOL) 1.25 MG (50000 UT) CAPS capsule Take 1 capsule (50,000 Units total) by mouth every 7 (seven) days. 12 capsule 0   No current facility-administered medications on file prior to visit.        ROS:  All others reviewed and negative.  Objective        PE:  BP 126/74 (BP Location: Left Arm, Patient Position: Sitting, Cuff Size: Normal)   Pulse 79   Temp 98.3 F (36.8 C) (Oral)   Ht 5\' 5"  (1.651 m)   Wt 204 lb (92.5 kg)   LMP 03/19/2011   SpO2 99%   BMI 33.95 kg/m                 Constitutional: Pt appears in NAD                HENT: Head: NCAT.                Right Ear: External ear normal.                 Left Ear: External ear normal.                Eyes: . Pupils are equal, round, and reactive to light. Conjunctivae and EOM are normal               Nose: without d/c or deformity               Neck: Neck supple. Gross normal ROM               Cardiovascular: Normal rate and regular rhythm.                 Pulmonary/Chest: Effort normal and breath sounds without rales or wheezing.                Abd:  Soft, NT, ND, + BS, no organomegaly               Neurological: Pt is alert. At baseline orientation, motor grossly intact               Skin: Skin is warm. No rashes, no other new lesions, LE edema - none               Psychiatric: Pt behavior is normal without agitation   Micro: none  Cardiac tracings I have personally interpreted today:  none  Pertinent Radiological findings (summarize): none   Lab Results  Component Value Date   WBC 6.7 05/23/2023   HGB 11.5 (L) 05/23/2023   HCT 34.7 (L) 05/23/2023   PLT 229.0 05/23/2023   GLUCOSE 94 05/23/2023   CHOL 176 05/23/2023   TRIG 81.0 05/23/2023   HDL 66.90 05/23/2023   LDLDIRECT 119.0 11/04/2015   LDLCALC 93 05/23/2023   ALT 9 05/23/2023   AST 16 05/23/2023   NA 139 05/23/2023   K 3.8 05/23/2023   CL 103 05/23/2023   CREATININE 0.80 05/23/2023   BUN 12 05/23/2023   CO2 30 05/23/2023   TSH 1.27 05/23/2023   INR 1.9 (H) 10/27/2020   HGBA1C 6.3 05/23/2023   Assessment/Plan:  Lori Oconnell is a 62 y.o. Black or African American [2] female with  has a past medical history of Anxiety, DISC DISEASE, CERVICAL (07/13/2009), Edema (03/22/2011), HEMATOCHEZIA (11/30/2009), HYPERLIPIDEMIA (07/18/2009), HYPERTENSION (07/12/2009), Irregular menses (03/22/2011), NECK PAIN (07/12/2009), OVERACTIVE BLADDER (07/12/2009), RENAL INSUFFICIENCY (07/12/2009), SINUSITIS- ACUTE-NOS (07/12/2009), Sleep apnea, TRANSAMINASES, SERUM, ELEVATED (07/12/2009), and Trichomonal  vaginitis (  01/12/2022).  Progressive lipodystrophy Improved s/p procedures, for f/u [facility] soon  Vitamin D deficiency Last vitamin D Lab Results  Component Value Date   VD25OH 30.20 05/23/2023   Low, to start oral replacement   Smoker Pt encouraged to quit, pt not ready  Hyperlipidemia Lab Results  Component Value Date   LDLCALC 93 05/23/2023   Stable, pt to continue lower chol diet   Hyperglycemia Lab Results  Component Value Date   HGBA1C 6.3 05/23/2023   Stable, pt to continue current medical treatment  - diet,wt control   Essential hypertension BP Readings from Last 3 Encounters:  05/23/23 126/74  11/22/22 (!) 160/100  07/06/22 (!) 148/96   Stable, pt to continue medical treatment amlodipine 10 qd   Allergic rhinitis Mild to mod, for otc nasacort prn, asd, to f/u any worsening symptoms or concerns  Followup: Return in about 6 months (around 11/21/2023).  Oliver Barre, MD 05/26/2023 4:16 PM [facility]

## 2023-05-26 ENCOUNTER — Encounter: Payer: Self-pay | Admitting: Internal Medicine

## 2023-05-26 NOTE — Assessment & Plan Note (Signed)
Improved s/p procedures, for f/u [facility] soon

## 2023-05-26 NOTE — Assessment & Plan Note (Signed)
BP Readings from Last 3 Encounters:  05/23/23 126/74  11/22/22 (!) 160/100  07/06/22 (!) 148/96   Stable, pt to continue medical treatment amlodipine 10 qd

## 2023-05-26 NOTE — Assessment & Plan Note (Signed)
Mild to mod, for otc nasacort prn, asd, to f/u any worsening symptoms or concerns

## 2023-05-26 NOTE — Assessment & Plan Note (Signed)
Lab Results  Component Value Date   LDLCALC 93 05/23/2023   Stable, pt to continue lower chol diet

## 2023-05-26 NOTE — Assessment & Plan Note (Signed)
Pt encouraged to quit, pt not ready

## 2023-05-26 NOTE — Assessment & Plan Note (Signed)
Lab Results  Component Value Date   HGBA1C 6.3 05/23/2023   Stable, pt to continue current medical treatment  - diet,wt control

## 2023-05-26 NOTE — Assessment & Plan Note (Signed)
Last vitamin D Lab Results  Component Value Date   VD25OH 30.20 05/23/2023   Low, to start oral replacement

## 2023-10-16 ENCOUNTER — Other Ambulatory Visit: Payer: Self-pay | Admitting: Student

## 2023-10-16 DIAGNOSIS — K7030 Alcoholic cirrhosis of liver without ascites: Secondary | ICD-10-CM

## 2023-10-16 DIAGNOSIS — K8020 Calculus of gallbladder without cholecystitis without obstruction: Secondary | ICD-10-CM | POA: Diagnosis not present

## 2023-10-18 ENCOUNTER — Ambulatory Visit
Admission: RE | Admit: 2023-10-18 | Discharge: 2023-10-18 | Disposition: A | Discharge status: Home / Self Care | Source: Ambulatory Visit | Attending: Student | Admitting: Student

## 2023-10-18 ENCOUNTER — Other Ambulatory Visit (HOSPITAL_COMMUNITY): Payer: Self-pay | Admitting: Student

## 2023-10-18 DIAGNOSIS — K8020 Calculus of gallbladder without cholecystitis without obstruction: Secondary | ICD-10-CM | POA: Diagnosis not present

## 2023-10-18 DIAGNOSIS — K769 Liver disease, unspecified: Secondary | ICD-10-CM | POA: Diagnosis not present

## 2023-10-18 DIAGNOSIS — K7030 Alcoholic cirrhosis of liver without ascites: Secondary | ICD-10-CM

## 2023-10-21 ENCOUNTER — Other Ambulatory Visit: Payer: Self-pay | Admitting: Internal Medicine

## 2023-10-22 ENCOUNTER — Other Ambulatory Visit: Payer: Self-pay

## 2023-11-01 ENCOUNTER — Ambulatory Visit (HOSPITAL_COMMUNITY)
Admission: RE | Admit: 2023-11-01 | Discharge: 2023-11-01 | Disposition: A | Discharge status: Home / Self Care | Source: Ambulatory Visit | Attending: Student

## 2023-11-01 ENCOUNTER — Other Ambulatory Visit (HOSPITAL_COMMUNITY): Payer: Self-pay | Admitting: Student

## 2023-11-01 DIAGNOSIS — K8020 Calculus of gallbladder without cholecystitis without obstruction: Secondary | ICD-10-CM | POA: Insufficient documentation

## 2023-11-01 DIAGNOSIS — R932 Abnormal findings on diagnostic imaging of liver and biliary tract: Secondary | ICD-10-CM | POA: Diagnosis not present

## 2023-11-01 MED ORDER — TECHNETIUM TC 99M MEBROFENIN IV KIT
5.0000 | PACK | Freq: Once | INTRAVENOUS | Status: AC | PRN
  Administered 2023-11-01: 5 via INTRAVENOUS

## 2023-11-07 ENCOUNTER — Other Ambulatory Visit: Payer: Self-pay | Admitting: Gastroenterology

## 2023-11-07 DIAGNOSIS — K7030 Alcoholic cirrhosis of liver without ascites: Secondary | ICD-10-CM | POA: Insufficient documentation

## 2023-11-14 ENCOUNTER — Ambulatory Visit

## 2023-11-14 ENCOUNTER — Encounter: Admitting: Internal Medicine

## 2023-11-21 ENCOUNTER — Ambulatory Visit: Payer: 59 | Admitting: Internal Medicine

## 2023-11-23 ENCOUNTER — Ambulatory Visit (INDEPENDENT_AMBULATORY_CARE_PROVIDER_SITE_OTHER)

## 2023-11-23 VITALS — Ht 65.0 in | Wt 204.0 lb

## 2023-11-23 DIAGNOSIS — Z0000 Encounter for general adult medical examination without abnormal findings: Secondary | ICD-10-CM

## 2023-11-23 NOTE — Patient Instructions (Signed)
 Lori Oconnell , Thank you for taking time out of your busy schedule to complete your Annual Wellness Visit with me. I enjoyed our conversation and look forward to speaking with you again next year. I, as well as your care team,  appreciate your ongoing commitment to your health goals. Please review the following plan we discussed and let me know if I can assist you in the future. Your Game plan/ To Do List    Follow up Visits: Next Medicare AWV with our clinical staff: 11/25/2024.   Have you seen your provider in the last 6 months (3 months if uncontrolled diabetes)? Yes Next Office Visit with your provider: 11/28/2023.  Clinician Recommendations:  Aim for 30 minutes of exercise or brisk walking, 6-8 glasses of water, and 5 servings of fruits and vegetables each day. You are up to date on health maintenance.      This is a list of the screening recommended for you and due dates:  Health Maintenance  Topic Date Due   Medicare Annual Wellness Visit  05/29/2019   COVID-19 Vaccine (3 - Pfizer risk series) 11/07/2019   Flu Shot  01/11/2024   Mammogram  03/21/2025   Pap with HPV screening  01/05/2027   Colon Cancer Screening  01/12/2031   DTaP/Tdap/Td vaccine (3 - Td or Tdap) 10/14/2032   Pneumococcal Vaccination  Completed   Hepatitis C Screening  Completed   HIV Screening  Completed   Zoster (Shingles) Vaccine  Completed   HPV Vaccine  Aged Out   Meningitis B Vaccine  Aged Out    Advanced directives: (Declined) Advance directive discussed with you today. Even though you declined this today, please call our office should you change your mind, and we can give you the proper paperwork for you to fill out. Advance Care Planning is important because it:  [x]  Makes sure you receive the medical care that is consistent with your values, goals, and preferences  [x]  It provides guidance to your family and loved ones and reduces their decisional burden about whether or not they are making the right  decisions based on your wishes.  Follow the link provided in your after visit summary or read over the paperwork we have mailed to you to help you started getting your Advance Directives in place. If you need assistance in completing these, please reach out to us  so that we can help you!  See attachments for Preventive Care and Fall Prevention Tips.

## 2023-11-23 NOTE — Progress Notes (Signed)
 Subjective:   Lori Oconnell is a 63 y.o. who presents for a Medicare Wellness preventive visit.  As a reminder, Annual Wellness Visits don't include a physical exam, and some assessments may be limited, especially if this visit is performed virtually. We may recommend an in-person follow-up visit with your provider if needed.  Visit Complete: Virtual I connected with  Lori Oconnell on 11/23/23 by a audio enabled telemedicine application and verified that I am speaking with the correct person using two identifiers.  Patient Location: Home  Provider Location: Office/Clinic  I discussed the limitations of evaluation and management by telemedicine. The patient expressed understanding and agreed to proceed.  Vital Signs: Because this visit was a virtual/telehealth visit, some criteria may be missing or patient reported. Any vitals not documented were not able to be obtained and vitals that have been documented are patient reported.  VideoDeclined- This patient declined Librarian, academic. Therefore the visit was completed with audio only.  Persons Participating in Visit: Patient.  AWV Questionnaire: Yes: Patient Medicare AWV questionnaire was completed by the patient on 11/21/2023; I have confirmed that all information answered by patient is correct and no changes since this date.  Cardiac Risk Factors include: advanced age (>53men, >63 women);hypertension     Objective:    Today's Vitals   11/23/23 1137  Weight: 204 lb (92.5 kg)  Height: 5' 5 (1.651 m)   Body mass index is 33.95 kg/m.     11/23/2023   11:43 AM 01/11/2021    8:14 AM 10/18/2020    8:36 PM 05/28/2018    1:12 PM 05/09/2017   10:40 AM 06/09/2016   10:15 AM 03/24/2016   10:09 AM  Advanced Directives  Does Patient Have a Medical Advance Directive? No No No No  No  No  No   Would patient like information on creating a medical advance directive?  No - Patient declined No - Patient declined  No -  Patient declined        Data saved with a previous flowsheet row definition    Current Medications (verified) Outpatient Encounter Medications as of 11/23/2023  Medication Sig   amLODipine  (NORVASC ) 10 MG tablet TAKE 1 TABLET BY MOUTH EVERY DAY   Vitamin D , Ergocalciferol , (DRISDOL ) 1.25 MG (50000 UT) CAPS capsule Take 1 capsule (50,000 Units total) by mouth every 7 (seven) days.   citalopram  (CELEXA ) 20 MG tablet TAKE 1 TABLET BY MOUTH EVERY DAY   hydrOXYzine  (ATARAX ) 25 MG tablet Take 25 mg by mouth 3 (three) times daily as needed.   pantoprazole  (PROTONIX ) 40 MG tablet TAKE 1 TABLET BY MOUTH EVERY DAY (Patient not taking: Reported on 11/23/2023)   No facility-administered encounter medications on file as of 11/23/2023.    Allergies (verified) Patient has no known allergies.   History: Past Medical History:  Diagnosis Date   Anxiety    DISC DISEASE, CERVICAL 07/13/2009   Edema 03/22/2011   HEMATOCHEZIA 11/30/2009   HYPERLIPIDEMIA 07/18/2009   HYPERTENSION 07/12/2009   Irregular menses 03/22/2011   NECK PAIN 07/12/2009   OVERACTIVE BLADDER 07/12/2009   RENAL INSUFFICIENCY 07/12/2009   SINUSITIS- ACUTE-NOS 07/12/2009   Sleep apnea    prescribed cpap but has not gotten   TRANSAMINASES, SERUM, ELEVATED 07/12/2009   Trichomonal vaginitis 01/12/2022   Past Surgical History:  Procedure Laterality Date   COLONOSCOPY WITH PROPOFOL  N/A 01/11/2021   Procedure: COLONOSCOPY WITH PROPOFOL ;  Surgeon: Genell Ken, MD;  Location: [facility];  Service: Gastroenterology;  Laterality: N/A;   ESOPHAGOGASTRODUODENOSCOPY (EGD) WITH PROPOFOL  N/A 01/11/2021   Procedure: ESOPHAGOGASTRODUODENOSCOPY (EGD) WITH PROPOFOL ;  Surgeon: Genell Ken, MD;  Location: [facility];  Service: Gastroenterology;  Laterality: N/A;   MASS EXCISION N/A 08/27/2012   Procedure: EXCISION MASS UPPER BACK;  Surgeon: Brandy Cal. Cornett, MD;  Location: [facility];  Service: General;  Laterality: N/A;   POLYPECTOMY  01/11/2021   Procedure:  POLYPECTOMY;  Surgeon: Genell Ken, MD;  Location: [facility];  Service: Gastroenterology;;   s/p ganglion cyst Right 1980   TUBAL LIGATION  1990   Family History  Problem Relation Age of Onset   Hypertension Mother    Kidney failure Mother    Diabetes Maternal Aunt    Colon cancer Neg Hx    Social History   Socioeconomic History   Marital status: Married    Spouse name: Norvell Beers   Number of children: 2   Years of education: Not on file   Highest education level: 12th grade  Occupational History   Occupation: CNA  Tobacco Use   Smoking status: Some Days    Current packs/day: 0.25    Average packs/day: 0.3 packs/day for 5.0 years (1.3 ttl pk-yrs)    Types: Cigarettes   Smokeless tobacco: Former    Types: Chew   Tobacco comments:    smokes 2 to 3 cigarettes a day  Vaping Use   Vaping status: Never Used  Substance and Sexual Activity   Alcohol use: Not Currently    Alcohol/week: 3.0 standard drinks of alcohol    Types: 3 Standard drinks or equivalent per week   Drug use: No   Sexual activity: Not Currently    Partners: Male    Birth control/protection: None  Other Topics Concern   Not on file  Social History Narrative   Lives with husband/2025   Social Drivers of Health   Financial Resource Strain: Low Risk  (11/21/2023)   Overall Financial Resource Strain (CARDIA)    Difficulty of Paying Living Expenses: Not very hard  Food Insecurity: Food Insecurity Present (11/21/2023)   Hunger Vital Sign    Worried About Running Out of Food in the Last Year: Sometimes true    Ran Out of Food in the Last Year: Never true  Transportation Needs: No Transportation Needs (11/21/2023)   PRAPARE - Administrator, Civil Service (Medical): No    Lack of Transportation (Non-Medical): No  Physical Activity: Insufficiently Active (11/21/2023)   Exercise Vital Sign    Days of Exercise per Week: 3 days    Minutes of Exercise per Session: 20 min  Stress: No Stress Concern  Present (11/21/2023)   [employer] of Occupational Health - Occupational Stress Questionnaire    Feeling of Stress : Only a little  Social Connections: Moderately Integrated (11/21/2023)   Social Connection and Isolation Panel    Frequency of Communication with Friends and Family: Once a week    Frequency of Social Gatherings with Friends and Family: Twice a week    Attends Religious Services: More than 4 times per year    Active Member of [employer] or Organizations: No    Attends Engineer, structural: Not on file    Marital Status: Married    Tobacco Counseling Ready to quit: Not Answered Counseling given: Not Answered Tobacco comments: smokes 2 to 3 cigarettes a day    Clinical Intake:  Pre-visit preparation completed: Yes  Pain : No/denies pain     BMI - recorded: 33.95  Nutritional Status: BMI > 30  Obese Nutritional Risks: None Diabetes: No  Lab Results  Component Value Date   HGBA1C 6.3 05/23/2023   HGBA1C 5.9 05/23/2022   HGBA1C 5.7 11/17/2021     How often do you need to have someone help you when you read instructions, pamphlets, or other written materials from your doctor or pharmacy?: 1 - Never  Interpreter Needed?: No  Information entered by :: Kaia Depaolis, RMA   Activities of Daily Living     11/21/2023    2:00 PM  In your present state of health, do you have any difficulty performing the following activities:  Hearing? 0  Vision? 0  Difficulty concentrating or making decisions? 0  Walking or climbing stairs? 1  Dressing or bathing? 0  Doing errands, shopping? 0  Preparing Food and eating ? N  Using the Toilet? N  In the past six months, have you accidently leaked urine? N  Do you have problems with loss of bowel control? N  Managing your Medications? N  Managing your Finances? N  Housekeeping or managing your Housekeeping? N    Patient Care Team: Lori Coombe, MD as PCP - General  I have updated your Care Teams any recent  Medical Services you may have received from other providers in the past year.     Assessment:   This is a routine wellness examination for Lori Oconnell.  Hearing/Vision screen Hearing Screening - Comments:: Denies hearing difficulties   Vision Screening - Comments:: Denies vision issues./ Lori Oconnell    Goals Addressed             This Visit's Progress    Patient Stated   On track    I want to be healthy as possible, I want to continue to eat healthy, watch fat intake, increase water intake, increase physical activity, enjoy life and family.       Depression Screen     11/23/2023   11:45 AM 05/23/2023    8:57 AM 11/22/2022    2:36 PM 05/23/2022    2:48 PM 01/25/2022   11:04 AM 01/04/2022    8:37 AM 11/17/2021    1:58 PM  PHQ 2/9 Scores  PHQ - 2 Score 0 0 0 0 0 0 0  PHQ- 9 Score 0  0 0 0 0 0    Fall Risk     11/21/2023    2:00 PM 05/23/2023    8:57 AM 05/23/2022    2:48 PM 01/25/2022   11:04 AM 11/17/2021    2:19 PM  Fall Risk   Falls in the past year? 0 0 0 0 0  Number falls in past yr: 0 0  0 0  Injury with Fall? 0 0 0 0 0  Risk for fall due to :  No Fall Risks No Fall Risks No Fall Risks   Follow up Falls evaluation completed;Falls prevention discussed Falls evaluation completed Falls evaluation completed  Falls evaluation completed       Data saved with a previous flowsheet row definition    MEDICARE RISK AT HOME:  Medicare Risk at Home Any stairs in or around the home?: (Patient-Rptd) No If so, are there any without handrails?: (Patient-Rptd) Yes Home free of loose throw rugs in walkways, pet beds, electrical cords, etc?: (Patient-Rptd) Yes Adequate lighting in your home to reduce risk of falls?: (Patient-Rptd) Yes Life alert?: (Patient-Rptd) No Use of a cane, walker or w/c?: (Patient-Rptd) No Grab bars in the  bathroom?: (Patient-Rptd) No Shower chair or bench in shower?: (Patient-Rptd) No Elevated toilet seat or a handicapped toilet?: (Patient-Rptd)  No  TIMED UP AND GO:  Was the test performed?  No  Cognitive Function: Declined/Normal: No cognitive concerns noted by patient or family. Patient alert, oriented, able to answer questions appropriately and recall recent events. No signs of memory loss or confusion.    05/09/2017   10:44 AM  MMSE - Mini Mental State Exam  Orientation to time 5   Orientation to Place 5      Data saved with a previous flowsheet row definition        Immunizations Immunization History  Administered Date(s) Administered   Influenza Inj Mdck Quad Pf 02/15/2022   Influenza Split 03/22/2011   Influenza Whole 07/12/2009   Influenza, Quadrivalent, Recombinant, Inj, Pf 05/05/2019   Influenza, Seasonal, Injecte, Preservative Fre 05/06/2023   Influenza,inj,Quad PF,6+ Mos 07/02/2013, 05/26/2016, 05/09/2017, 05/15/2018   PFIZER(Purple Top)SARS-COV-2 Vaccination 09/11/2019, 10/10/2019   PNEUMOCOCCAL CONJUGATE-20 05/18/2021   Td 07/12/2009   Tdap 10/15/2022   Zoster Recombinant(Shingrix) 06/12/2019, 08/18/2019    Screening Tests Health Maintenance  Topic Date Due   COVID-19 Vaccine (3 - Pfizer risk series) 11/07/2019   INFLUENZA VACCINE  01/11/2024   Medicare Annual Wellness (AWV)  11/22/2024   MAMMOGRAM  03/21/2025   Cervical Cancer Screening (HPV/Pap Cotest)  01/05/2027   Colonoscopy  01/12/2031   DTaP/Tdap/Td (3 - Td or Tdap) 10/14/2032   Pneumococcal Vaccine 82-21 Years old  Completed   Hepatitis C Screening  Completed   HIV Screening  Completed   Zoster Vaccines- Shingrix  Completed   HPV VACCINES  Aged Out   Meningococcal B Vaccine  Aged Out    Health Maintenance  Health Maintenance Due  Topic Date Due   COVID-19 Vaccine (3 - Pfizer risk series) 11/07/2019   Health Maintenance Items Addressed: See Nurse Notes at the end of this note  Additional Screening:  Vision Screening: Recommended annual ophthalmology exams for early detection of glaucoma and other disorders of the  eye. Would you like a referral to an eye doctor? No    Dental Screening: Recommended annual dental exams for proper oral hygiene  Community Resource Referral / Chronic Care Management: CRR required this visit?  No   CCM required this visit?  No   Plan:    I have personally reviewed and noted the following in the patient's chart:   Medical and social history Use of alcohol, tobacco or illicit drugs  Current medications and supplements including opioid prescriptions. Patient is not currently taking opioid prescriptions. Functional ability and status Nutritional status Physical activity Advanced directives List of other physicians Hospitalizations, surgeries, and ER visits in previous 12 months Vitals Screenings to include cognitive, depression, and falls Referrals and appointments  In addition, I have reviewed and discussed with patient certain preventive protocols, quality metrics, and best practice recommendations. A written personalized care plan for preventive services as well as general preventive health recommendations were provided to patient.   Gerrod Maule L Mcihael Hinderman, CMA   11/23/2023   After Visit Summary: (MyChart) Due to this being a telephonic visit, the after visit summary with patients personalized plan was offered to patient via MyChart   Notes: Nothing significant to report at this time.

## 2023-11-28 ENCOUNTER — Ambulatory Visit (INDEPENDENT_AMBULATORY_CARE_PROVIDER_SITE_OTHER): Admitting: Internal Medicine

## 2023-11-28 ENCOUNTER — Ambulatory Visit: Payer: Self-pay | Admitting: Internal Medicine

## 2023-11-28 ENCOUNTER — Encounter: Payer: Self-pay | Admitting: Internal Medicine

## 2023-11-28 VITALS — BP 148/86 | HR 83 | Temp 99.2°F | Ht 65.0 in | Wt 204.0 lb

## 2023-11-28 DIAGNOSIS — E538 Deficiency of other specified B group vitamins: Secondary | ICD-10-CM

## 2023-11-28 DIAGNOSIS — E669 Obesity, unspecified: Secondary | ICD-10-CM | POA: Diagnosis not present

## 2023-11-28 DIAGNOSIS — I10 Essential (primary) hypertension: Secondary | ICD-10-CM | POA: Diagnosis not present

## 2023-11-28 DIAGNOSIS — L309 Dermatitis, unspecified: Secondary | ICD-10-CM | POA: Diagnosis not present

## 2023-11-28 DIAGNOSIS — Z0000 Encounter for general adult medical examination without abnormal findings: Secondary | ICD-10-CM

## 2023-11-28 DIAGNOSIS — E7800 Pure hypercholesterolemia, unspecified: Secondary | ICD-10-CM | POA: Diagnosis not present

## 2023-11-28 DIAGNOSIS — R739 Hyperglycemia, unspecified: Secondary | ICD-10-CM | POA: Diagnosis not present

## 2023-11-28 DIAGNOSIS — E559 Vitamin D deficiency, unspecified: Secondary | ICD-10-CM | POA: Diagnosis not present

## 2023-11-28 DIAGNOSIS — F17200 Nicotine dependence, unspecified, uncomplicated: Secondary | ICD-10-CM | POA: Diagnosis not present

## 2023-11-28 DIAGNOSIS — Z0001 Encounter for general adult medical examination with abnormal findings: Secondary | ICD-10-CM

## 2023-11-28 LAB — URINALYSIS, ROUTINE W REFLEX MICROSCOPIC
Bilirubin Urine: NEGATIVE
Hgb urine dipstick: NEGATIVE
Ketones, ur: NEGATIVE
Leukocytes,Ua: NEGATIVE
Nitrite: NEGATIVE
RBC / HPF: NONE SEEN
Specific Gravity, Urine: 1.01 (ref 1.000–1.030)
Total Protein, Urine: NEGATIVE
Urine Glucose: NEGATIVE
Urobilinogen, UA: 0.2 (ref 0.0–1.0)
pH: 6 (ref 5.0–8.0)

## 2023-11-28 LAB — BASIC METABOLIC PANEL WITH GFR
BUN: 11 mg/dL (ref 6–23)
CO2: 30 meq/L (ref 19–32)
Calcium: 9.5 mg/dL (ref 8.4–10.5)
Chloride: 106 meq/L (ref 96–112)
Creatinine, Ser: 0.89 mg/dL (ref 0.40–1.20)
GFR: 70.1 mL/min
Glucose, Bld: 86 mg/dL (ref 70–99)
Potassium: 3.9 meq/L (ref 3.5–5.1)
Sodium: 140 meq/L (ref 135–145)

## 2023-11-28 LAB — CBC WITH DIFFERENTIAL/PLATELET
Basophils Absolute: 0 10*3/uL (ref 0.0–0.1)
Basophils Relative: 0.4 % (ref 0.0–3.0)
Eosinophils Absolute: 0.4 10*3/uL (ref 0.0–0.7)
Eosinophils Relative: 6.6 % — ABNORMAL HIGH (ref 0.0–5.0)
HCT: 35.3 % — ABNORMAL LOW (ref 36.0–46.0)
Hemoglobin: 12.1 g/dL (ref 12.0–15.0)
Lymphocytes Relative: 45.3 % (ref 12.0–46.0)
Lymphs Abs: 3 10*3/uL (ref 0.7–4.0)
MCHC: 34.2 g/dL (ref 30.0–36.0)
MCV: 94.5 fl (ref 78.0–100.0)
Monocytes Absolute: 0.4 10*3/uL (ref 0.1–1.0)
Monocytes Relative: 6 % (ref 3.0–12.0)
Neutro Abs: 2.8 10*3/uL (ref 1.4–7.7)
Neutrophils Relative %: 41.7 % — ABNORMAL LOW (ref 43.0–77.0)
Platelets: 223 10*3/uL (ref 150.0–400.0)
RBC: 3.74 Mil/uL — ABNORMAL LOW (ref 3.87–5.11)
RDW: 13.1 % (ref 11.5–15.5)
WBC: 6.6 10*3/uL (ref 4.0–10.5)

## 2023-11-28 LAB — HEMOGLOBIN A1C: Hgb A1c MFr Bld: 6 % (ref 4.6–6.5)

## 2023-11-28 LAB — HEPATIC FUNCTION PANEL
ALT: 13 U/L (ref 0–35)
AST: 18 U/L (ref 0–37)
Albumin: 4.4 g/dL (ref 3.5–5.2)
Alkaline Phosphatase: 154 U/L — ABNORMAL HIGH (ref 39–117)
Bilirubin, Direct: 0.1 mg/dL (ref 0.0–0.3)
Total Bilirubin: 0.7 mg/dL (ref 0.2–1.2)
Total Protein: 7.5 g/dL (ref 6.0–8.3)

## 2023-11-28 LAB — LIPID PANEL
Cholesterol: 181 mg/dL (ref 0–200)
HDL: 61.2 mg/dL (ref >39.00)
LDL Cholesterol: 98 mg/dL (ref 0–99)
NonHDL: 120.28
Total CHOL/HDL Ratio: 3
Triglycerides: 110 mg/dL (ref 0.0–149.0)
VLDL: 22 mg/dL (ref 0.0–40.0)

## 2023-11-28 LAB — VITAMIN B12: Vitamin B-12: 518 pg/mL (ref 211–911)

## 2023-11-28 LAB — VITAMIN D 25 HYDROXY (VIT D DEFICIENCY, FRACTURES): VITD: 36.87 ng/mL (ref 30.00–100.00)

## 2023-11-28 LAB — TSH: TSH: 1.24 u[IU]/mL (ref 0.35–5.50)

## 2023-11-28 MED ORDER — AMLODIPINE BESYLATE 10 MG PO TABS: 10.0000 mg | ORAL_TABLET | Freq: Every day | ORAL | 3 refills | Status: AC

## 2023-11-28 MED ORDER — BETAMETHASONE DIPROPIONATE 0.05 % EX CREA: TOPICAL_CREAM | Freq: Two times a day (BID) | CUTANEOUS | 1 refills | Status: AC

## 2023-11-28 MED ORDER — PREDNISONE 10 MG PO TABS: ORAL_TABLET | ORAL | 0 refills | Status: AC

## 2023-11-28 MED ORDER — PHENTERMINE HCL 30 MG PO CAPS: 30.0000 mg | ORAL_CAPSULE | ORAL | 1 refills | Status: AC

## 2023-11-28 NOTE — Patient Instructions (Addendum)
 Please take all new medication as prescribed - the phentermine for weight, prednisone  and steroid cream as directed  Remember to check your BP at home and let us  know if remains elevated  Please continue all other medications as before, and refills have been done if requested.  Please have the pharmacy call with any other refills you may need.  Please continue your efforts at being more active, low cholesterol diet, and weight control.  You are otherwise up to date with prevention measures today.  Please keep your appointments with your specialists as you may have planned  Please go to the LAB at the blood drawing area for the tests to be done  You will be contacted by phone if any changes need to be made immediately.  Otherwise, you will receive a letter about your results with an explanation, but please check with MyChart first.  Please make an Appointment to return in 6 months, or sooner if needed

## 2023-11-28 NOTE — Progress Notes (Signed)
 The test results show that your current treatment is OK, as the tests are stable.  Please continue the same plan.  There is no other need for change of treatment or further evaluation based on these results, at this time.  thanks

## 2023-11-28 NOTE — Progress Notes (Unsigned)
 Patient ID: Lori Oconnell, female   DOB: 1960/12/05, 63 y.o.   MRN: [id number]         Chief Complaint:: wellness exam and Annual Exam  ***       HPI:  Lori Oconnell is a 63 y.o. female here for wellness exam                        Also***   Hard to lose wt.   Wt Readings from Last 3 Encounters:  11/28/23 204 lb (92.5 kg)  11/23/23 204 lb (92.5 kg)  05/23/23 204 lb (92.5 kg)   BP Readings from Last 3 Encounters:  11/28/23 (!) 148/86  05/23/23 126/74  11/22/22 (!) 160/100   Immunization History  Administered Date(s) Administered   Influenza Inj Mdck Quad Pf 02/15/2022   Influenza Split 03/22/2011   Influenza Whole 07/12/2009   Influenza, Quadrivalent, Recombinant, Inj, Pf 05/05/2019   Influenza, Seasonal, Injecte, Preservative Fre 05/06/2023   Influenza,inj,Quad PF,6+ Mos 07/02/2013, 05/26/2016, 05/09/2017, 05/15/2018   PFIZER(Purple Top)SARS-COV-2 Vaccination 09/11/2019, 10/10/2019   PNEUMOCOCCAL CONJUGATE-20 05/18/2021   Td 07/12/2009   Tdap 10/15/2022   Zoster Recombinant(Shingrix) 06/12/2019, 08/18/2019   Health Maintenance Due  Topic Date Due   COVID-19 Vaccine (3 - Pfizer risk series) 11/07/2019      Past Medical History:  Diagnosis Date   Anxiety    DISC DISEASE, CERVICAL 07/13/2009   Edema 03/22/2011   HEMATOCHEZIA 11/30/2009   HYPERLIPIDEMIA 07/18/2009   HYPERTENSION 07/12/2009   Irregular menses 03/22/2011   NECK PAIN 07/12/2009   OVERACTIVE BLADDER 07/12/2009   RENAL INSUFFICIENCY 07/12/2009   SINUSITIS- ACUTE-NOS 07/12/2009   Sleep apnea    prescribed cpap but has not gotten   TRANSAMINASES, SERUM, ELEVATED 07/12/2009   Trichomonal vaginitis 01/12/2022   Past Surgical History:  Procedure Laterality Date   COLONOSCOPY WITH PROPOFOL  N/A 01/11/2021   Procedure: COLONOSCOPY WITH PROPOFOL ;  Surgeon: Genell Ken, MD;  Location: [facility];  Service: Gastroenterology;  Laterality: N/A;   ESOPHAGOGASTRODUODENOSCOPY (EGD) WITH PROPOFOL  N/A 01/11/2021   Procedure:  ESOPHAGOGASTRODUODENOSCOPY (EGD) WITH PROPOFOL ;  Surgeon: Genell Ken, MD;  Location: [facility];  Service: Gastroenterology;  Laterality: N/A;   MASS EXCISION N/A 08/27/2012   Procedure: EXCISION MASS UPPER BACK;  Surgeon: Brandy Cal. Cornett, MD;  Location: [facility];  Service: General;  Laterality: N/A;   POLYPECTOMY  01/11/2021   Procedure: POLYPECTOMY;  Surgeon: Genell Ken, MD;  Location: [facility];  Service: Gastroenterology;;   s/p ganglion cyst Right 1980   TUBAL LIGATION  1990    reports that she has been smoking cigarettes. She has a 1.3 pack-year smoking history. She has quit using smokeless tobacco.  Her smokeless tobacco use included chew. She reports that she does not currently use alcohol after a past usage of about 3.0 standard drinks of alcohol per week. She reports that she does not use drugs. family history includes Diabetes in her maternal aunt; Hypertension in her mother; Kidney failure in her mother. No Known Allergies Current Outpatient Medications on File Prior to Visit  Medication Sig Dispense Refill   amLODipine  (NORVASC ) 10 MG tablet TAKE 1 TABLET BY MOUTH EVERY DAY 90 tablet 3   Vitamin D , Ergocalciferol , (DRISDOL ) 1.25 MG (50000 UT) CAPS capsule Take 1 capsule (50,000 Units total) by mouth every 7 (seven) days. 12 capsule 0   pantoprazole  (PROTONIX ) 40 MG tablet TAKE 1 TABLET BY MOUTH EVERY DAY (Patient not taking: Reported on 11/28/2023) 90 tablet 3  No current facility-administered medications on file prior to visit.        ROS:  All others reviewed and negative.  Objective        PE:  BP (!) 148/86 (BP Location: Right Arm, Patient Position: Sitting, Cuff Size: Normal)   Pulse 83   Temp 99.2 F (37.3 C) (Oral)   Ht 5' 5 (1.651 m)   Wt 204 lb (92.5 kg)   LMP 03/19/2011   SpO2 100%   BMI 33.95 kg/m                 Constitutional: Pt appears in NAD               HENT: Head: NCAT.                Right Ear: External ear normal.                 Left Ear:  External ear normal.                Eyes: . Pupils are equal, round, and reactive to light. Conjunctivae and EOM are normal               Nose: without d/c or deformity               Neck: Neck supple. Gross normal ROM               Cardiovascular: Normal rate and regular rhythm.                 Pulmonary/Chest: Effort normal and breath sounds without rales or wheezing.                Abd:  Soft, NT, ND, + BS, no organomegaly               Neurological: Pt is alert. At baseline orientation, motor grossly intact               Skin: Skin is warm. No rashes, no other new lesions, LE edema - ***               Psychiatric: Pt behavior is normal without agitation   Micro: none  Cardiac tracings I have personally interpreted today:  none  Pertinent Radiological findings (summarize): none   Lab Results  Component Value Date   WBC 6.7 05/23/2023   HGB 11.5 (L) 05/23/2023   HCT 34.7 (L) 05/23/2023   PLT 229.0 05/23/2023   GLUCOSE 94 05/23/2023   CHOL 176 05/23/2023   TRIG 81.0 05/23/2023   HDL 66.90 05/23/2023   LDLDIRECT 119.0 11/04/2015   LDLCALC 93 05/23/2023   ALT 9 05/23/2023   AST 16 05/23/2023   NA 139 05/23/2023   K 3.8 05/23/2023   CL 103 05/23/2023   CREATININE 0.80 05/23/2023   BUN 12 05/23/2023   CO2 30 05/23/2023   TSH 1.27 05/23/2023   INR 1.9 (H) 10/27/2020   HGBA1C 6.3 05/23/2023   Assessment/Plan:  Lori Oconnell is a 63 y.o. Black or African American [2] female with  has a past medical history of Anxiety, DISC DISEASE, CERVICAL (07/13/2009), Edema (03/22/2011), HEMATOCHEZIA (11/30/2009), HYPERLIPIDEMIA (07/18/2009), HYPERTENSION (07/12/2009), Irregular menses (03/22/2011), NECK PAIN (07/12/2009), OVERACTIVE BLADDER (07/12/2009), RENAL INSUFFICIENCY (07/12/2009), SINUSITIS- ACUTE-NOS (07/12/2009), Sleep apnea, TRANSAMINASES, SERUM, ELEVATED (07/12/2009), and Trichomonal vaginitis (01/12/2022).  No problem-specific Assessment & Plan notes found for this encounter.  Followup: No  follow-ups on file.  Rosalia Colonel, MD 11/28/2023 2:29  PM [facility]

## 2023-12-01 ENCOUNTER — Encounter: Payer: Self-pay | Admitting: Internal Medicine

## 2023-12-01 DIAGNOSIS — E669 Obesity, unspecified: Secondary | ICD-10-CM | POA: Insufficient documentation

## 2023-12-01 DIAGNOSIS — L309 Dermatitis, unspecified: Secondary | ICD-10-CM | POA: Insufficient documentation

## 2023-12-01 NOTE — Assessment & Plan Note (Addendum)
Age and sex appropriate education and counseling updated with regular exercise and diet Referrals for preventative services - none needed Immunizations addressed - none needed Smoking counseling  - pt counsled to quit, pt not ready Evidence for depression or other mood disorder - none significant Most recent labs reviewed. I have personally reviewed and have noted: 1) the patient's medical and social history 2) The patient's current medications and supplements 3) The patient's height, weight, and BMI have been recorded in the chart

## 2023-12-01 NOTE — Assessment & Plan Note (Addendum)
 Mod to severe bilateral palms - for betamethosone cr asd in addition to short course oral prednisone 

## 2023-12-01 NOTE — Assessment & Plan Note (Signed)
 Pt counsled to quit, pt not ready

## 2023-12-01 NOTE — Assessment & Plan Note (Signed)
 BP Readings from Last 3 Encounters:  11/28/23 (!) 148/86  05/23/23 126/74  11/22/22 (!) 160/100   uncontrolled, pt to continue medical treatment norvasc  10 every day, declines change today, will cont to monitor at home

## 2023-12-01 NOTE — Assessment & Plan Note (Signed)
 Last vitamin D  Lab Results  Component Value Date   VD25OH 36.87 11/28/2023   Low, to start oral replacement

## 2023-12-01 NOTE — Assessment & Plan Note (Signed)
 Lab Results  Component Value Date   LDLCALC 98 11/28/2023   Stable, pt to continue lower chol diet

## 2023-12-01 NOTE — Assessment & Plan Note (Signed)
 Lab Results  Component Value Date   HGBA1C 6.0 11/28/2023   Stable, pt to continue current medical treatment  - diet, wt control

## 2023-12-01 NOTE — Assessment & Plan Note (Signed)
 Ok for phentermine 30 qd

## 2024-01-15 ENCOUNTER — Encounter (HOSPITAL_COMMUNITY): Payer: Self-pay | Admitting: Gastroenterology

## 2024-01-15 ENCOUNTER — Other Ambulatory Visit: Payer: Self-pay

## 2024-01-19 ENCOUNTER — Other Ambulatory Visit: Payer: Self-pay | Admitting: Internal Medicine

## 2024-01-21 NOTE — H&P (Signed)
 HPI:  Lori Oconnell management cirrhosis.  Established patient with Cirrhosis,alcohol related; now alcohol abstinent.  Has no abdominal pain, blood in stool, abdominal distention, confusion.  Had normal Hgb, WBC, Plts in June; ALP at that time 154 with other LFTs normal.    History gallstones with normal HIDA; she has no symptoms to suggest symptomatic gallstones.  AFP normal.  Recent abdominal ultrasound no liver lesions.  Last variceal screening EGD was 2022; no varices seen at that time. ROS:     GI PROCEDURE:         Pacemaker/ AICD no.  Artificial heart valves no.  MI/heart attack no.  Abnormal heart rhythm no.  Angina no.  CVA no.  Hypertension YES.  Hypotension no.  Asthma, COPD no.  Sleep apnea YES.  Seizure disorders no.  Artificial joints no.  Severe DJD no.  Diabetes no.  Significant headaches no.  Vertigo no.  Depression/anxiety YES, Anxiety .  Abnormal bleeding no.  Kidney Disease no.  Liver disease YES.  Chance of pregnancy no.  Blood transfusion YES.         Medical History:   Anxiety, Disc Disease; Cervical, Edema, HLD, HTN, Overactive bladder, Sleep Apnea, Alcoholic cirrhosis, Alcoholic hepatitis.  Surgical History:  Mass excision; upper back 2014, ganglion cyst 1980, Tubal Ligation 1990, Colonoscopy 01/2021, Endoscopy 01/2021, lipoma 02/2023. Hospitalization/Major Diagnostic Procedure: fainting, hypotension 10/2020, Lipoma surgery 02/2023. Family History:  Father: deceased.  Mother: deceased.  Sister 1: alive.  Sister 2: alive.  No Family History of Colon Cancer, Colon Polyps, or Liver Disease.  Social History:     General: Tobacco use  cigarettes:  Current smoker 4-6 cigarettes a day, Frequency:  intermittent smoker, Tobacco history last updated  01/02/2024, Vaping  No. Alcohol: no. Recreational drug use: no, no. Medications: Taking amLODIPine  Besylate 5 MG Tablet TAKE 1 TABLET BY MOUTH EVERY DAY Oral , Discontinued OTC . SABRA VitaminD 3 1 gummy orally once a day , Discontinued  Pantoprazole  Sodium 40 MG Tablet Delayed Release 1 tablet Orally Once a day , Discontinued Vitamin D  3 50,000 units pill 1 pill orally once a week , Discontinued Hydrocortisone 10 MG Tablet 1 tablet with food or milk Orally every 12 hrs , Notes to Pharmacist: PRN, Discontinued Furosemide  40 MG Tablet 1 tablet Orally Once a day , Discontinued hydrOXYzine  HCl 25 MG Tablet 1 tablet Orally three times a day as needed , Discontinued Spironolactone 100 MG Tablet TAKE 1 TABLET BY MOUTH EVERY DAY FOR 90 DAYS , Notes to Pharmacist: Pt stopped, Discontinued Folic Acid  1 MG Tablet 1 tablet Orally Once a day , Discontinued Lactulose  10 GM/15ML Solution 15 ml Orally Once a day , Discontinued Citalopram  Hydrobromide 20 MG Tablet 1 tablet Orally Once a day , Discontinued Vitamin B-1 100 MG Tablet 1 tablet Orally Once a day , Discontinued OTC . . , Notes to Pharmacist: mutivitamin 1x a day, Discontinued Escitalopram  Oxalate 10 MG Tablet TAKE 1 TABLET BY MOUTH EVERY DAY Oral , Discontinued Solifenacin  Succinate 5 MG Tablet TAKE 1 TABLET BY MOUTH EVERY DAY Oral  Allergies: N.K.D.A.  Objective: Vitals:  Wt: 205.0, Wt change: 1.6 lbs, Ht: 64, BMI: 35.18, Temp: 98.2, Pulse sitting: 99, BP sitting: 125/82.  Examination:    General Examination:        CONSTITUTIONAL: alert, oriented, overweight, NAD. HEENT: grossly normal, atraumatic, nonicteric. ABDOMEN: soft, non-tender, no organomegaly, no ascites. NEUROLOGIC EXAM: No asterixis, no encephalopathy . PSYCH: Normal mood and affect .  Physical Examination:     Chaperone present:         Chaperone present  Lori Oconnell 01/02/2024 01:22:38 PM > .       Assessment: Assessment: 1. Alcoholic cirrhosis of liver without ascites - K70.30 (Primary)     Plan: Treatment: 1. Alcoholic cirrhosis of liver without ascites        Imaging: Endoscopy Notes: Endoscopy due, to be done Dr. Saintclair (patient's primary GI MD) next month. Continued alcohol abstinence. Repeat RUQ U/S  November 2025.   Follow Up: 6 Months

## 2024-01-21 NOTE — Anesthesia Preprocedure Evaluation (Addendum)
 Anesthesia Evaluation  Patient identified by MRN, date of birth, ID band Patient awake    Reviewed: Allergy & Precautions, H&P , NPO status , Patient's Chart, lab work & pertinent test results  Airway Mallampati: III  TM Distance: >3 FB Neck ROM: Full    Dental no notable dental hx. (+) Teeth Intact, Dental Advisory Given   Pulmonary neg pulmonary ROS, Current Smoker   Pulmonary exam normal breath sounds clear to auscultation       Cardiovascular Exercise Tolerance: Good hypertension, Pt. on medications negative cardio ROS  Rhythm:Regular Rate:Normal     Neuro/Psych   Anxiety Depression    negative neurological ROS     GI/Hepatic negative GI ROS,,,(+) Cirrhosis   Esophageal Varices      Endo/Other  negative endocrine ROS    Renal/GU Renal InsufficiencyRenal disease  negative genitourinary   Musculoskeletal   Abdominal   Peds  Hematology  (+) Blood dyscrasia, anemia   Anesthesia Other Findings   Reproductive/Obstetrics negative OB ROS                              Anesthesia Physical Anesthesia Plan  ASA: 3  Anesthesia Plan: MAC   Post-op Pain Management: Minimal or no pain anticipated   Induction: Intravenous  PONV Risk Score and Plan: 1 and Propofol  infusion  Airway Management Planned: Natural Airway and Simple Face Mask  Additional Equipment:   Intra-op Plan:   Post-operative Plan:   Informed Consent: I have reviewed the patients History and Physical, chart, labs and discussed the procedure including the risks, benefits and alternatives for the proposed anesthesia with the patient or authorized representative who has indicated his/her understanding and acceptance.     Dental advisory given  Plan Discussed with: CRNA  Anesthesia Plan Comments:          Anesthesia Quick Evaluation

## 2024-01-22 ENCOUNTER — Ambulatory Visit (HOSPITAL_COMMUNITY)
Admission: RE | Admit: 2024-01-22 | Discharge: 2024-01-22 | Disposition: A | Discharge status: Home / Self Care | Attending: Gastroenterology | Admitting: Gastroenterology

## 2024-01-22 ENCOUNTER — Encounter (HOSPITAL_COMMUNITY)
Admission: RE | Disposition: A | Discharge status: Home / Self Care | Payer: Self-pay | Source: Home / Self Care | Attending: Gastroenterology

## 2024-01-22 ENCOUNTER — Encounter (HOSPITAL_COMMUNITY): Payer: Self-pay | Admitting: Gastroenterology

## 2024-01-22 ENCOUNTER — Other Ambulatory Visit: Payer: Self-pay

## 2024-01-22 ENCOUNTER — Encounter (HOSPITAL_COMMUNITY): Admitting: Anesthesiology

## 2024-01-22 ENCOUNTER — Ambulatory Visit (HOSPITAL_BASED_OUTPATIENT_CLINIC_OR_DEPARTMENT_OTHER): Admitting: Anesthesiology

## 2024-01-22 DIAGNOSIS — F418 Other specified anxiety disorders: Secondary | ICD-10-CM | POA: Diagnosis not present

## 2024-01-22 DIAGNOSIS — Z79899 Other long term (current) drug therapy: Secondary | ICD-10-CM | POA: Diagnosis not present

## 2024-01-22 DIAGNOSIS — F32A Depression, unspecified: Secondary | ICD-10-CM | POA: Insufficient documentation

## 2024-01-22 DIAGNOSIS — I10 Essential (primary) hypertension: Secondary | ICD-10-CM | POA: Diagnosis not present

## 2024-01-22 DIAGNOSIS — F419 Anxiety disorder, unspecified: Secondary | ICD-10-CM | POA: Diagnosis not present

## 2024-01-22 DIAGNOSIS — K7460 Unspecified cirrhosis of liver: Secondary | ICD-10-CM | POA: Diagnosis not present

## 2024-01-22 DIAGNOSIS — F17210 Nicotine dependence, cigarettes, uncomplicated: Secondary | ICD-10-CM | POA: Diagnosis not present

## 2024-01-22 DIAGNOSIS — K7030 Alcoholic cirrhosis of liver without ascites: Secondary | ICD-10-CM

## 2024-01-22 DIAGNOSIS — F17200 Nicotine dependence, unspecified, uncomplicated: Secondary | ICD-10-CM | POA: Diagnosis not present

## 2024-01-22 HISTORY — PX: ESOPHAGOGASTRODUODENOSCOPY: SHX5428

## 2024-01-22 SURGERY — EGD (ESOPHAGOGASTRODUODENOSCOPY)
Anesthesia: Monitor Anesthesia Care

## 2024-01-22 MED ORDER — PROPOFOL 500 MG/50ML IV EMUL
INTRAVENOUS | Status: DC | PRN
  Administered 2024-01-22 (×2): 140 ug/kg/min via INTRAVENOUS

## 2024-01-22 MED ORDER — DEXMEDETOMIDINE HCL IN NACL 80 MCG/20ML IV SOLN
INTRAVENOUS | Status: AC
  Filled 2024-01-22: qty 20

## 2024-01-22 MED ORDER — LIDOCAINE 2% (20 MG/ML) 5 ML SYRINGE
INTRAMUSCULAR | Status: DC | PRN
  Administered 2024-01-22 (×2): 60 mg via INTRAVENOUS

## 2024-01-22 MED ORDER — SODIUM CHLORIDE 0.9 % IV SOLN
INTRAVENOUS | Status: AC | PRN
  Administered 2024-01-22 (×2): 500 mL via INTRAMUSCULAR

## 2024-01-22 MED ORDER — PROPOFOL 500 MG/50ML IV EMUL
INTRAVENOUS | Status: AC
  Filled 2024-01-22: qty 50

## 2024-01-22 MED ORDER — PROPOFOL 10 MG/ML IV BOLUS
INTRAVENOUS | Status: DC | PRN
  Administered 2024-01-22: 30 mg via INTRAVENOUS
  Administered 2024-01-22 (×4): 20 mg via INTRAVENOUS
  Administered 2024-01-22: 30 mg via INTRAVENOUS

## 2024-01-22 MED ORDER — PROPOFOL 10 MG/ML IV BOLUS
INTRAVENOUS | Status: AC
  Filled 2024-01-22: qty 20

## 2024-01-22 MED ORDER — SODIUM CHLORIDE 0.9 % IV SOLN: INTRAVENOUS | Status: DC

## 2024-01-22 NOTE — Op Note (Signed)
 [facility] Patient Name: Lori Oconnell Procedure Date: 01/22/2024 MRN: [id number] Attending MD: Estelita Manas , MD, [id number] Date of Birth: 1960/10/09 CSN: [id number] Age: 63 Admit Type: Outpatient Procedure:                Upper GI endoscopy Indications:              Cirrhosis rule out esophageal varices Providers:                Estelita Manas, MD, Gregoria Pierce, RN, Haskel Chris, Technician Referring MD:             Lynwood Rush, MD Medicines:                Monitored Anesthesia Care Complications:            No immediate complications. Estimated Blood Loss:     Estimated blood loss: none. Procedure:                Pre-Anesthesia Assessment:                           - Prior to the procedure, a History and Physical                            was performed, and patient medications and                            allergies were reviewed. The patient's tolerance of                            previous anesthesia was also reviewed. The risks                            and benefits of the procedure and the sedation                            options and risks were discussed with the patient.                            All questions were answered, and informed consent                            was obtained. Prior Anticoagulants: The patient has                            taken no anticoagulant or antiplatelet agents. ASA                            Grade Assessment: III - A patient with severe                            systemic disease. After reviewing the risks and  benefits, the patient was deemed in satisfactory                            condition to undergo the procedure.                           After obtaining informed consent, the endoscope was                            passed under direct vision. Throughout the                            procedure, the patient's blood pressure, pulse, and                             oxygen saturations were monitored continuously. The                            [id number]-[id number] ([id number]) Olympus endoscope was introduced                            through the mouth, and advanced to the second part                            of duodenum. The upper GI endoscopy was                            accomplished without difficulty. The patient                            tolerated the procedure well. Scope In: Scope Out: Findings:      The examined esophagus was normal.      The Z-line was regular and was found 35 cm from the incisors.      The entire examined stomach was normal.      The cardia and gastric fundus were normal on retroflexion.      The examined duodenum was normal. Impression:               - Normal esophagus.                           - Z-line regular, 35 cm from the incisors.                           - Normal stomach.                           - Normal examined duodenum.                           - No specimens collected. Moderate Sedation:      Patient did not receive moderate sedation for this procedure, but       instead received monitored anesthesia care. Recommendation:           - Patient has a contact number available for  emergencies. The signs and symptoms of potential                            delayed complications were discussed with the                            patient. Return to normal activities tomorrow.                            Written discharge instructions were provided to the                            patient.                           - Resume regular diet.                           - Continue present medications.                           - Repeat upper endoscopy in 3 years for screening                            purposes. Procedure Code(s):        --- Professional ---                           [id number], Esophagogastroduodenoscopy, flexible,                            transoral; diagnostic, including  collection of                            specimen(s) by brushing or washing, when performed                            (separate procedure) Diagnosis Code(s):        --- Professional ---                           K74.60, Unspecified cirrhosis of liver CPT copyright 2022 American Medical Association. All rights reserved. The codes documented in this report are preliminary and upon coder review may  be revised to meet current compliance requirements. Estelita Manas, MD 01/22/2024 8:47:42 AM This report has been signed electronically. Number of Addenda: 0

## 2024-01-22 NOTE — Transfer of Care (Signed)
 Immediate Anesthesia Transfer of Care Note  Patient: Lori Oconnell  Procedure(s) Performed: EGD (ESOPHAGOGASTRODUODENOSCOPY)  Patient Location: PACU  Anesthesia Type:MAC  Level of Consciousness: drowsy  Airway & Oxygen Therapy: Patient Spontanous Breathing and Patient connected to face mask oxygen  Post-op Assessment: Report given to RN and Post -op Vital signs reviewed and stable  Post vital signs: Reviewed and stable  Last Vitals:  Vitals Value Taken Time  BP 120/63 01/22/24 08:50  Temp    Pulse 63 01/22/24 08:50  Resp 13 01/22/24 08:50  SpO2 100 % 01/22/24 08:50  Vitals shown include unfiled device data.  Last Pain:  Vitals:   01/22/24 0716  TempSrc: Temporal  PainSc: 0-No pain         Complications: No notable events documented.

## 2024-01-22 NOTE — Discharge Instructions (Addendum)

## 2024-01-22 NOTE — Anesthesia Postprocedure Evaluation (Signed)
 Anesthesia Post Note  Patient: Arhianna Ebey  Procedure(s) Performed: EGD (ESOPHAGOGASTRODUODENOSCOPY)     Patient location during evaluation: Endoscopy Anesthesia Type: MAC Level of consciousness: awake and alert Pain management: pain level controlled Vital Signs Assessment: post-procedure vital signs reviewed and stable Respiratory status: spontaneous breathing, nonlabored ventilation and respiratory function stable Cardiovascular status: stable and blood pressure returned to baseline Postop Assessment: no apparent nausea or vomiting Anesthetic complications: no   No notable events documented.  Last Vitals:  Vitals:   01/22/24 0905 01/22/24 0910  BP:  130/75  Pulse: 63 (!) 58  Resp: (!) 9 (!) 9  Temp:    SpO2: 100% 100%    Last Pain:  Vitals:   01/22/24 0910  TempSrc:   PainSc: 0-No pain                 Somer Trotter,W. EDMOND

## 2024-01-22 NOTE — Interval H&P Note (Signed)
 History and Physical Interval Note: 63/female for EGD with banding for history of cirrhosis.  01/22/2024 7:38 AM  Lori Oconnell  has presented today for EGD with possible banding with the diagnosis of K 70.30 Alcoholic cirrhosis without ascites.  The various methods of treatment have been discussed with the patient and family. After consideration of risks, benefits and other options for treatment, the patient has consented to  Procedure(s): EGD (ESOPHAGOGASTRODUODENOSCOPY) (N/A) ESOPHAGOSCOPY, WITH ESOPHAGEAL VARICES BAND LIGATION (N/A) as a surgical intervention.  The patient's history has been reviewed, patient examined, no change in status, stable for surgery.  I have reviewed the patient's chart and labs.  Questions were answered to the patient's satisfaction.     Lori Oconnell

## 2024-02-13 ENCOUNTER — Other Ambulatory Visit: Payer: Self-pay | Admitting: Internal Medicine

## 2024-02-13 DIAGNOSIS — Z1231 Encounter for screening mammogram for malignant neoplasm of breast: Secondary | ICD-10-CM

## 2024-02-22 ENCOUNTER — Other Ambulatory Visit: Payer: Self-pay | Admitting: Internal Medicine

## 2024-02-22 NOTE — Telephone Encounter (Signed)
 Copied from CRM #[id number]. Topic: Clinical - Medication Refill >> Feb 22, 2024 12:48 PM [place] A wrote: Medication: amLODipine  (NORVASC ) 10 MG tablet  Has the patient contacted their pharmacy? No (Agent: If no, request that the patient contact the pharmacy for the refill. If patient does not wish to contact the pharmacy document the reason why and proceed with request.) (Agent: If yes, when and what did the pharmacy advise?)  This is the patient's preferred pharmacy:  [facility] [phone] [address], [place] - [address] [place] [address] Phone: [phone] Fax: [phone]   Is this the correct pharmacy for this prescription? Yes If no, delete pharmacy and type the correct one.   Has the prescription been filled recently? No  Is the patient out of the medication? Yes  Has the patient been seen for an appointment in the last year OR does the patient have an upcoming appointment? Yes  Can we respond through MyChart? Yes  Agent: Please be advised that Rx refills may take up to 3 business days. We ask that you follow-up with your pharmacy.

## 2024-03-24 ENCOUNTER — Ambulatory Visit
Admission: RE | Admit: 2024-03-24 | Discharge: 2024-03-24 | Disposition: A | Discharge status: Home / Self Care | Source: Ambulatory Visit | Attending: Internal Medicine | Admitting: Internal Medicine

## 2024-03-24 DIAGNOSIS — Z1231 Encounter for screening mammogram for malignant neoplasm of breast: Secondary | ICD-10-CM

## 2024-05-29 ENCOUNTER — Ambulatory Visit: Payer: Self-pay | Admitting: Internal Medicine

## 2024-05-29 ENCOUNTER — Encounter: Payer: Self-pay | Admitting: Internal Medicine

## 2024-05-29 ENCOUNTER — Ambulatory Visit: Admitting: Internal Medicine

## 2024-05-29 VITALS — BP 130/72 | HR 70 | Temp 98.4°F | Ht 64.0 in | Wt 207.0 lb

## 2024-05-29 DIAGNOSIS — F17200 Nicotine dependence, unspecified, uncomplicated: Secondary | ICD-10-CM | POA: Diagnosis not present

## 2024-05-29 DIAGNOSIS — I10 Essential (primary) hypertension: Secondary | ICD-10-CM | POA: Diagnosis not present

## 2024-05-29 DIAGNOSIS — R739 Hyperglycemia, unspecified: Secondary | ICD-10-CM

## 2024-05-29 DIAGNOSIS — E7800 Pure hypercholesterolemia, unspecified: Secondary | ICD-10-CM | POA: Diagnosis not present

## 2024-05-29 DIAGNOSIS — J309 Allergic rhinitis, unspecified: Secondary | ICD-10-CM | POA: Diagnosis not present

## 2024-05-29 DIAGNOSIS — E559 Vitamin D deficiency, unspecified: Secondary | ICD-10-CM

## 2024-05-29 LAB — BASIC METABOLIC PANEL WITH GFR
BUN: 12 mg/dL (ref 6–23)
CO2: 28 meq/L (ref 19–32)
Calcium: 9.7 mg/dL (ref 8.4–10.5)
Chloride: 106 meq/L (ref 96–112)
Creatinine, Ser: 0.9 mg/dL (ref 0.40–1.20)
GFR: 68.93 mL/min (ref >60.00)
Glucose, Bld: 101 mg/dL — ABNORMAL HIGH (ref 70–99)
Potassium: 4 meq/L (ref 3.5–5.1)
Sodium: 141 meq/L (ref 135–145)

## 2024-05-29 LAB — LIPID PANEL
Cholesterol: 190 mg/dL (ref 28–200)
HDL: 70.2 mg/dL (ref >39.00)
LDL Cholesterol: 103 mg/dL — ABNORMAL HIGH (ref 10–99)
NonHDL: 120.2
Total CHOL/HDL Ratio: 3
Triglycerides: 88 mg/dL (ref 10.0–149.0)
VLDL: 17.6 mg/dL (ref 0.0–40.0)

## 2024-05-29 LAB — CBC WITH DIFFERENTIAL/PLATELET
Basophils Absolute: 0 K/uL (ref 0.0–0.1)
Basophils Relative: 0.4 % (ref 0.0–3.0)
Eosinophils Absolute: 0.4 K/uL (ref 0.0–0.7)
Eosinophils Relative: 7.2 % — ABNORMAL HIGH (ref 0.0–5.0)
HCT: 36.1 % (ref 36.0–46.0)
Hemoglobin: 12.4 g/dL (ref 12.0–15.0)
Lymphocytes Relative: 34.3 % (ref 12.0–46.0)
Lymphs Abs: 2 K/uL (ref 0.7–4.0)
MCHC: 34.3 g/dL (ref 30.0–36.0)
MCV: 95.6 fl (ref 78.0–100.0)
Monocytes Absolute: 0.5 K/uL (ref 0.1–1.0)
Monocytes Relative: 8.8 % (ref 3.0–12.0)
Neutro Abs: 2.9 K/uL (ref 1.4–7.7)
Neutrophils Relative %: 49.3 % (ref 43.0–77.0)
Platelets: 185 K/uL (ref 150.0–400.0)
RBC: 3.78 Mil/uL — ABNORMAL LOW (ref 3.87–5.11)
RDW: 13.1 % (ref 11.5–15.5)
WBC: 5.9 K/uL (ref 4.0–10.5)

## 2024-05-29 LAB — URINALYSIS, ROUTINE W REFLEX MICROSCOPIC
Bilirubin Urine: NEGATIVE
Hgb urine dipstick: NEGATIVE
Ketones, ur: NEGATIVE
Leukocytes,Ua: NEGATIVE
Nitrite: NEGATIVE
RBC / HPF: NONE SEEN (ref 0–?)
Specific Gravity, Urine: 1.01 (ref 1.000–1.030)
Total Protein, Urine: NEGATIVE
Urine Glucose: NEGATIVE
Urobilinogen, UA: 0.2 (ref 0.0–1.0)
pH: 6 (ref 5.0–8.0)

## 2024-05-29 LAB — HEPATIC FUNCTION PANEL
ALT: 11 U/L (ref 3–35)
AST: 16 U/L (ref 5–37)
Albumin: 4.5 g/dL (ref 3.5–5.2)
Alkaline Phosphatase: 112 U/L (ref 39–117)
Bilirubin, Direct: 0.2 mg/dL (ref 0.1–0.3)
Total Bilirubin: 0.9 mg/dL (ref 0.2–1.2)
Total Protein: 7.5 g/dL (ref 6.0–8.3)

## 2024-05-29 LAB — TSH: TSH: 1.34 u[IU]/mL (ref 0.35–5.50)

## 2024-05-29 LAB — HEMOGLOBIN A1C: Hgb A1c MFr Bld: 6.1 % (ref 4.6–6.5)

## 2024-05-29 NOTE — Patient Instructions (Signed)
 Ok to double up on the Vitamin D3 that you take now  Please continue all other medications as before, and refills have been done if requested.  Please have the pharmacy call with any other refills you may need.  Please continue your efforts at being more active, low cholesterol diet, and weight control.  Please keep your appointments with your specialists as you may have planned  Please go to the LAB at the blood drawing area for the tests to be done  You will be contacted by phone if any changes need to be made immediately.  Otherwise, you will receive a letter about your results with an explanation, but please check with MyChart first.  Please make an Appointment to return in 6 months, or sooner if needed

## 2024-05-29 NOTE — Progress Notes (Unsigned)
 Patient ID: Lori Oconnell, female   DOB: 06-Jan-1961, 63 y.o.   MRN: [id number]        Chief Complaint: follow up HTN, HLD and hyperglycemia ***       HPI:  Lori Oconnell is a 63 y.o. female here with c/o        Wt Readings from Last 3 Encounters:  05/29/24 207 lb (93.9 kg)  01/22/24 199 lb 15.3 oz (90.7 kg)  11/28/23 204 lb (92.5 kg)   BP Readings from Last 3 Encounters:  05/29/24 130/72  01/22/24 130/75  11/28/23 (!) 148/86         Past Medical History:  Diagnosis Date   Anxiety    DISC DISEASE, CERVICAL 07/13/2009   Edema 03/22/2011   HEMATOCHEZIA 11/30/2009   HYPERLIPIDEMIA 07/18/2009   HYPERTENSION 07/12/2009   Irregular menses 03/22/2011   NECK PAIN 07/12/2009   OVERACTIVE BLADDER 07/12/2009   RENAL INSUFFICIENCY 07/12/2009   SINUSITIS- ACUTE-NOS 07/12/2009   Sleep apnea    prescribed cpap but has not gotten   TRANSAMINASES, SERUM, ELEVATED 07/12/2009   Trichomonal vaginitis 01/12/2022   Past Surgical History:  Procedure Laterality Date   COLONOSCOPY WITH PROPOFOL  N/A 01/11/2021   Procedure: COLONOSCOPY WITH PROPOFOL ;  Surgeon: Saintclair Jasper, MD;  Location: [facility];  Service: Gastroenterology;  Laterality: N/A;   ESOPHAGOGASTRODUODENOSCOPY N/A 01/22/2024   Procedure: EGD (ESOPHAGOGASTRODUODENOSCOPY);  Surgeon: Saintclair Jasper, MD;  Location: [facility];  Service: Gastroenterology;  Laterality: N/A;   ESOPHAGOGASTRODUODENOSCOPY (EGD) WITH PROPOFOL  N/A 01/11/2021   Procedure: ESOPHAGOGASTRODUODENOSCOPY (EGD) WITH PROPOFOL ;  Surgeon: Saintclair Jasper, MD;  Location: [facility];  Service: Gastroenterology;  Laterality: N/A;   MASS EXCISION N/A 08/27/2012   Procedure: EXCISION MASS UPPER BACK;  Surgeon: Debby LABOR. Cornett, MD;  Location: [facility];  Service: General;  Laterality: N/A;   POLYPECTOMY  01/11/2021   Procedure: POLYPECTOMY;  Surgeon: Saintclair Jasper, MD;  Location: [facility];  Service: Gastroenterology;;   s/p ganglion cyst Right 1980   TUBAL LIGATION  1990    reports that she has been  smoking cigarettes. She has a 1.3 pack-year smoking history. She has quit using smokeless tobacco.  Her smokeless tobacco use included chew. She reports that she does not currently use alcohol after a past usage of about 3.0 standard drinks of alcohol per week. She reports that she does not use drugs. family history includes Diabetes in her maternal aunt; Hypertension in her mother; Kidney failure in her mother. Allergies[1] Medications Ordered Prior to Encounter[2]      ROS:  All others reviewed and negative.  Objective        PE:  BP 130/72 (BP Location: Right Arm, Patient Position: Sitting, Cuff Size: Normal)   Pulse 70   Temp 98.4 F (36.9 C) (Oral)   Ht 5' 4 (1.626 m)   Wt 207 lb (93.9 kg)   LMP 03/19/2011   SpO2 99%   BMI 35.53 kg/m                 Constitutional: Pt appears in NAD               HENT: Head: NCAT.                Right Ear: External ear normal.                 Left Ear: External ear normal.                Eyes: . Pupils are  equal, round, and reactive to light. Conjunctivae and EOM are normal               Nose: without d/c or deformity               Neck: Neck supple. Gross normal ROM               Cardiovascular: Normal rate and regular rhythm.                 Pulmonary/Chest: Effort normal and breath sounds without rales or wheezing.                Abd:  Soft, NT, ND, + BS, no organomegaly               Neurological: Pt is alert. At baseline orientation, motor grossly intact               Skin: Skin is warm. No rashes, no other new lesions, LE edema - ***               Psychiatric: Pt behavior is normal without agitation   Micro: none  Cardiac tracings I have personally interpreted today:  none  Pertinent Radiological findings (summarize): none   Lab Results  Component Value Date   WBC 6.6 11/28/2023   HGB 12.1 11/28/2023   HCT 35.3 (L) 11/28/2023   PLT 223.0 11/28/2023   GLUCOSE 86 11/28/2023   CHOL 181 11/28/2023   TRIG 110.0 11/28/2023   HDL  61.20 11/28/2023   LDLDIRECT 119.0 11/04/2015   LDLCALC 98 11/28/2023   ALT 13 11/28/2023   AST 18 11/28/2023   NA 140 11/28/2023   K 3.9 11/28/2023   CL 106 11/28/2023   CREATININE 0.89 11/28/2023   BUN 11 11/28/2023   CO2 30 11/28/2023   TSH 1.24 11/28/2023   INR 1.9 (H) 10/27/2020   HGBA1C 6.0 11/28/2023   Assessment/Plan:  Lori Oconnell is a 63 y.o. Black or African American [2] female with  has a past medical history of Anxiety, DISC DISEASE, CERVICAL (07/13/2009), Edema (03/22/2011), HEMATOCHEZIA (11/30/2009), HYPERLIPIDEMIA (07/18/2009), HYPERTENSION (07/12/2009), Irregular menses (03/22/2011), NECK PAIN (07/12/2009), OVERACTIVE BLADDER (07/12/2009), RENAL INSUFFICIENCY (07/12/2009), SINUSITIS- ACUTE-NOS (07/12/2009), Sleep apnea, TRANSAMINASES, SERUM, ELEVATED (07/12/2009), and Trichomonal vaginitis (01/12/2022).  No problem-specific Assessment & Plan notes found for this encounter.  Followup: No follow-ups on file.  Lynwood Rush, MD 05/29/2024 9:34 AM [facility]    [1] No Known Allergies [2]  Current Outpatient Medications on File Prior to Visit  Medication Sig Dispense Refill   amLODipine  (NORVASC ) 10 MG tablet Take 1 tablet (10 mg total) by mouth daily. 90 tablet 3   Vitamin D , Ergocalciferol , (DRISDOL ) 1.25 MG (50000 UT) CAPS capsule Take 1 capsule (50,000 Units total) by mouth every 7 (seven) days. 12 capsule 0   betamethasone  dipropionate 0.05 % cream Apply topically 2 (two) times daily. (Patient not taking: Reported on 05/29/2024) 30 g 1   phentermine  30 MG capsule Take 1 capsule (30 mg total) by mouth every morning. (Patient not taking: Reported on 05/29/2024) 90 capsule 1   predniSONE  (DELTASONE ) 10 MG tablet 2 tabs by mouth per day for 5 days (Patient not taking: Reported on 05/29/2024) 10 tablet 0   No current facility-administered medications on file prior to visit.

## 2024-05-29 NOTE — Progress Notes (Signed)
 The test results show that your current treatment is OK, as the tests are stable.  Please continue the same plan.  There is no other need for change of treatment or further evaluation based on these results, at this time.  thanks

## 2024-05-31 ENCOUNTER — Encounter: Payer: Self-pay | Admitting: Internal Medicine

## 2024-05-31 NOTE — Assessment & Plan Note (Signed)
 Mild to mod, for otc allegra and/or nasacort asd,  to f/u any worsening symptoms or concerns

## 2024-05-31 NOTE — Assessment & Plan Note (Signed)
 Pt counsled to quit, pt not ready

## 2024-05-31 NOTE — Assessment & Plan Note (Signed)
 BP Readings from Last 3 Encounters:  05/29/24 130/72  01/22/24 130/75  11/28/23 (!) 148/86   Stable, pt to continue medical treatment norvasc  10 qd

## 2024-05-31 NOTE — Assessment & Plan Note (Signed)
 Lab Results  Component Value Date   HGBA1C 6.1 05/29/2024   Stable, pt to continue current medical treatment  - diet, wt control

## 2024-05-31 NOTE — Assessment & Plan Note (Signed)
 Lab Results  Component Value Date   LDLCALC 103 (H) 05/29/2024   uncontrolled, pt for lower chol diet, declines statin for now

## 2024-05-31 NOTE — Assessment & Plan Note (Addendum)
 Last vitamin D  Lab Results  Component Value Date   VD25OH 36.87 11/28/2023   Low, to increaed oral replacement D3 to 2000 u qd

## 2024-10-09 ENCOUNTER — Ambulatory Visit: Admitting: Nurse Practitioner

## 2024-11-25 ENCOUNTER — Ambulatory Visit
